# Patient Record
Sex: Female | Born: 1935 | Race: White | Hispanic: No | Smoking: Former smoker
Health system: Southern US, Community
[De-identification: ages and names within clinical notes are randomized; demographics above are authoritative.]

## PROBLEM LIST (undated history)

## (undated) DIAGNOSIS — D649 Anemia, unspecified: Secondary | ICD-10-CM

## (undated) DIAGNOSIS — J449 Chronic obstructive pulmonary disease, unspecified: Secondary | ICD-10-CM

## (undated) DIAGNOSIS — K219 Gastro-esophageal reflux disease without esophagitis: Secondary | ICD-10-CM

## (undated) DIAGNOSIS — K634 Enteroptosis: Secondary | ICD-10-CM

## (undated) DIAGNOSIS — I7300 Raynaud's syndrome without gangrene: Secondary | ICD-10-CM

## (undated) DIAGNOSIS — F419 Anxiety disorder, unspecified: Secondary | ICD-10-CM

## (undated) DIAGNOSIS — C3410 Malignant neoplasm of upper lobe, unspecified bronchus or lung: Secondary | ICD-10-CM

## (undated) HISTORY — DX: Anemia, unspecified: D64.9

## (undated) HISTORY — DX: Chronic obstructive pulmonary disease, unspecified: J44.9

## (undated) HISTORY — PX: JOINT REPLACEMENT: SHX530

## (undated) HISTORY — DX: Malignant neoplasm of upper lobe, unspecified bronchus or lung: C34.10

## (undated) HISTORY — PX: ABDOMINAL HYSTERECTOMY: SHX81

## (undated) HISTORY — DX: Raynaud's syndrome without gangrene: I73.00

## (undated) HISTORY — PX: CHOLECYSTECTOMY: SHX55

---

## 1995-12-08 DIAGNOSIS — C3410 Malignant neoplasm of upper lobe, unspecified bronchus or lung: Secondary | ICD-10-CM

## 1995-12-08 HISTORY — PX: LUNG SURGERY: SHX703

## 1995-12-08 HISTORY — DX: Malignant neoplasm of upper lobe, unspecified bronchus or lung: C34.10

## 2005-04-03 ENCOUNTER — Ambulatory Visit: Payer: Self-pay

## 2006-01-05 ENCOUNTER — Ambulatory Visit: Payer: Self-pay | Admitting: Specialist

## 2007-01-18 ENCOUNTER — Ambulatory Visit: Payer: Self-pay | Admitting: Internal Medicine

## 2007-07-28 ENCOUNTER — Ambulatory Visit: Payer: Self-pay | Admitting: Rheumatology

## 2007-08-11 ENCOUNTER — Ambulatory Visit: Payer: Self-pay | Admitting: Gastroenterology

## 2008-12-01 ENCOUNTER — Inpatient Hospital Stay: Payer: Self-pay | Admitting: Specialist

## 2009-03-07 ENCOUNTER — Ambulatory Visit: Payer: Self-pay | Admitting: Oncology

## 2009-03-13 ENCOUNTER — Ambulatory Visit: Payer: Self-pay | Admitting: Internal Medicine

## 2009-04-06 ENCOUNTER — Ambulatory Visit: Payer: Self-pay | Admitting: Oncology

## 2010-03-18 ENCOUNTER — Ambulatory Visit: Payer: Self-pay | Admitting: Internal Medicine

## 2010-06-11 ENCOUNTER — Ambulatory Visit: Payer: Self-pay | Admitting: Internal Medicine

## 2011-08-14 ENCOUNTER — Emergency Department: Payer: Self-pay | Admitting: Emergency Medicine

## 2012-08-23 ENCOUNTER — Ambulatory Visit: Payer: Self-pay | Admitting: Hematology and Oncology

## 2012-08-25 ENCOUNTER — Ambulatory Visit: Payer: Self-pay | Admitting: Rheumatology

## 2012-08-30 ENCOUNTER — Ambulatory Visit: Payer: Self-pay | Admitting: Hematology and Oncology

## 2012-08-30 LAB — CBC CANCER CENTER
Basophil %: 1 %
Eosinophil %: 2 %
MCH: 35 pg — ABNORMAL HIGH (ref 26.0–34.0)
MCHC: 32 g/dL (ref 32.0–36.0)
Monocyte #: 0.6 x10 3/mm (ref 0.2–0.9)
Neutrophil #: 6.3 x10 3/mm (ref 1.4–6.5)
Neutrophil %: 67.3 %
Platelet: 165 x10 3/mm (ref 150–440)
RBC: 3.36 10*6/uL — ABNORMAL LOW (ref 3.80–5.20)
RDW: 13.7 % (ref 11.5–14.5)

## 2012-08-30 LAB — BASIC METABOLIC PANEL
Anion Gap: 7 (ref 7–16)
BUN: 27 mg/dL — ABNORMAL HIGH (ref 7–18)
Chloride: 100 mmol/L (ref 98–107)
Co2: 32 mmol/L (ref 21–32)
Creatinine: 1.11 mg/dL (ref 0.60–1.30)
EGFR (African American): 53 — ABNORMAL LOW
EGFR (Non-African Amer.): 46 — ABNORMAL LOW
Osmolality: 283 (ref 275–301)

## 2012-09-01 LAB — PROT IMMUNOELECTROPHORES(ARMC)

## 2012-09-06 ENCOUNTER — Ambulatory Visit: Payer: Self-pay | Admitting: Hematology and Oncology

## 2013-01-04 ENCOUNTER — Ambulatory Visit: Payer: Self-pay | Admitting: Hematology and Oncology

## 2013-01-07 ENCOUNTER — Ambulatory Visit: Payer: Self-pay | Admitting: Hematology and Oncology

## 2013-01-11 LAB — FERRITIN: Ferritin (ARMC): 179 ng/mL (ref 8–388)

## 2013-02-04 ENCOUNTER — Ambulatory Visit: Payer: Self-pay | Admitting: Hematology and Oncology

## 2013-10-22 ENCOUNTER — Inpatient Hospital Stay: Payer: Self-pay | Admitting: Internal Medicine

## 2013-10-22 LAB — CK TOTAL AND CKMB (NOT AT ARMC)
CK, Total: 58 U/L (ref 21–215)
CK, Total: 47 U/L (ref 21–215)
CK, Total: 51 U/L (ref 21–215)
CK-MB: 0.9 ng/mL (ref 0.5–3.6)
CK-MB: 1.4 ng/mL (ref 0.5–3.6)
CK-MB: 1.1 ng/mL (ref 0.5–3.6)

## 2013-10-22 LAB — COMPREHENSIVE METABOLIC PANEL
Alkaline Phosphatase: 98 U/L (ref 50–136)
Anion Gap: 3 — ABNORMAL LOW (ref 7–16)
Calcium, Total: 9.3 mg/dL (ref 8.5–10.1)
Chloride: 100 mmol/L (ref 98–107)
Co2: 34 mmol/L — ABNORMAL HIGH (ref 21–32)
EGFR (African American): >60
EGFR (Non-African Amer.): >60
Osmolality: 282 (ref 275–301)
Potassium: 4.3 mmol/L (ref 3.5–5.1)
SGPT (ALT): 23 U/L (ref 12–78)
Sodium: 137 mmol/L (ref 136–145)
Total Protein: 6.9 g/dL (ref 6.4–8.2)

## 2013-10-22 LAB — TROPONIN I
Troponin-I: 0.03 ng/mL
Troponin-I: 0.02 ng/mL

## 2013-10-22 LAB — CBC
Platelet: 180 10*3/uL (ref 150–440)
RBC: 2.45 10*6/uL — ABNORMAL LOW (ref 3.80–5.20)
WBC: 15.8 10*3/uL — ABNORMAL HIGH (ref 3.6–11.0)

## 2013-10-22 LAB — PRO B NATRIURETIC PEPTIDE: B-Type Natriuretic Peptide: 739 pg/mL — ABNORMAL HIGH (ref 0–450)

## 2013-10-22 LAB — APTT: Activated PTT: 38.4 secs — ABNORMAL HIGH (ref 23.6–35.9)

## 2013-10-23 LAB — CBC WITH DIFFERENTIAL/PLATELET
Basophil #: 0.1 10*3/uL (ref 0.0–0.1)
Basophil %: 0.6 %
Eosinophil #: 0 10*3/uL (ref 0.0–0.7)
Eosinophil %: 0 %
HCT: 23.3 % — ABNORMAL LOW (ref 35.0–47.0)
Lymphocyte #: 0.6 10*3/uL — ABNORMAL LOW (ref 1.0–3.6)
Lymphocyte %: 4.8 %
MCH: 35.8 pg — ABNORMAL HIGH (ref 26.0–34.0)
MCHC: 33 g/dL (ref 32.0–36.0)
MCV: 108 fL — ABNORMAL HIGH (ref 80–100)
Monocyte #: 0.2 x10 3/mm (ref 0.2–0.9)
Monocyte %: 1.7 %
Neutrophil #: 11.9 10*3/uL — ABNORMAL HIGH (ref 1.4–6.5)
Neutrophil %: 92.9 %
Platelet: 165 10*3/uL (ref 150–440)
RBC: 2.15 10*6/uL — ABNORMAL LOW (ref 3.80–5.20)

## 2013-10-23 LAB — BASIC METABOLIC PANEL
Anion Gap: 4 — ABNORMAL LOW (ref 7–16)
Chloride: 99 mmol/L (ref 98–107)
Co2: 32 mmol/L (ref 21–32)
Creatinine: 0.89 mg/dL (ref 0.60–1.30)
EGFR (African American): >60
EGFR (Non-African Amer.): 59 — ABNORMAL LOW
Glucose: 202 mg/dL — ABNORMAL HIGH (ref 65–99)
Osmolality: 283 (ref 275–301)
Potassium: 3.9 mmol/L (ref 3.5–5.1)
Sodium: 135 mmol/L — ABNORMAL LOW (ref 136–145)

## 2013-10-24 LAB — CBC WITH DIFFERENTIAL/PLATELET
Basophil #: 0 10*3/uL (ref 0.0–0.1)
Basophil %: 0.2 %
Eosinophil #: 0 10*3/uL (ref 0.0–0.7)
Eosinophil %: 0 %
HCT: 22.7 % — ABNORMAL LOW (ref 35.0–47.0)
HGB: 7.6 g/dL — ABNORMAL LOW (ref 12.0–16.0)
Lymphocyte #: 0.8 10*3/uL — ABNORMAL LOW (ref 1.0–3.6)
MCV: 106 fL — ABNORMAL HIGH (ref 80–100)
Monocyte %: 2.5 %
Neutrophil #: 22.9 10*3/uL — ABNORMAL HIGH (ref 1.4–6.5)
Neutrophil %: 94 %
WBC: 24.3 10*3/uL — ABNORMAL HIGH (ref 3.6–11.0)

## 2013-10-24 LAB — BASIC METABOLIC PANEL
Calcium, Total: 8.7 mg/dL (ref 8.5–10.1)
Chloride: 97 mmol/L — ABNORMAL LOW (ref 98–107)
EGFR (African American): >60
EGFR (Non-African Amer.): >60
Potassium: 4.2 mmol/L (ref 3.5–5.1)
Sodium: 132 mmol/L — ABNORMAL LOW (ref 136–145)

## 2013-10-25 LAB — CBC WITH DIFFERENTIAL/PLATELET
Basophil %: 0.4 %
Eosinophil #: 0 10*3/uL (ref 0.0–0.7)
Eosinophil %: 0.1 %
HCT: 23.3 % — ABNORMAL LOW (ref 35.0–47.0)
MCH: 35.3 pg — ABNORMAL HIGH (ref 26.0–34.0)
MCHC: 33 g/dL (ref 32.0–36.0)
MCV: 107 fL — ABNORMAL HIGH (ref 80–100)
Monocyte #: 1 x10 3/mm — ABNORMAL HIGH (ref 0.2–0.9)
Platelet: 172 10*3/uL (ref 150–440)
RBC: 2.18 10*6/uL — ABNORMAL LOW (ref 3.80–5.20)
RDW: 13.8 % (ref 11.5–14.5)
WBC: 17.2 10*3/uL — ABNORMAL HIGH (ref 3.6–11.0)

## 2013-10-25 LAB — BASIC METABOLIC PANEL
Anion Gap: 5 — ABNORMAL LOW (ref 7–16)
Chloride: 98 mmol/L (ref 98–107)
Co2: 31 mmol/L (ref 21–32)
EGFR (Non-African Amer.): >60
Glucose: 109 mg/dL — ABNORMAL HIGH (ref 65–99)
Sodium: 134 mmol/L — ABNORMAL LOW (ref 136–145)

## 2013-10-26 LAB — CBC WITH DIFFERENTIAL/PLATELET
Basophil #: 0 10*3/uL (ref 0.0–0.1)
Eosinophil #: 0 10*3/uL (ref 0.0–0.7)
Eosinophil %: 0 %
Lymphocyte #: 0.8 10*3/uL — ABNORMAL LOW (ref 1.0–3.6)
MCV: 105 fL — ABNORMAL HIGH (ref 80–100)
Monocyte #: 0.8 x10 3/mm (ref 0.2–0.9)
Neutrophil #: 11.7 10*3/uL — ABNORMAL HIGH (ref 1.4–6.5)
RDW: 13.6 % (ref 11.5–14.5)
WBC: 13.4 10*3/uL — ABNORMAL HIGH (ref 3.6–11.0)

## 2013-10-26 LAB — BASIC METABOLIC PANEL
Calcium, Total: 8.9 mg/dL (ref 8.5–10.1)
Chloride: 97 mmol/L — ABNORMAL LOW (ref 98–107)
EGFR (African American): >60
EGFR (Non-African Amer.): 60 — ABNORMAL LOW
Glucose: 113 mg/dL — ABNORMAL HIGH (ref 65–99)
Osmolality: 273 (ref 275–301)
Potassium: 4.3 mmol/L (ref 3.5–5.1)
Sodium: 133 mmol/L — ABNORMAL LOW (ref 136–145)

## 2013-10-27 LAB — CULTURE, BLOOD (SINGLE)

## 2014-07-20 ENCOUNTER — Ambulatory Visit: Payer: Self-pay | Admitting: Vascular Surgery

## 2014-08-29 ENCOUNTER — Ambulatory Visit: Payer: Self-pay | Admitting: Vascular Surgery

## 2014-08-29 LAB — CBC
HCT: 25.8 % — AB (ref 35.0–47.0)
HGB: 8.7 g/dL — ABNORMAL LOW (ref 12.0–16.0)
MCH: 37.9 pg — ABNORMAL HIGH (ref 26.0–34.0)
MCHC: 33.8 g/dL (ref 32.0–36.0)
MCV: 112 fL — ABNORMAL HIGH (ref 80–100)
Platelet: 137 10*3/uL — ABNORMAL LOW (ref 150–440)
RBC: 2.3 10*6/uL — ABNORMAL LOW (ref 3.80–5.20)
RDW: 13.7 % (ref 11.5–14.5)
WBC: 10.4 10*3/uL (ref 3.6–11.0)

## 2014-08-29 LAB — APTT: Activated PTT: 29.1 secs (ref 23.6–35.9)

## 2014-08-29 LAB — URINALYSIS, COMPLETE
BLOOD: NEGATIVE
Bilirubin,UR: NEGATIVE
Glucose,UR: NEGATIVE mg/dL (ref 0–75)
Ketone: NEGATIVE
Nitrite: NEGATIVE
Ph: 7 (ref 4.5–8.0)
Protein: NEGATIVE
RBC,UR: 2 /HPF (ref 0–5)
SPECIFIC GRAVITY: 1.018 (ref 1.003–1.030)
Squamous Epithelial: 2

## 2014-08-29 LAB — MRSA PCR SCREENING

## 2014-08-29 LAB — PROTIME-INR
INR: 1.1
PROTHROMBIN TIME: 13.7 s (ref 11.5–14.7)

## 2014-08-29 LAB — BASIC METABOLIC PANEL
Anion Gap: 3 — ABNORMAL LOW (ref 7–16)
BUN: 33 mg/dL — AB (ref 7–18)
Calcium, Total: 8.7 mg/dL (ref 8.5–10.1)
Chloride: 101 mmol/L (ref 98–107)
Co2: 33 mmol/L — ABNORMAL HIGH (ref 21–32)
Creatinine: 0.95 mg/dL (ref 0.60–1.30)
EGFR (Non-African Amer.): 59 — ABNORMAL LOW
Glucose: 86 mg/dL (ref 65–99)
OSMOLALITY: 280 (ref 275–301)
Potassium: 4.5 mmol/L (ref 3.5–5.1)
Sodium: 137 mmol/L (ref 136–145)

## 2014-08-29 LAB — SEDIMENTATION RATE: Erythrocyte Sed Rate: 74 mm/hr — ABNORMAL HIGH (ref 0–30)

## 2014-09-05 ENCOUNTER — Inpatient Hospital Stay: Payer: Self-pay | Admitting: Vascular Surgery

## 2014-09-06 LAB — CBC WITH DIFFERENTIAL/PLATELET
Basophil #: 0.1 10*3/uL (ref 0.0–0.1)
Basophil %: 0.9 %
EOS PCT: 0.8 %
Eosinophil #: 0.1 10*3/uL (ref 0.0–0.7)
HCT: 21.3 % — ABNORMAL LOW (ref 35.0–47.0)
HGB: 7 g/dL — ABNORMAL LOW (ref 12.0–16.0)
Lymphocyte #: 1.3 10*3/uL (ref 1.0–3.6)
Lymphocyte %: 14.9 %
MCH: 37.3 pg — AB (ref 26.0–34.0)
MCHC: 33 g/dL (ref 32.0–36.0)
MCV: 113 fL — ABNORMAL HIGH (ref 80–100)
Monocyte #: 1 x10 3/mm — ABNORMAL HIGH (ref 0.2–0.9)
Monocyte %: 11.2 %
NEUTROS PCT: 72.2 %
Neutrophil #: 6.4 10*3/uL (ref 1.4–6.5)
Platelet: 125 10*3/uL — ABNORMAL LOW (ref 150–440)
RBC: 1.89 10*6/uL — ABNORMAL LOW (ref 3.80–5.20)
RDW: 13.8 % (ref 11.5–14.5)
WBC: 8.9 10*3/uL (ref 3.6–11.0)

## 2014-09-06 LAB — BASIC METABOLIC PANEL
ANION GAP: 5 — AB (ref 7–16)
BUN: 22 mg/dL — ABNORMAL HIGH (ref 7–18)
CHLORIDE: 103 mmol/L (ref 98–107)
CO2: 31 mmol/L (ref 21–32)
Calcium, Total: 7.8 mg/dL — ABNORMAL LOW (ref 8.5–10.1)
Creatinine: 0.79 mg/dL (ref 0.60–1.30)
EGFR (African American): >60
Glucose: 85 mg/dL (ref 65–99)
OSMOLALITY: 280 (ref 275–301)
Potassium: 4.2 mmol/L (ref 3.5–5.1)
Sodium: 139 mmol/L (ref 136–145)

## 2014-09-06 LAB — APTT: Activated PTT: 29.5 secs (ref 23.6–35.9)

## 2014-09-06 LAB — PROTIME-INR
INR: 1.2
PROTHROMBIN TIME: 14.9 s — AB (ref 11.5–14.7)

## 2014-09-07 LAB — PATHOLOGY REPORT

## 2015-03-29 NOTE — H&P (Signed)
PATIENT NAME:  Sara Mccoy, Sara Mccoy MR#:  [id number] DATE OF BIRTH:  [birth year]  DATE OF ADMISSION:  10/22/2013  REFERRING PHYSICIAN: Dr. Jacqualine Code.   FAMILY PHYSICIAN: Dr. Ginette Pitman   REASON FOR ADMISSION: Chest pain with shortness of breath.   HISTORY OF PRESENT ILLNESS: The patient is an 81 year old female with a history of scleroderma, chronic obstructive pulmonary disease and previous lung cancer. Presents to the Emergency Room with chest pain described as tightness associated with worsening shortness of breath. In the Emergency Room, the patient's EKG showed no acute changes. Her troponin was 0.03. She was given 3 SVNs with slight improvement of her symptoms. Chest x-ray suggested a left lower lobe pneumonia. She is now admitted for further evaluation.   PAST MEDICAL HISTORY: 1. Chronic obstructive pulmonary disease/asthma. 2. Lung cancer, status post right upper lobectomy.  3. Anemia of chronic disease.  4. Borderline diabetes. 5. Benign hypertension.  6. Carpal tunnel syndrome.  7. Scleroderma.  8. Osteoporosis.  9. Hyperlipidemia.  10. History of reflux esophagitis.  11. Status post hysterectomy.  12. Status post Nissan fundoplication.   MEDICATIONS: 1. Ambien 5 mg p.o. at bedtime.  2. Vitamin D3 1000 units p.o. daily.  3. Effexor-XR 75 mg p.o. daily.  4. Percocet 5/325, one p.o. t.i.d.  5. Prilosec 40 mg p.o. daily.  6. Lodine 400 mg p.o. b.i.d.  7. BuSpar 10 mg p.o. t.i.d.  8. Advair 250/50 1 puff b.i.d.  9. Norvasc 5 mg p.o. daily.   ALLERGIES: METHOTREXATE.   SOCIAL HISTORY: Negative for alcohol or tobacco abuse.   FAMILY HISTORY: Positive for stroke, but otherwise unremarkable.   REVIEW OF SYSTEMS:  CONSTITUTIONAL: No fever or change in weight.  EYES: No blurred or double vision. No glaucoma.  ENT: No tinnitus or hearing loss. No nasal discharge or bleeding. No difficulty swallowing.  RESPIRATORY: The patient has had cough and wheezing. No hemoptysis. No painful  respiration.  CARDIOVASCULAR: No orthopnea or palpitations. No syncope.  GASTROINTESTINAL: No nausea, vomiting, or diarrhea. No abdominal pain. No change in bowel habits.  GENITOURINARY: No dysuria or hematuria. No incontinence.  ENDOCRINE: No polyuria or polydipsia. No heat or cold intolerance.  HEMATOLOGIC: The patient admits to anemia but denies easy bruising or bleeding.  LYMPHATIC: No swollen glands.  MUSCULOSKELETAL: The patient denies pain in her neck, back, shoulders, knees, or hips. No gout.  NEUROLOGIC: No numbness or migraines. Denies stroke or seizures.  PSYCHIATRIC: The patient denies anxiety or depression. Does have issues with insomnia.   PHYSICAL EXAMINATION: GENERAL: The patient is chronically ill appearing, in no acute distress.  VITAL SIGNS: Vital signs are currently remarkable for a blood pressure of 112/62 with a heart rate of 82 and a respiratory rate of 20, with a temperature of 97.9. Sat is 100% on 2 liters.  HEENT: Normocephalic, atraumatic. Pupils equally round and reactive to light and accommodation. Extraocular movements are intact. Sclerae are nonicteric. Conjunctivae are clear.  OROPHARYNX: Dry, but clear.  NECK: Supple without JVD or bruits. No adenopathy or thyromegaly is noted.   LUNGS: Revealed scattered wheezes with rhonchi noted at the left base. No rales. Respiratory effort is mildly increased.  CARDIAC: Regular rate and rhythm. Normal S1, S2. No significant rubs, murmurs or gallops. PMI is nondisplaced. Chest wall is nontender.  ABDOMEN: Soft, nontender, with normoactive bowel sounds. No organomegaly or masses were appreciated. No hernias or bruits were noted.  EXTREMITIES: Without clubbing, cyanosis, or edema..  Pulses were 2+ bilaterally.  SKIN: Warm and  dry without rash or lesions.  NEUROLOGIC: Cranial nerves II through XII grossly intact. Deep tendon reflexes were symmetric. Motor and sensory exam is nonfocal.  PSYCHIATRIC: Revealed a patient who was  alert and oriented to person, place, and time. She was cooperative and used good judgment.   LABORATORY DATA: EKG revealed sinus rhythm with no acute ischemic changes. Chest x-ray revealed left lower lobe infiltrate consistent with pneumonia. White count was 15.8 with a hemoglobin of 8.9. Glucose was 89 with a BUN of 36 and a creatinine of 0.86 with a GFR of greater than 60. Sodium 137, with a potassium of 4.3. Troponin was 0.03.   ASSESSMENT: 1. Acute on chronic respiratory failure.  2. Chronic obstructive pulmonary disease exacerbation.  3. Pneumonia.  4. Chest pain, worrisome for new onset angina.  5. Anemia of chronic disease.  6. Scleroderma.   PLAN: The patient will be observed with off unit telemetry on oxygen, SVNs, IV steroids and IV antibiotics. We will follow serial cardiac enzymes and obtain an echocardiogram. We will follow up routine labs and a chest x-ray in the morning. We will follow her sugars while on steroids. We will continue most of her outpatient medications at this time. Soft diet for now. Further treatment and evaluation will depend upon the patient's progress.   TOTAL TIME SPENT ON THIS PATIENT: 45 minutes.   ____________________________ Leonie Douglas Doy Hutching, MD jds:sg D: 10/22/2013 13:27:00 ET T: 10/22/2013 13:53:38 ET JOB#: [id number]  cc: Leonie Douglas. Doy Hutching, MD, <Dictator> Tracie Harrier, MD  Veneda Kirksey Lennice Sites MD ELECTRONICALLY SIGNED 10/23/2013 8:03

## 2015-03-29 NOTE — Discharge Summary (Signed)
PATIENT NAME:  Sara Mccoy, Sara Mccoy MR#:  [id number] DATE OF BIRTH:  [birth year]  DATE OF ADMISSION:  10/22/2013 DATE OF DISCHARGE:  10/26/2013  DISCHARGE DIAGNOSIS:  1.  Acute on chronic respiratory failure. 2.  Chronic obstructive pulmonary disease exacerbation.  3.  Pneumonia.  4.  Scleroderma.  5.  Hypertension.  6.  Anxiety.   CHIEF COMPLAINT: Shortness of breath, chest pain.   HISTORY OF PRESENT ILLNESS: Sara Mccoy is an 81 year old female with a history scleroderma, COPD, and previous lung cancer who presented to the ER complaining of chest pain associated with tightness, worsening shortness of breath, and cough with productive sputum. Chest x-ray was suggestive of left lower lobe pneumonia. She was given SVNs and she improved to some extent.  PAST MEDICAL HISTORY: Significant for COPD, lung cancer status post right upper lobectomy, anemia of chronic disease, borderline diabetes, benign hypertension, carpal tunnel syndrome, scleroderma, osteoporosis, hyperlipidemia, history of reflux esophagitis status post Nissen fundoplication, and history of previous hysterectomy.   PHYSICAL EXAMINATION: The patient was mildly dyspneic at rest. Blood pressure 112/62, heart rate 82, respirations 20, temperature was 97.9, and O2 sat 100% on 2 liters nasal cannula. Pupils were equal and reactive to light. Oropharynx was clear. Neck was supple. No JVD. Heart S1, S2. Lungs with bilateral inspiratory and expiratory wheezes with crackles in the left base. Abdomen was soft, nontender. Extremities no edema. Neurologically nonfocal.  LABORATORY AND DIAGNOSTICS: EKG revealed sinus rhythm, no acute ischemic changes. Chest x-ray showed left lower lobe infiltrate consistent with pneumonia, WBC count 15.8, hemoglobin 8.9. Glucose 89, BUN 36, creatinine 0.86. GFR greater than 60. Sodium 137, potassium 4.3. Troponin was 0.03.   HOSPITAL COURSE: The patient was admitted to [facility] and received supplemental oxygen,  SVNs, IV steroids, and IV antibiotics. Serial cardiac enzymes were also obtained. She was started on intravenous Rocephin and Zithromax. During her stay in the hospital, her cardiac enzymes were negative. White cell count did go up as high as 24.3, which subsequently started to trend downwards. Her steroids were gradually tapered. She was seen by physical therapy. She was advised to have home health nursing and discharged in stable condition on the following medications.   DISCHARGE MEDICATIONS: 1.  Albuterol nebulizer 2.5 mg/3 mL 1 vial 3 to 4 times a day as needed.  2.  Ceftin 250 mg p.o. b.i.d. for 7 days. 3.  Prednisone taper starting at 40 mg a day for 3 days, decrease x 10 mg every 3 days until gone. 4.  Zolpidem 5 mg at bedtime p.r.n.  5.  Advair Diskus 250/50 one puff b.i.d. 6.  Omeprazole 40 mg once a day.  7.  Calcium plus D 1 tablet once a day. 8.  Centrum Silver 1 capsule once a day. 9.  Hydrocodone 1 tablet p.o. b.i.d. p.r.n. for pain.  10.  Vitamin D3 1000 units once a day. 11.  Vitamin E 400 international units once a day. 12.  B12 500 mcg sublingual once a day. 13.  Venlafaxine 75 mg once a day. 14.  Amlodipine 5 mg once a day. 15.  Guaifenesin 600 mg p.o. b.i.d. p.r.n.   DISCHARGE INSTRUCTIONS: Also advised home oxygen 2 liters nasal cannula. Home health nursing was arranged and she is to follow up with me, Dr. Ginette Pitman, in 2 to 4 weeks time.    Total time spent in discharge of this pt: 35 minutes ____________________________ Tracie Harrier, MD vh:sb D: 10/26/2013 13:03:37 ET T: 10/26/2013 13:16:40 ET JOB#: [id number]  cc:  Tracie Harrier, MD, <Dictator> Tracie Harrier MD ELECTRONICALLY SIGNED 10/27/2013 13:22

## 2015-03-30 NOTE — Op Note (Signed)
PATIENT NAME:  Sara Mccoy, Sara Mccoy MR#:  [id number] DATE OF BIRTH:  12-Jan-1926  DATE OF PROCEDURE:  09/05/2014  PREOPERATIVE DIAGNOSES: 1. Symptomatic right internal carotid artery stenosis.  2. Amaurosis fugax, right eye.   POSTOPERATIVE DIAGNOSES:  1. Symptomatic right internal carotid artery stenosis.  2. Amaurosis fugax, right eye.   PROCEDURE PERFORMED: 1. Right carotid endarterectomy with CorMatrix patch angioplasty.  2. Repair of arterial defect using xenograft Corometrics patch.   SURGEON:   Katha Cabal, M.D.  ANESTHESIA: General by endotracheal intubation.   FLUIDS: Per anesthesia record.   ESTIMATED BLOOD LOSS: 50 mL.   SPECIMEN: Carotid plaque to pathology for permanent section.   INDICATIONS: Sara Mccoy is an 81 year old woman who has had multiple symptomatic episodes attributable to her right internal carotid artery. Duplex ultrasound and subsequently CT angiography demonstrated a greater than 90% stenosis of the right internal carotid artery and she is therefore undergoing endarterectomy for prevention of disabling stroke. Risks and benefits have been reviewed. All questions answered. The patient agrees to proceed.   DESCRIPTION OF PROCEDURE: The patient was taken to the operating room and placed in the supine position. After adequate general anesthesia was induced and appropriate invasive monitors were placed, she was positioned supine with the neck extended slightly and rotated to the left. Right neck and chest wall were then prepped and draped in a sterile fashion.   Appropriate timeout was called.   A curvilinear incision was then created along the anterior margin of the sternocleidomastoid muscle and carried down through the skin transecting the platysma ligating external jugular vein with 2-0 silk ties. The sternocleidomastoid muscle was then reflected posterolaterally and at the level of the omohyoid muscle the common carotid artery was identified.  Dissection was then carried in a craniad direction, ligating tributaries between 2-0 and 3-0 silk ties. Facial vein was ligated and divided between two 2-0 silk ties. Vagus nerve was identified as was the hypoglossal. Circumferential dissection was carried out around the superior thyroidal artery and then the external carotid artery. Internal carotid artery was dissected to a level above visible plaque formation. Then 7000 units of heparin was given and allowed to circulate for 5 minutes.   The common carotid, followed by the external, followed by the internal carotid artery was then clamped. Arteriotomy was made, extended with Potts scissors, and an indwelling Sundt shunt was placed and flow was re-established to the brain.   Endarterectomy was then performed under direct visualization for the distal common bulb and internal carotid artery. The external carotid artery was treated with the eversion technique. Interrupted 7-0 Prolene sutures were used to tack the distal intimal edge within the internal carotid artery and at this time the internal carotid artery was plicated by approximately 5 mm.   CorMatrix patches opened on the back table and rehydrated.  It was then applied to fix the arterial defect as a patch using running 6-0 Prolene in a 4-quadrant technique. The surgical site was irrigated copiously with heparin throughout the application of the patch. The shunt was Removed, the suture line was completed, and subsequently flow was established, first to the external carotid artery and then the internal carotid artery to prevent distal embolization. Suture line was then inspected. Surgicel was placed. Evicel was placed in the posterior wound layer and the wound was closed after irrigation using 3-0 Vicryl in a running fashion for the platysma and 4-0 Monocryl subcuticular for the skin. Dermabond is applied.   The patient tolerated the procedure  well. She was awakened in the operating room following  simple commands, moving all extremities and she was taken to the recovery area in stable condition.    ____________________________ Katha Cabal, MD ggs:hh D: 09/05/2014 16:05:00 ET T: 09/05/2014 21:17:02 ET JOB#: [id number]  cc: Katha Cabal, MD, <Dictator> Katha Cabal MD ELECTRONICALLY SIGNED 09/24/2014 20:45

## 2016-01-27 ENCOUNTER — Inpatient Hospital Stay: Payer: PPO | Attending: Internal Medicine | Admitting: Internal Medicine

## 2016-01-27 ENCOUNTER — Inpatient Hospital Stay: Payer: PPO

## 2016-01-27 ENCOUNTER — Encounter: Payer: Self-pay | Admitting: Internal Medicine

## 2016-01-27 VITALS — BP 109/64 | HR 77 | Temp 97.9°F | Resp 18 | Ht 61.0 in | Wt 143.3 lb

## 2016-01-27 DIAGNOSIS — Z85118 Personal history of other malignant neoplasm of bronchus and lung: Secondary | ICD-10-CM | POA: Diagnosis not present

## 2016-01-27 DIAGNOSIS — I7300 Raynaud's syndrome without gangrene: Secondary | ICD-10-CM | POA: Insufficient documentation

## 2016-01-27 DIAGNOSIS — D539 Nutritional anemia, unspecified: Secondary | ICD-10-CM

## 2016-01-27 DIAGNOSIS — R5383 Other fatigue: Secondary | ICD-10-CM | POA: Diagnosis not present

## 2016-01-27 DIAGNOSIS — Z9981 Dependence on supplemental oxygen: Secondary | ICD-10-CM | POA: Insufficient documentation

## 2016-01-27 DIAGNOSIS — Z87891 Personal history of nicotine dependence: Secondary | ICD-10-CM | POA: Diagnosis not present

## 2016-01-27 DIAGNOSIS — D649 Anemia, unspecified: Secondary | ICD-10-CM | POA: Diagnosis not present

## 2016-01-27 DIAGNOSIS — R0602 Shortness of breath: Secondary | ICD-10-CM | POA: Insufficient documentation

## 2016-01-27 DIAGNOSIS — D472 Monoclonal gammopathy: Secondary | ICD-10-CM | POA: Diagnosis not present

## 2016-01-27 DIAGNOSIS — Z79899 Other long term (current) drug therapy: Secondary | ICD-10-CM | POA: Diagnosis not present

## 2016-01-27 DIAGNOSIS — J449 Chronic obstructive pulmonary disease, unspecified: Secondary | ICD-10-CM | POA: Diagnosis not present

## 2016-01-27 LAB — CBC WITH DIFFERENTIAL/PLATELET
BASOS ABS: 0.1 10*3/uL (ref 0–0.1)
BASOS PCT: 1 %
EOS PCT: 1 %
Eosinophils Absolute: 0.1 10*3/uL (ref 0–0.7)
HCT: 20.5 % — ABNORMAL LOW (ref 35.0–47.0)
Hemoglobin: 6.9 g/dL — ABNORMAL LOW (ref 12.0–16.0)
LYMPHS PCT: 19 %
Lymphs Abs: 1.6 10*3/uL (ref 1.0–3.6)
MCH: 38.3 pg — ABNORMAL HIGH (ref 26.0–34.0)
MCHC: 33.6 g/dL (ref 32.0–36.0)
MCV: 114 fL — AB (ref 80.0–100.0)
Monocytes Absolute: 0.7 10*3/uL (ref 0.2–0.9)
Monocytes Relative: 8 %
NEUTROS ABS: 6 10*3/uL (ref 1.4–6.5)
Neutrophils Relative %: 71 %
PLATELETS: 155 10*3/uL (ref 150–440)
RBC: 1.8 MIL/uL — AB (ref 3.80–5.20)
RDW: 15.2 % — ABNORMAL HIGH (ref 11.5–14.5)
WBC: 8.5 10*3/uL (ref 3.6–11.0)

## 2016-01-27 LAB — COMPREHENSIVE METABOLIC PANEL
ALBUMIN: 3.5 g/dL (ref 3.5–5.0)
ALK PHOS: 55 U/L (ref 38–126)
ALT: 17 U/L (ref 14–54)
ANION GAP: 3 — AB (ref 5–15)
AST: 19 U/L (ref 15–41)
BUN: 33 mg/dL — ABNORMAL HIGH (ref 6–20)
CALCIUM: 9.1 mg/dL (ref 8.9–10.3)
CHLORIDE: 97 mmol/L — AB (ref 101–111)
CO2: 33 mmol/L — AB (ref 22–32)
Creatinine, Ser: 1.1 mg/dL — ABNORMAL HIGH (ref 0.44–1.00)
GFR calc non Af Amer: 44 mL/min — ABNORMAL LOW (ref >60)
GFR, EST AFRICAN AMERICAN: 51 mL/min — AB (ref >60)
GLUCOSE: 117 mg/dL — AB (ref 65–99)
POTASSIUM: 4.8 mmol/L (ref 3.5–5.1)
SODIUM: 133 mmol/L — AB (ref 135–145)
Total Bilirubin: 0.8 mg/dL (ref 0.3–1.2)
Total Protein: 6.7 g/dL (ref 6.5–8.1)

## 2016-01-27 LAB — LACTATE DEHYDROGENASE: LDH: 166 U/L (ref 98–192)

## 2016-01-27 LAB — RETICULOCYTES
RBC.: 1.8 MIL/uL — AB (ref 3.80–5.20)
RETIC COUNT ABSOLUTE: 19.8 10*3/uL (ref 19.0–183.0)
RETIC CT PCT: 1.1 % (ref 0.4–3.1)

## 2016-01-27 LAB — SAMPLE TO BLOOD BANK

## 2016-01-27 LAB — PREPARE RBC (CROSSMATCH)

## 2016-01-27 LAB — ABO/RH: ABO/RH(D): O POS

## 2016-01-27 NOTE — Progress Notes (Signed)
Pt very tired, sob on exertion. No blood in stool or urine. Marland Kitchen She wears oxygen when she needs it. She does not bring it to appts because it is to heavy to bring and she feels that she does not need it and when she come to office she gets wheelchair.

## 2016-01-27 NOTE — Progress Notes (Signed)
[place] @ [facility] [phone]  Fax:([phone]) [place]: 03-14-1926  MR#: [id number]  [facility]#:[id number]  Patient Care Team: Tracie Harrier, MD as PCP - General (Internal Medicine)  CHIEF COMPLAINT:  Chief Complaint  Patient presents with  . Anemia     No history exists.    No flowsheet data found.  HISTORY OF PRESENT ILLNESS:   Mrs. Culbreth is an 82 year old female who was previously seen in our clinic, last time in 2014, who has a history of macrocytic anemia and monoclonal gammopathy of undetermined significance.a very extensive workup showed no evidence of advanced plasma cell dyscrasia, vitamin B12, folate, iron deficiency. There was a suggestion that she could have myelodysplastic syndrome, but no bone marrow biopsy was performed. Today she returns to our clinic with worsening shortness of breath, which significantly reduces her ability to perform the activities of daily living. On a regular basis she is able to perform self-care, cooking, light cleaning, but all the past several months she had been slowly declining due to worsening of shortness of breath and increasing fatigue.her vitamin B12, folate and ferritin levels are within normal range. She denies cough, chest pain, nausea, vomiting, bleeding from any source,night sweats, fever.she is only able to walk 50 feet, before she has to catch her breath. She uses oxygen at home.  REVIEW OF SYSTEMS:   ROS   PAST MEDICAL HISTORY: Past Medical History  Diagnosis Date  . COPD (chronic obstructive pulmonary disease) ([place])   . Anemia   . Raynaud's disease   . Lung cancer, upper lobe ([place]) 1997    right upper lobe    PAST SURGICAL HISTORY: Past Surgical History  Procedure Laterality Date  . Abdominal hysterectomy    . Lung surgery Right 1997    RUL removed    FAMILY HISTORY Family History  Problem Relation Age of Onset  . Diabetes Mother   . Heart attack Son   .  Hypertension Daughter     ADVANCED DIRECTIVES:  No flowsheet data found.  HEALTH MAINTENANCE: Social History  Substance Use Topics  . Smoking status: Former Smoker -- 2.00 packs/day for 46 years  . Smokeless tobacco: Never Used  . Alcohol Use: No     No Known Allergies  Current Outpatient Prescriptions  Medication Sig Dispense Refill  . ALPRAZolam (XANAX) 0.5 MG tablet Take 0.5 mg by mouth daily as needed for anxiety.    Marland Kitchen amLODipine (NORVASC) 5 MG tablet Take 5 mg by mouth daily.    . Ascorbic Acid (VITAMIN C) 1000 MG tablet Take 1,000 mg by mouth daily.    Marland Kitchen b complex vitamins capsule Take 1 capsule by mouth daily.    . Calcium Carb-Cholecalciferol (CALCIUM + D3) 600-200 MG-UNIT TABS Take 1 tablet by mouth 2 (two) times daily.    Marland Kitchen etodolac (LODINE) 400 MG tablet Take 400 mg by mouth 2 (two) times daily.    . Fluticasone-Salmeterol (ADVAIR) 500-50 MCG/DOSE AEPB Inhale 1 puff into the lungs 2 (two) times daily.    Marland Kitchen ipratropium-albuterol (DUONEB) 0.5-2.5 (3) MG/3ML SOLN Take 3 mLs by nebulization every 6 (six) hours as needed.    . Multiple Vitamin (MULTIVITAMIN WITH MINERALS) TABS tablet Take 1 tablet by mouth daily.    Marland Kitchen omeprazole (PRILOSEC) 40 MG capsule Take 40 mg by mouth daily.    Marland Kitchen oxyCODONE-acetaminophen (PERCOCET/ROXICET) 5-325 MG tablet Take by mouth 2 (two) times daily as needed for severe pain.    Marland Kitchen venlafaxine (  EFFEXOR) 37.5 MG tablet Take 37.5 mg by mouth 2 (two) times daily.    . vitamin E 400 UNIT capsule Take 400 Units by mouth daily.     No current facility-administered medications for this visit.    OBJECTIVE:  Filed Vitals:   01/27/16 1458  BP: 109/64  Pulse: 77  Temp: 97.9 F (36.6 C)  Resp: 18     Body mass index is 27.09 kg/(m^2).    ECOG FS:3 - Symptomatic, >50% confined to bed  Physical Exam BP 109/64 mmHg  Pulse 77  Temp(Src) 97.9 F (36.6 C) (Tympanic)  Resp 18  Ht _0  (1.549 m)  Wt 143 lb 4.8 oz (65 kg)  BMI 27.09 kg/m2  General  Appearance:    Alert, cooperative, mild respiratory distress distress, appears stated age.  Elderly Caucasian female, in a wheelchair  Head:    Normocephalic, without obvious abnormality, atraumatic  Eyes:    PERRL, conjunctiva/corneas clear, EOM's intact, fundi    benign, both eyes  Ears:    Normal TM's and external ear canals, both ears  Nose:   Nares normal, septum midline, mucosa normal, no drainage    or sinus tenderness  Throat:   Lips, mucosa, and tongue normal; teeth and gums normal  Neck:   Supple, symmetrical, trachea midline, no adenopathy;    thyroid:  no enlargement/tenderness/nodules; no carotid   bruit or JVD  Back:     Symmetric, no curvature, ROM normal, no CVA tenderness  Lungs:     Fair air entry bilaterally with few wheezes bilaterally   Chest Wall:    No tenderness or deformity   Heart:    Regular rate and rhythm, S1 and S2 normal, no murmur, rub   or gallop  Breast Exam:    No tenderness, masses, or nipple abnormality  Abdomen:     Soft, non-tender, bowel sounds active all four quadrants,    no masses, no organomegaly  Extremities:   Extremities normal, atraumatic, no cyanosis or edema  Pulses:   2+ and symmetric all extremities  Skin:   Skin color, texture, turgor normal, no rashes or lesions  Lymph nodes:   Cervical, supraclavicular, and axillary nodes normal  Neurologic:   CNII-XII intact, normal strength, sensation and reflexes    throughout     LAB RESULTS:  CBC Latest Ref Rng 09/06/2014 08/29/2014  WBC 3.6-11.0 x10 3/mm 3 8.9 10.4  Hemoglobin 12.0-16.0 g/dL 7.0(L) 8.7(L)  Hematocrit 35.0-47.0 % 21.3(L) 25.8(L)  Platelets 150-440 x10 3/mm 3 125(L) 137(L)    Appointment on 01/27/2016  Component Date Value Ref Range Status  . WBC 01/27/2016 8.5  3.6 - 11.0 K/uL Final  . RBC 01/27/2016 1.80* 3.80 - 5.20 MIL/uL Final  . Hemoglobin 01/27/2016 6.9* 12.0 - 16.0 g/dL Final  . HCT 01/27/2016 20.5* 35.0 - 47.0 % Final  . MCV 01/27/2016 114.0* 80.0 - 100.0 fL  Final  . MCH 01/27/2016 38.3* 26.0 - 34.0 pg Final  . MCHC 01/27/2016 33.6  32.0 - 36.0 g/dL Final  . RDW 01/27/2016 15.2* 11.5 - 14.5 % Final  . Platelets 01/27/2016 155  150 - 440 K/uL Final  . Neutrophils Relative % 01/27/2016 71   Final  . Neutro Abs 01/27/2016 6.0  1.4 - 6.5 K/uL Final  . Lymphocytes Relative 01/27/2016 19   Final  . Lymphs Abs 01/27/2016 1.6  1.0 - 3.6 K/uL Final  . Monocytes Relative 01/27/2016 8   Final  . Monocytes Absolute 01/27/2016 0.7  0.2 - 0.9 K/uL Final  . Eosinophils Relative 01/27/2016 1   Final  . Eosinophils Absolute 01/27/2016 0.1  0 - 0.7 K/uL Final  . Basophils Relative 01/27/2016 1   Final  . Basophils Absolute 01/27/2016 0.1  0 - 0.1 K/uL Final  . Sodium 01/27/2016 133* 135 - 145 mmol/L Final  . Potassium 01/27/2016 4.8  3.5 - 5.1 mmol/L Final  . Chloride 01/27/2016 97* 101 - 111 mmol/L Final  . CO2 01/27/2016 33* 22 - 32 mmol/L Final  . Glucose, Bld 01/27/2016 117* 65 - 99 mg/dL Final  . BUN 01/27/2016 33* 6 - 20 mg/dL Final  . Creatinine, Ser 01/27/2016 1.10* 0.44 - 1.00 mg/dL Final  . Calcium 01/27/2016 9.1  8.9 - 10.3 mg/dL Final  . Total Protein 01/27/2016 6.7  6.5 - 8.1 g/dL Final  . Albumin 01/27/2016 3.5  3.5 - 5.0 g/dL Final  . AST 01/27/2016 19  15 - 41 U/L Final  . ALT 01/27/2016 17  14 - 54 U/L Final  . Alkaline Phosphatase 01/27/2016 55  38 - 126 U/L Final  . Total Bilirubin 01/27/2016 0.8  0.3 - 1.2 mg/dL Final  . GFR calc non Af Amer 01/27/2016 44* >60 mL/min Final  . GFR calc Af Amer 01/27/2016 51* >60 mL/min Final   Comment: (NOTE) The eGFR has been calculated using the CKD EPI equation. This calculation has not been validated in all clinical situations. eGFR's persistently <60 mL/min signify possible Chronic Kidney Disease.   . Anion gap 01/27/2016 3* 5 - 15 Final  . LDH 01/27/2016 166  98 - 192 U/L Final  . Retic Ct Pct 01/27/2016 1.1  0.4 - 3.1 % Final  . RBC. 01/27/2016 1.80* 3.80 - 5.20 MIL/uL Final  . Retic  Count, Manual 01/27/2016 19.8  19.0 - 183.0 K/uL Final        STUDIES: No results found.  ASSESSMENT and MEDICAL DECISION MAKING:  Macrocytic anemia-patient was previously seen in our clinic for a similar problem. The workup showed a minimal monoclonal gammopathy of undetermined significance, normal vitamin B12, folate and iron levels. Currently, the patient is symptomatic with fatigue and shortness of breath, more profound anemia significantly elevated MCVand borderline platelets. Absolute reticulocyte counts low, LDH is within normal range, total protein and calcium levels are within normal range, which indicates a low probability of developing advanced plasma cell dyscrasia, or hemolytic anemia.most likely Mrs. Springs has myelodysplastic syndrome, which fits well into her disease course and age group. We discussed 2 potential approaches to her situation, one being purely supportive with red blood cell transfusions, and I will one being somewhat more aggressive, with bone marrow biopsy to establish the diagnosis, and if high risk mild dysplastic syndrome is discovered, treatment with hypermetabolic and agents, which, specifically, azacitidine, can lead to improvement in hemoglobin concentration, and given prolongation of survival.Mrs. Sage stated that she would not want to pursue any invasive workup, and that she would not want to accept any chemotherapeutic agents. We agreed to proceed with red blood cell transfusions tomorrow. We, however, we will check serum protein electrophoresis and serum free light chains to confirm that monoclonal gammopathy of undetermined significance has not progressed. She will return to our clinic in 1 week, and, if needed, will receive additional red blood cell transfusions. We will try to target hemoglobin of 9 g/dL, taking into account history of lung resection, and significant symptoms of dyspnea with hemoglobin of less than 7 g/dL   Patient expressed  understanding and was in agreement with this plan. She also understands that She can call clinic at any time with any questions, concerns, or complaints.    No matching staging information was found for the patient.  Roxana Hires, MD   01/27/2016 2:52 PM  Consult for that she need any blood transfusion over his myeloma she is microcytic so she's had his anemia for years and years warm

## 2016-01-28 ENCOUNTER — Inpatient Hospital Stay: Payer: PPO

## 2016-01-28 ENCOUNTER — Other Ambulatory Visit: Payer: Self-pay | Admitting: *Deleted

## 2016-01-28 VITALS — BP 115/65 | HR 71 | Temp 97.0°F | Resp 20

## 2016-01-28 DIAGNOSIS — D539 Nutritional anemia, unspecified: Secondary | ICD-10-CM

## 2016-01-28 DIAGNOSIS — D472 Monoclonal gammopathy: Secondary | ICD-10-CM

## 2016-01-28 MED ORDER — SODIUM CHLORIDE 0.9 % IV SOLN
250.0000 mL | Freq: Once | INTRAVENOUS | Status: AC
  Administered 2016-01-28: 250 mL via INTRAVENOUS
  Filled 2016-01-28: qty 250

## 2016-01-28 MED ORDER — ACETAMINOPHEN 325 MG PO TABS
650.0000 mg | ORAL_TABLET | Freq: Once | ORAL | Status: AC
Start: 2016-01-28 — End: 2016-01-28
  Administered 2016-01-28: 650 mg via ORAL
  Filled 2016-01-28: qty 2

## 2016-01-28 NOTE — Addendum Note (Signed)
Addended by: Roxana Hires on: 01/28/2016 10:13 AM   Modules accepted: Orders

## 2016-01-29 LAB — TYPE AND SCREEN
ABO/RH(D): O POS
Antibody Screen: NEGATIVE
Unit division: 0
Unit division: 0

## 2016-02-04 ENCOUNTER — Inpatient Hospital Stay: Payer: PPO

## 2016-02-18 ENCOUNTER — Other Ambulatory Visit: Payer: Self-pay

## 2016-02-18 ENCOUNTER — Ambulatory Visit: Payer: Self-pay

## 2016-03-10 ENCOUNTER — Other Ambulatory Visit: Payer: Self-pay

## 2016-03-10 ENCOUNTER — Ambulatory Visit: Payer: Self-pay | Admitting: Hematology and Oncology

## 2016-03-10 ENCOUNTER — Ambulatory Visit: Payer: Self-pay

## 2016-04-25 ENCOUNTER — Encounter
Admission: EM | Disposition: A | Discharge status: Home / Self Care | Payer: Self-pay | Source: Home / Self Care | Attending: Internal Medicine

## 2016-04-25 ENCOUNTER — Encounter: Payer: Self-pay | Admitting: Anesthesiology

## 2016-04-25 ENCOUNTER — Inpatient Hospital Stay: Payer: PPO

## 2016-04-25 ENCOUNTER — Inpatient Hospital Stay
Admission: EM | Admit: 2016-04-25 | Discharge: 2016-04-26 | DRG: 379 | Disposition: A | Discharge status: Home / Self Care | Payer: PPO | Attending: Internal Medicine | Admitting: Internal Medicine

## 2016-04-25 ENCOUNTER — Emergency Department: Payer: PPO

## 2016-04-25 DIAGNOSIS — Z85118 Personal history of other malignant neoplasm of bronchus and lung: Secondary | ICD-10-CM

## 2016-04-25 DIAGNOSIS — J449 Chronic obstructive pulmonary disease, unspecified: Secondary | ICD-10-CM | POA: Diagnosis present

## 2016-04-25 DIAGNOSIS — Z833 Family history of diabetes mellitus: Secondary | ICD-10-CM | POA: Diagnosis not present

## 2016-04-25 DIAGNOSIS — Z8249 Family history of ischemic heart disease and other diseases of the circulatory system: Secondary | ICD-10-CM | POA: Diagnosis not present

## 2016-04-25 DIAGNOSIS — Z66 Do not resuscitate: Secondary | ICD-10-CM | POA: Diagnosis present

## 2016-04-25 DIAGNOSIS — Z902 Acquired absence of lung [part of]: Secondary | ICD-10-CM

## 2016-04-25 DIAGNOSIS — F329 Major depressive disorder, single episode, unspecified: Secondary | ICD-10-CM | POA: Diagnosis present

## 2016-04-25 DIAGNOSIS — K623 Rectal prolapse: Secondary | ICD-10-CM | POA: Diagnosis present

## 2016-04-25 DIAGNOSIS — I959 Hypotension, unspecified: Secondary | ICD-10-CM | POA: Diagnosis present

## 2016-04-25 DIAGNOSIS — D531 Other megaloblastic anemias, not elsewhere classified: Secondary | ICD-10-CM | POA: Diagnosis present

## 2016-04-25 DIAGNOSIS — K641 Second degree hemorrhoids: Secondary | ICD-10-CM | POA: Diagnosis present

## 2016-04-25 DIAGNOSIS — Z7951 Long term (current) use of inhaled steroids: Secondary | ICD-10-CM

## 2016-04-25 DIAGNOSIS — I7300 Raynaud's syndrome without gangrene: Secondary | ICD-10-CM | POA: Diagnosis present

## 2016-04-25 DIAGNOSIS — R197 Diarrhea, unspecified: Secondary | ICD-10-CM

## 2016-04-25 DIAGNOSIS — R1900 Intra-abdominal and pelvic swelling, mass and lump, unspecified site: Secondary | ICD-10-CM | POA: Diagnosis not present

## 2016-04-25 DIAGNOSIS — K921 Melena: Principal | ICD-10-CM | POA: Diagnosis present

## 2016-04-25 DIAGNOSIS — D649 Anemia, unspecified: Secondary | ICD-10-CM | POA: Diagnosis present

## 2016-04-25 DIAGNOSIS — Z79899 Other long term (current) drug therapy: Secondary | ICD-10-CM

## 2016-04-25 DIAGNOSIS — K922 Gastrointestinal hemorrhage, unspecified: Secondary | ICD-10-CM | POA: Diagnosis present

## 2016-04-25 DIAGNOSIS — Z90710 Acquired absence of both cervix and uterus: Secondary | ICD-10-CM | POA: Diagnosis not present

## 2016-04-25 DIAGNOSIS — Z9981 Dependence on supplemental oxygen: Secondary | ICD-10-CM | POA: Diagnosis not present

## 2016-04-25 DIAGNOSIS — Z87891 Personal history of nicotine dependence: Secondary | ICD-10-CM

## 2016-04-25 DIAGNOSIS — K5730 Diverticulosis of large intestine without perforation or abscess without bleeding: Secondary | ICD-10-CM | POA: Diagnosis present

## 2016-04-25 HISTORY — PX: FLEXIBLE SIGMOIDOSCOPY: SHX5431

## 2016-04-25 LAB — BASIC METABOLIC PANEL
Anion gap: 8 (ref 5–15)
BUN: 42 mg/dL — AB (ref 6–20)
CO2: 31 mmol/L (ref 22–32)
CREATININE: 1.16 mg/dL — AB (ref 0.44–1.00)
Calcium: 8.9 mg/dL (ref 8.9–10.3)
Chloride: 99 mmol/L — ABNORMAL LOW (ref 101–111)
GFR calc Af Amer: 47 mL/min — ABNORMAL LOW (ref >60)
GFR, EST NON AFRICAN AMERICAN: 41 mL/min — AB (ref >60)
GLUCOSE: 137 mg/dL — AB (ref 65–99)
POTASSIUM: 4.4 mmol/L (ref 3.5–5.1)
Sodium: 138 mmol/L (ref 135–145)

## 2016-04-25 LAB — CBC WITH DIFFERENTIAL/PLATELET
Basophils Absolute: 0.1 10*3/uL (ref 0–0.1)
Basophils Relative: 1 %
Eosinophils Absolute: 0.1 10*3/uL (ref 0–0.7)
HCT: 21.7 % — ABNORMAL LOW (ref 35.0–47.0)
Hemoglobin: 7.1 g/dL — ABNORMAL LOW (ref 12.0–16.0)
LYMPHS ABS: 1.5 10*3/uL (ref 1.0–3.6)
MCH: 37 pg — AB (ref 26.0–34.0)
MCHC: 32.7 g/dL (ref 32.0–36.0)
MCV: 112.9 fL — AB (ref 80.0–100.0)
MONO ABS: 0.7 10*3/uL (ref 0.2–0.9)
Neutro Abs: 7.8 10*3/uL — ABNORMAL HIGH (ref 1.4–6.5)
Neutrophils Relative %: 75 %
PLATELETS: 130 10*3/uL — AB (ref 150–440)
RBC: 1.92 MIL/uL — AB (ref 3.80–5.20)
RDW: 19.6 % — AB (ref 11.5–14.5)
WBC: 10.2 10*3/uL (ref 3.6–11.0)

## 2016-04-25 LAB — CBC
HCT: 25 % — ABNORMAL LOW (ref 35.0–47.0)
Hemoglobin: 8.3 g/dL — ABNORMAL LOW (ref 12.0–16.0)
MCH: 34.7 pg — ABNORMAL HIGH (ref 26.0–34.0)
MCHC: 33.4 g/dL (ref 32.0–36.0)
MCV: 103.9 fL — ABNORMAL HIGH (ref 80.0–100.0)
Platelets: 117 K/uL — ABNORMAL LOW (ref 150–440)
RBC: 2.41 MIL/uL — ABNORMAL LOW (ref 3.80–5.20)
RDW: 24.4 % — ABNORMAL HIGH (ref 11.5–14.5)
WBC: 5.2 K/uL (ref 3.6–11.0)

## 2016-04-25 LAB — PROTIME-INR
INR: 1.14
PROTHROMBIN TIME: 14.8 s (ref 11.4–15.0)

## 2016-04-25 LAB — GASTROINTESTINAL PANEL BY PCR, STOOL (REPLACES STOOL CULTURE)

## 2016-04-25 LAB — BRAIN NATRIURETIC PEPTIDE: B Natriuretic Peptide: 318 pg/mL — ABNORMAL HIGH (ref 0.0–100.0)

## 2016-04-25 LAB — TSH: TSH: 3.896 u[IU]/mL (ref 0.350–4.500)

## 2016-04-25 LAB — PREPARE RBC (CROSSMATCH)

## 2016-04-25 LAB — LACTIC ACID, PLASMA
Lactic Acid, Venous: 1.1 mmol/L (ref 0.5–2.0)
Lactic Acid, Venous: 2 mmol/L (ref 0.5–2.0)

## 2016-04-25 LAB — HEMOGLOBIN A1C: Hgb A1c MFr Bld: 5.1 % (ref 4.0–6.0)

## 2016-04-25 LAB — TROPONIN I: Troponin I: 0.04 ng/mL — ABNORMAL HIGH (ref <0.031)

## 2016-04-25 SURGERY — SIGMOIDOSCOPY, FLEXIBLE

## 2016-04-25 MED ORDER — IOPAMIDOL (ISOVUE-300) INJECTION 61%
80.0000 mL | Freq: Once | INTRAVENOUS | Status: AC | PRN
  Administered 2016-04-25: 80 mL via INTRAVENOUS

## 2016-04-25 MED ORDER — ADULT MULTIVITAMIN W/MINERALS CH
1.0000 | ORAL_TABLET | Freq: Every day | ORAL | Status: DC
  Administered 2016-04-26: 1 via ORAL
  Filled 2016-04-25: qty 1

## 2016-04-25 MED ORDER — B COMPLEX VITAMINS PO CAPS: 1.0000 | ORAL_CAPSULE | Freq: Every day | ORAL | Status: DC

## 2016-04-25 MED ORDER — ONDANSETRON HCL 4 MG PO TABS: 4.0000 mg | ORAL_TABLET | Freq: Four times a day (QID) | ORAL | Status: DC | PRN

## 2016-04-25 MED ORDER — SODIUM CHLORIDE 0.9 % IV SOLN
10.0000 mL/h | Freq: Once | INTRAVENOUS | Status: AC
  Administered 2016-04-25: 10 mL/h via INTRAVENOUS

## 2016-04-25 MED ORDER — SODIUM CHLORIDE 0.9% FLUSH
3.0000 mL | Freq: Two times a day (BID) | INTRAVENOUS | Status: DC
  Administered 2016-04-25 – 2016-04-26 (×2): 3 mL via INTRAVENOUS

## 2016-04-25 MED ORDER — ACETAMINOPHEN 325 MG PO TABS: 650.0000 mg | ORAL_TABLET | Freq: Four times a day (QID) | ORAL | Status: DC | PRN

## 2016-04-25 MED ORDER — MORPHINE SULFATE (PF) 2 MG/ML IV SOLN: 1.0000 mg | INTRAVENOUS | Status: DC | PRN

## 2016-04-25 MED ORDER — VENLAFAXINE HCL 37.5 MG PO TABS
37.5000 mg | ORAL_TABLET | Freq: Two times a day (BID) | ORAL | Status: DC
  Administered 2016-04-25 – 2016-04-26 (×3): 37.5 mg via ORAL
  Filled 2016-04-25 (×3): qty 1

## 2016-04-25 MED ORDER — AMLODIPINE BESYLATE 5 MG PO TABS
5.0000 mg | ORAL_TABLET | Freq: Every day | ORAL | Status: DC
  Administered 2016-04-26: 5 mg via ORAL
  Filled 2016-04-25 (×2): qty 1

## 2016-04-25 MED ORDER — ALPRAZOLAM 0.5 MG PO TABS
0.5000 mg | ORAL_TABLET | Freq: Every day | ORAL | Status: DC | PRN
  Administered 2016-04-25: 0.5 mg via ORAL
  Filled 2016-04-25: qty 1

## 2016-04-25 MED ORDER — METHYLPREDNISOLONE SODIUM SUCC 125 MG IJ SOLR
125.0000 mg | Freq: Once | INTRAMUSCULAR | Status: AC
  Administered 2016-04-25: 125 mg via INTRAVENOUS
  Filled 2016-04-25: qty 2

## 2016-04-25 MED ORDER — VITAMIN C 500 MG PO TABS
1000.0000 mg | ORAL_TABLET | Freq: Every day | ORAL | Status: DC
  Administered 2016-04-26: 1000 mg via ORAL
  Filled 2016-04-25: qty 2

## 2016-04-25 MED ORDER — ONDANSETRON HCL 4 MG/2ML IJ SOLN: 4.0000 mg | Freq: Four times a day (QID) | INTRAMUSCULAR | Status: DC | PRN

## 2016-04-25 MED ORDER — ACETAMINOPHEN 650 MG RE SUPP
650.0000 mg | Freq: Four times a day (QID) | RECTAL | Status: DC | PRN
Start: 2016-04-25 — End: 2016-04-26

## 2016-04-25 MED ORDER — IPRATROPIUM-ALBUTEROL 0.5-2.5 (3) MG/3ML IN SOLN
3.0000 mL | Freq: Four times a day (QID) | RESPIRATORY_TRACT | Status: DC | PRN
  Administered 2016-04-25: 3 mL via RESPIRATORY_TRACT
  Filled 2016-04-25: qty 3

## 2016-04-25 MED ORDER — VITAMIN D 1000 UNITS PO TABS
1000.0000 [IU] | ORAL_TABLET | Freq: Every day | ORAL | Status: DC
  Administered 2016-04-26: 1000 [IU] via ORAL
  Filled 2016-04-25: qty 1

## 2016-04-25 MED ORDER — DOCUSATE SODIUM 100 MG PO CAPS
100.0000 mg | ORAL_CAPSULE | Freq: Two times a day (BID) | ORAL | Status: DC
  Administered 2016-04-25: 100 mg via ORAL
  Filled 2016-04-25 (×2): qty 1

## 2016-04-25 MED ORDER — DIATRIZOATE MEGLUMINE & SODIUM 66-10 % PO SOLN
15.0000 mL | ORAL | Status: AC
  Administered 2016-04-25: 15 mL via ORAL

## 2016-04-25 MED ORDER — B COMPLEX-C PO TABS
1.0000 | ORAL_TABLET | Freq: Every day | ORAL | Status: DC
  Administered 2016-04-26: 1 via ORAL
  Filled 2016-04-25 (×2): qty 1

## 2016-04-25 MED ORDER — CALCIUM CARBONATE-VITAMIN D 500-200 MG-UNIT PO TABS
1.0000 | ORAL_TABLET | Freq: Two times a day (BID) | ORAL | Status: DC
  Administered 2016-04-25 – 2016-04-26 (×2): 1 via ORAL
  Filled 2016-04-25 (×2): qty 1

## 2016-04-25 MED ORDER — FUROSEMIDE 10 MG/ML IJ SOLN
20.0000 mg | Freq: Once | INTRAMUSCULAR | Status: AC
  Administered 2016-04-25: 20 mg via INTRAVENOUS
  Filled 2016-04-25: qty 2

## 2016-04-25 MED ORDER — PANTOPRAZOLE SODIUM 40 MG IV SOLR
40.0000 mg | Freq: Two times a day (BID) | INTRAVENOUS | Status: DC
  Administered 2016-04-25 – 2016-04-26 (×2): 40 mg via INTRAVENOUS
  Filled 2016-04-25 (×2): qty 40

## 2016-04-25 MED ORDER — CALCIUM + D3 600-200 MG-UNIT PO TABS: 1.0000 | ORAL_TABLET | Freq: Two times a day (BID) | ORAL | Status: DC

## 2016-04-25 MED ORDER — VITAMIN E 180 MG (400 UNIT) PO CAPS
400.0000 [IU] | ORAL_CAPSULE | Freq: Every day | ORAL | Status: DC
  Administered 2016-04-26: 400 [IU] via ORAL
  Filled 2016-04-25: qty 1

## 2016-04-25 MED ORDER — CETYLPYRIDINIUM CHLORIDE 0.05 % MT LIQD
7.0000 mL | Freq: Two times a day (BID) | OROMUCOSAL | Status: DC
  Administered 2016-04-25 – 2016-04-26 (×2): 7 mL via OROMUCOSAL

## 2016-04-25 MED ORDER — MOMETASONE FURO-FORMOTEROL FUM 200-5 MCG/ACT IN AERO
2.0000 | INHALATION_SPRAY | Freq: Two times a day (BID) | RESPIRATORY_TRACT | Status: DC
  Administered 2016-04-25 – 2016-04-26 (×3): 2 via RESPIRATORY_TRACT
  Filled 2016-04-25: qty 8.8

## 2016-04-25 NOTE — ED Notes (Signed)
Pt having shortness of breath all day that has gotten progressively worse.  Pt had 2 duonebs and 1 albuterol en route.  No initial O2 sat gotten by EMS.  Pt alert and oriented upon arrival.  Pt states she's having diarrhea and is on her "3rd bottle of immodium".  Pt states diarrhea has been going on for 4 weeks.

## 2016-04-25 NOTE — Op Note (Signed)
[facility] Gastroenterology Patient Name: Sara Mccoy Procedure Date: 04/25/2016 1:11 PM MRN: [id number] Account #: [id number] Date of Birth: [birth year] Admit Type: Inpatient Age: 82 Room: [facility] ENDO ROOM 4 Gender: Female Note Status: Finalized Procedure:            Flexible Sigmoidoscopy Indications:          Hematochezia Providers:            Lucilla Lame, MD Referring MD:         Tracie Harrier, MD (Referring MD) Medicines:            None Complications:        No immediate complications. Procedure:            Pre-Anesthesia Assessment:                       - Prior to the procedure, a History and Physical was                        performed, and patient medications and allergies were                        reviewed. The patient's tolerance of previous                        anesthesia was also reviewed. The risks and benefits of                        the procedure and the sedation options and risks were                        discussed with the patient. All questions were                        answered, and informed consent was obtained. Prior                        Anticoagulants: The patient has taken no previous                        anticoagulant or antiplatelet agents. ASA Grade                        Assessment: II - A patient with mild systemic disease.                        After reviewing the risks and benefits, the patient was                        deemed in satisfactory condition to undergo the                        procedure.                       After obtaining informed consent, the scope was passed                        under direct vision. The Endoscope was introduced  through the anus and advanced to the the sigmoid colon.                        The flexible sigmoidoscopy was accomplished without                        difficulty. The patient tolerated the procedure well.                        The quality  of the bowel preparation was poor. Findings:      The perianal and digital rectal examinations were normal.      Multiple small-mouthed diverticula were found in the sigmoid colon.      Non-bleeding internal hemorrhoids were found during retroflexion. The       hemorrhoids were Grade II (internal hemorrhoids that prolapse but reduce       spontaneously). Impression:           - Preparation of the colon was poor.                       - Diverticulosis in the sigmoid colon.                       - No specimens collected.                       - Prolapse is likely the cause of the patients rectal                        protrussion.                       - Internal hemorrhoids. Recommendation:       - Return patient to hospital ward. Procedure Code(s):    --- Professional ---                       [phone], Sigmoidoscopy, flexible; diagnostic, including                        collection of specimen(s) by brushing or washing, when                        performed (separate procedure) Diagnosis Code(s):    --- Professional ---                       K92.1, Melena (includes Hematochezia)                       K57.30, Diverticulosis of large intestine without                        perforation or abscess without bleeding CPT copyright 2016 [employer]. All rights reserved. The codes documented in this report are preliminary and upon coder review may  be revised to meet current compliance requirements. Lucilla Lame, MD 04/25/2016 1:21:34 PM This report has been signed electronically. Number of Addenda: 0 Note Initiated On: 04/25/2016 1:11 PM      [facility]

## 2016-04-25 NOTE — Progress Notes (Signed)
PHARMACY - PHYSICIAN COMMUNICATION CRITICAL VALUE ALERT - BLOOD CULTURE IDENTIFICATION (BCID)  [employer] returned Staph spp. mecA(-), GPC x1. Patient is not currently on antibiotic, afebrile, WBC WNL.  Name of physician (or Provider) Contacted: Willis  Changes to prescribed antibiotics required: no  Cejay Cambre S 04/25/2016  10:54 PM

## 2016-04-25 NOTE — ED Provider Notes (Signed)
[facility] Emergency Department Provider Note   ____________________________________________  Time seen: Approximately 12:58 AM  I have reviewed the triage vital signs and the nursing notes.   HISTORY  Chief Complaint Shortness of Breath    HPI Sara Mccoy is a 82 y.o. female who presents to the ED from home via EMS with a chief complaint of shortness of breath. Patient has a history of COPD who wears 2 L of oxygen continuously. States she has been having progressive shortness of breath throughout the day. Complains of subjective fever, nonproductive cough. Denies chest pain, abdominal pain, nausea, vomiting. Notes she has been having diarrhea for the past 4 weeks and is using up to 6 Imodium per day. Denies recent travel or trauma. Nothing makes her symptoms worse. 2 DuoNebs and an albuterol nebulizer administered per EMS makes patient's symptoms better.   Past Medical History  Diagnosis Date  . COPD (chronic obstructive pulmonary disease) ([place])   . Anemia   . Raynaud's disease   . Lung cancer, upper lobe ([place]) 1997    right upper lobe    There are no active problems to display for this patient.   Past Surgical History  Procedure Laterality Date  . Abdominal hysterectomy    . Lung surgery Right 1997    RUL removed    Current Outpatient Rx  Name  Route  Sig  Dispense  Refill  . ALPRAZolam (XANAX) 0.5 MG tablet   Oral   Take 0.5 mg by mouth daily as needed for anxiety.         Marland Kitchen amLODipine (NORVASC) 5 MG tablet   Oral   Take 5 mg by mouth daily.         . Ascorbic Acid (VITAMIN C) 1000 MG tablet   Oral   Take 1,000 mg by mouth daily.         Marland Kitchen b complex vitamins capsule   Oral   Take 1 capsule by mouth daily.         . Calcium Carb-Cholecalciferol (CALCIUM + D3) 600-200 MG-UNIT TABS   Oral   Take 1 tablet by mouth 2 (two) times daily.         Marland Kitchen etodolac (LODINE) 400 MG tablet   Oral   Take 400 mg by  mouth 2 (two) times daily.         . Fluticasone-Salmeterol (ADVAIR) 500-50 MCG/DOSE AEPB   Inhalation   Inhale 1 puff into the lungs 2 (two) times daily.         Marland Kitchen ipratropium-albuterol (DUONEB) 0.5-2.5 (3) MG/3ML SOLN   Nebulization   Take 3 mLs by nebulization every 6 (six) hours as needed.         . Multiple Vitamin (MULTIVITAMIN WITH MINERALS) TABS tablet   Oral   Take 1 tablet by mouth daily.         Marland Kitchen omeprazole (PRILOSEC) 40 MG capsule   Oral   Take 40 mg by mouth daily.         Marland Kitchen oxyCODONE-acetaminophen (PERCOCET/ROXICET) 5-325 MG tablet   Oral   Take by mouth 2 (two) times daily as needed for severe pain.         Marland Kitchen venlafaxine (EFFEXOR) 37.5 MG tablet   Oral   Take 37.5 mg by mouth 2 (two) times daily.         . vitamin E 400 UNIT capsule   Oral   Take 400 Units by mouth daily.  Allergies Review of patient's allergies indicates no known allergies.  Family History  Problem Relation Age of Onset  . Diabetes Mother   . Heart attack Son   . Hypertension Daughter     Social History Social History  Substance Use Topics  . Smoking status: Former Smoker -- 2.00 packs/day for 46 years  . Smokeless tobacco: Never Used  . Alcohol Use: No    Review of Systems  Constitutional: Positive for subjective fever. Negative for chills. Eyes: No visual changes. ENT: No sore throat. Cardiovascular: Denies chest pain. Respiratory: Positive for cough and shortness of breath. Gastrointestinal: No abdominal pain.  No nausea, no vomiting.  No diarrhea.  No constipation. Genitourinary: Negative for dysuria. Musculoskeletal: Positive for pedal edema. Negative for back pain. Skin: Negative for rash. Neurological: Negative for headaches, focal weakness or numbness.  10-point ROS otherwise negative.  ____________________________________________   PHYSICAL EXAM:  VITAL SIGNS: ED Triage Vitals  Enc Vitals Group     BP --      Pulse --       Resp --      Temp --      Temp src --      SpO2 --      Weight --      Height --      Head Cir --      Peak Flow --      Pain Score --      Pain Loc --      Pain Edu? --      Excl. in [place]? --     Constitutional: Alert and oriented. Well appearing and in mild acute distress. Eyes: Conjunctivae are normal. PERRL. EOMI. Head: Atraumatic. Nose: No congestion/rhinnorhea. Mouth/Throat: Mucous membranes are moist.  Oropharynx non-erythematous. Neck: No stridor.   Cardiovascular: Normal rate, regular rhythm. Grossly normal heart sounds.  Good peripheral circulation. Respiratory: Increased respiratory effort.  No retractions. Lungs slightly diminished bibasilarly but otherwise clear to auscultation.. Gastrointestinal: Soft and nontender. No distention. No abdominal bruits. No CVA tenderness. Musculoskeletal: No lower extremity tenderness. 1+ BLE pitting edema.  No joint effusions. Neurologic:  Normal speech and language. No gross focal neurologic deficits are appreciated.  Skin:  Skin is warm, dry and intact. No rash noted. Psychiatric: Mood and affect are normal. Speech and behavior are normal.  ____________________________________________   LABS (all labs ordered are listed, but only abnormal results are displayed)  Labs Reviewed  CBC WITH DIFFERENTIAL/PLATELET - Abnormal; Notable for the following:    RBC 1.92 (*)    Hemoglobin 7.1 (*)    HCT 21.7 (*)    MCV 112.9 (*)    MCH 37.0 (*)    RDW 19.6 (*)    Platelets 130 (*)    Neutro Abs 7.8 (*)    All other components within normal limits  BASIC METABOLIC PANEL - Abnormal; Notable for the following:    Chloride 99 (*)    Glucose, Bld 137 (*)    BUN 42 (*)    Creatinine, Ser 1.16 (*)    GFR calc non Af Amer 41 (*)    GFR calc Af Amer 47 (*)    All other components within normal limits  TROPONIN I - Abnormal; Notable for the following:    Troponin I 0.04 (*)    All other components within normal limits  BRAIN NATRIURETIC  PEPTIDE - Abnormal; Notable for the following:    B Natriuretic Peptide 318.0 (*)    All other components  within normal limits  CULTURE, BLOOD (ROUTINE X 2)  CULTURE, BLOOD (ROUTINE X 2)  GASTROINTESTINAL PANEL BY PCR, STOOL (REPLACES STOOL CULTURE)  C DIFFICILE QUICK SCREEN W PCR REFLEX  LACTIC ACID, PLASMA  PROTIME-INR  LACTIC ACID, PLASMA  TYPE AND SCREEN  PREPARE RBC (CROSSMATCH)   ____________________________________________  EKG  ED ECG REPORT I, Izabell Schalk J, the attending physician, personally viewed and interpreted this ECG.   Date: 04/25/2016  EKG Time: 0104  Rate: 83  Rhythm: normal EKG, normal sinus rhythm  Axis: Normal  Intervals:right bundle branch block  ST&T Change: Nonspecific  ____________________________________________  RADIOLOGY  Portable chest x-ray (viewed by me, interpreted per Dr. Quintella Reichert): No acute cardiopulmonary process. ____________________________________________   PROCEDURES  Procedure(s) performed: None  Critical Care performed: Yes, see critical care note(s)   CRITICAL CARE Performed by: Paulette Blanch   Total critical care time: 30 minutes  Critical care time was exclusive of separately billable procedures and treating other patients.  Critical care was necessary to treat or prevent imminent or life-threatening deterioration.  Critical care was time spent personally by me on the following activities: development of treatment plan with patient and/or surrogate as well as nursing, discussions with consultants, evaluation of patient's response to treatment, examination of patient, obtaining history from patient or surrogate, ordering and performing treatments and interventions, ordering and review of laboratory studies, ordering and review of radiographic studies, pulse oximetry and re-evaluation of patient's condition.  ____________________________________________   INITIAL IMPRESSION / ASSESSMENT AND PLAN / ED  COURSE  Pertinent labs & imaging results that were available during my care of the patient were reviewed by me and considered in my medical decision making (see chart for details).  82 year old female with a history of COPD on 2 L continuous oxygen who presents with cough, subjective fevers and increased shortness of breath. Also with diarrhea for the past month. Will check screening labs including electrolytes, stool cultures if patient is able to provide specimen. IV Solu-Medrol administered, chest x-ray pending. Will monitor and reassess.  ----------------------------------------- 2:20 AM on 04/25/2016 -----------------------------------------  Patient feels much better. Noted low hemoglobin and hematocrit. Patient began to receive blood transfusion in February for macrocytic anemia. That was her first and only transfusion thus far. She states she has been passing blood along with her stools and that last night she was passing bright red blood. Denies abdominal pain. Rectal exam notable for brown stool which was immediately heme positive. I have ordered type and screen along with a transfusion for 2 units of PRBCs. Given degree of pedal edema and elevated BNP, I will order a dose of Lasix to be administered between units. We'll discuss with hospitalist for evaluation in the emergency department for admission. ____________________________________________   FINAL CLINICAL IMPRESSION(S) / ED DIAGNOSES  Final diagnoses:  Anemia, unspecified anemia type  Gastrointestinal hemorrhage, unspecified gastritis, unspecified gastrointestinal hemorrhage type  Diarrhea, unspecified type  Chronic obstructive pulmonary disease, unspecified COPD type ([place])      NEW MEDICATIONS STARTED DURING THIS VISIT:  New Prescriptions   No medications on file     Note:  This document was prepared using Dragon voice recognition software and may include unintentional dictation errors.    Paulette Blanch,  MD 04/25/16 [phone]

## 2016-04-25 NOTE — Progress Notes (Signed)
[facility] [place] NAME: Sara Mccoy    MR#:  [id number]  DATE OF BIRTH:  12-Dec-1925  SUBJECTIVE:  CHIEF COMPLAINT:   Chief Complaint  Patient presents with  . Shortness of Breath   - Mitigated with rectal bleed and noted to have rectal mass. -For flex sigmoidoscopy today. Continues to feel weak  REVIEW OF SYSTEMS:  Review of Systems  Constitutional: Positive for weight loss and malaise/fatigue. Negative for fever and chills.  HENT: Negative for ear discharge, ear pain and nosebleeds.   Eyes: Negative for blurred vision and double vision.  Respiratory: Negative for cough, shortness of breath and wheezing.   Cardiovascular: Negative for chest pain, palpitations and leg swelling.  Gastrointestinal: Negative for nausea, vomiting, abdominal pain, diarrhea and constipation.  Genitourinary: Negative for dysuria.  Musculoskeletal: Negative for myalgias.  Neurological: Negative for dizziness, speech change, focal weakness, seizures and headaches.  Psychiatric/Behavioral: Negative for depression.    DRUG ALLERGIES:  No Known Allergies  VITALS:  Blood pressure 136/56, pulse 70, temperature 97.8 F (36.6 C), temperature source Oral, resp. rate 16, height '5\' 1"'$  (1.549 m), weight 68.13 kg (150 lb 3.2 oz), SpO2 100 %.  PHYSICAL EXAMINATION:  Physical Exam  GENERAL:  82 y.o.-year-old patient lying in the bed with no acute distress.  EYES: Pupils equal, round, reactive to light and accommodation. No scleral icterus. Extraocular muscles intact.  HEENT: Head atraumatic, normocephalic. Oropharynx and nasopharynx clear.  NECK:  Supple, no jugular venous distention. No thyroid enlargement, no tenderness.  LUNGS: Normal breath sounds bilaterally, no wheezing, rales,rhonchi or crepitation. No use of accessory muscles of respiration.  CARDIOVASCULAR: S1, S2 normal. No murmurs, rubs, or gallops.  ABDOMEN: Soft, nontender, nondistended.  Bowel sounds present. No organomegaly or mass.  EXTREMITIES: No pedal edema, cyanosis, or clubbing.  NEUROLOGIC: Cranial nerves II through XII are intact. Muscle strength 5/5 in all extremities. Sensation intact. Gait not checked.  PSYCHIATRIC: The patient is alert and oriented x 3.  SKIN: No obvious rash, lesion, or ulcer.    LABORATORY PANEL:   CBC  Recent Labs Lab 04/25/16 0106  WBC 10.2  HGB 7.1*  HCT 21.7*  PLT 130*   ------------------------------------------------------------------------------------------------------------------  Chemistries   Recent Labs Lab 04/25/16 0106  NA 138  K 4.4  CL 99*  CO2 31  GLUCOSE 137*  BUN 42*  CREATININE 1.16*  CALCIUM 8.9   ------------------------------------------------------------------------------------------------------------------  Cardiac Enzymes  Recent Labs Lab 04/25/16 0106  TROPONINI 0.04*   ------------------------------------------------------------------------------------------------------------------  RADIOLOGY:  Dg Chest Port 1 View  04/25/2016  CLINICAL DATA:  82 year old female with shortness of breath EXAM: PORTABLE CHEST 1 VIEW COMPARISON:  Radiograph dated 08/29/2014 and chest CT dated 12/01/2008 FINDINGS: Single portable view of the chest demonstrates emphysematous changes of the lungs with areas of scarring at the right lung base. There is no focal consolidation, pleural effusion, or pneumothorax. Right hilar surgical clips noted. Top-normal cardiac silhouette. There is osteopenia with degenerative changes of the spine. No acute fracture. IMPRESSION: No acute cardiopulmonary process. Electronically Signed   By: Anner Crete M.D.   On: 04/25/2016 02:18    EKG:   Orders placed or performed during the hospital encounter of 04/25/16  . ED EKG  . ED EKG  . EKG 12-Lead  . EKG 12-Lead    ASSESSMENT AND PLAN:   82 year old female with past medical history significant for COPD, Raynaud's disease,  chronic anemia presents to the hospital secondary to rectal  bleed.  #1 rectal bleed-possible rectal mass. Appreciate GI consult. -For flex sigmoidoscopy today  #2 acute on chronic anemia-secondary to GI bleed. Received 2 units transfusion. -Follow up repeat hemoglobin later today.  #3 COPD-continue inhalers. No wheezing on exam. Stable  #4 history of right upper lobe lung cancer-status post resection of right upper lobe several years ago. Stable  #5 depression-continue Effexor  #6 DVT prophylaxis-Ted's and SCDs for now   All the records are reviewed and case discussed with Care Management/Social Workerr. Management plans discussed with the patient, family and they are in agreement.  CODE STATUS: DNR   TOTAL TIME TAKING CARE OF THIS PATIENT: 33 minutes.   POSSIBLE D/C IN 1-2 DAYS, DEPENDING ON CLINICAL CONDITION.   Mohan Erven M.D on 04/25/2016 at 1:21 PM  Between 7am to 6pm - Pager - [phone]  After 6pm go to [email or web address] - password [email or web address] [facility]  Office  [phone]  CC: Primary care physician; Tracie Harrier, MD

## 2016-04-25 NOTE — Consult Note (Signed)
[facility]  [address] [place], [place] [address] Phone: [phone] Fax : [phone]  Consultation  Referring Provider:     No ref. provider found Primary Care Physician:  Tracie Harrier, MD Primary Gastroenterologist:          Reason for Consultation:     Rectal mass and anemia  Date of Admission:  04/25/2016 Date of Consultation:  04/25/2016         HPI:   Sara Mccoy is a 82 y.o. female who comes in reporting rectal bleeding just prior to coming to the hospital. The patient has also reported shortness of breath with a history of COPD. The patient has a finding of physical exam of the rectal mass and I am being consult for that. The patient denies having a colonoscopy recently and states she had one many years ago. The patient also reports that she's had bowel movements that have been runny and watery for about 4 months. The patient also reports that she is lost approximately 100 pounds over the last 5 years. The patient has been transfused with blood since she has been admitted.  Past Medical History  Diagnosis Date  . COPD (chronic obstructive pulmonary disease) ([place])   . Anemia   . Raynaud's disease   . Lung cancer, upper lobe ([place]) 1997    right upper lobe    Past Surgical History  Procedure Laterality Date  . Abdominal hysterectomy    . Lung surgery Right 1997    RUL removed    Prior to Admission medications   Medication Sig Start Date End Date Taking? Authorizing Provider  ALPRAZolam Duanne Moron) 0.5 MG tablet Take 0.5 mg by mouth daily as needed for anxiety.   Yes Historical Provider, MD  amLODipine (NORVASC) 5 MG tablet Take 5 mg by mouth daily.   Yes Historical Provider, MD  Ascorbic Acid (VITAMIN C) 1000 MG tablet Take 1,000 mg by mouth daily.   Yes Historical Provider, MD  b complex vitamins capsule Take 1 capsule by mouth daily.   Yes Historical Provider, MD  Calcium Carb-Cholecalciferol (CALCIUM + D3) 600-200 MG-UNIT TABS  Take 1 tablet by mouth 2 (two) times daily.   Yes Historical Provider, MD  cholecalciferol (VITAMIN D) 1000 units tablet Take 1,000 Units by mouth daily.   Yes Historical Provider, MD  etodolac (LODINE) 400 MG tablet Take 400 mg by mouth 2 (two) times daily.   Yes Historical Provider, MD  Fluticasone-Salmeterol (ADVAIR) 500-50 MCG/DOSE AEPB Inhale 1 puff into the lungs 2 (two) times daily.   Yes Historical Provider, MD  ipratropium-albuterol (DUONEB) 0.5-2.5 (3) MG/3ML SOLN Take 3 mLs by nebulization every 6 (six) hours as needed.   Yes Historical Provider, MD  Multiple Vitamin (MULTIVITAMIN WITH MINERALS) TABS tablet Take 1 tablet by mouth daily.   Yes Historical Provider, MD  omeprazole (PRILOSEC) 40 MG capsule Take 40 mg by mouth daily.   Yes Historical Provider, MD  oxyCODONE-acetaminophen (PERCOCET/ROXICET) 5-325 MG tablet Take by mouth 2 (two) times daily as needed for severe pain.   Yes Historical Provider, MD  venlafaxine (EFFEXOR) 37.5 MG tablet Take 37.5 mg by mouth 2 (two) times daily.   Yes Historical Provider, MD  vitamin E 400 UNIT capsule Take 400 Units by mouth daily.   Yes Historical Provider, MD    Family History  Problem Relation Age of Onset  . Diabetes Mother   . Heart attack Son   . Hypertension Daughter      Social  History  Substance Use Topics  . Smoking status: Former Smoker -- 2.00 packs/day for 46 years  . Smokeless tobacco: Never Used  . Alcohol Use: No    Allergies as of 04/25/2016  . (No Known Allergies)    Review of Systems:    All systems reviewed and negative except where noted in HPI.   Physical Exam:  Vital signs in last 24 hours: Temp:  [97.7 F (36.5 C)-98.1 F (36.7 C)] 98 F (36.7 C) (05/20 0620) Pulse Rate:  [66-82] 66 (05/20 0620) Resp:  [18-30] 20 (05/20 0620) BP: (98-124)/(43-54) 109/46 mmHg (05/20 0620) SpO2:  [86 %-100 %] 99 % (05/20 0620) Weight:  [150 lb 3.2 oz (68.13 kg)] 150 lb 3.2 oz (68.13 kg) (05/20 0402) Last BM Date:  04/25/16 General:   Pleasant, cooperative in NAD Head:  Normocephalic and atraumatic. Eyes:   No icterus.   Conjunctiva pink. PERRLA. Ears:  Normal auditory acuity. Neck:  Supple; no masses or thyroidomegaly Lungs: Respirations even and unlabored. Lungs clear to auscultation bilaterally.   No wheezes, crackles, or rhonchi.  Heart:  Regular rate and rhythm;  Without murmur, clicks, rubs or gallops Abdomen:  Soft, nondistended, nontender. Normal bowel sounds. No appreciable masses or hepatomegaly.  No rebound or guarding.  Rectal:  Not performed. Msk:  Symmetrical without gross deformities.    Extremities:  Without edema, cyanosis or clubbing. Neurologic:  Alert and oriented x3;  grossly normal neurologically. Skin:  Intact without significant lesions or rashes. Cervical Nodes:  No significant cervical adenopathy. Psych:  Alert and cooperative. Normal affect.  LAB RESULTS:  Recent Labs  04/25/16 0106  WBC 10.2  HGB 7.1*  HCT 21.7*  PLT 130*   BMET  Recent Labs  04/25/16 0106  NA 138  K 4.4  CL 99*  CO2 31  GLUCOSE 137*  BUN 42*  CREATININE 1.16*  CALCIUM 8.9   LFT No results for input(s): PROT, ALBUMIN, AST, ALT, ALKPHOS, BILITOT, BILIDIR, IBILI in the last 72 hours. PT/INR  Recent Labs  04/25/16 0107  LABPROT 14.8  INR 1.14    STUDIES: Dg Chest Port 1 View  04/25/2016  CLINICAL DATA:  82 year old female with shortness of breath EXAM: PORTABLE CHEST 1 VIEW COMPARISON:  Radiograph dated 08/29/2014 and chest CT dated 12/01/2008 FINDINGS: Single portable view of the chest demonstrates emphysematous changes of the lungs with areas of scarring at the right lung base. There is no focal consolidation, pleural effusion, or pneumothorax. Right hilar surgical clips noted. Top-normal cardiac silhouette. There is osteopenia with degenerative changes of the spine. No acute fracture. IMPRESSION: No acute cardiopulmonary process. Electronically Signed   By: Anner Crete M.D.    On: 04/25/2016 02:18      Impression / Plan:   Sara Mccoy is a 82 y.o. y/o female with With rectal bleeding and anemia who was found on admission to have a rectal mass. The patient will be taken down today for a unprepped flexible sigmoidoscopy to look at the rectal mass and biopsy any abnormalities for possible further treatment.I have discussed risks & benefits which include, but are not limited to, bleeding, infection, perforation & drug reaction.  The patient agrees with this plan & written consent will be obtained.      Thank you for involving me in the care of this patient.      LOS: 0 days   Lucilla Lame, MD  04/25/2016, 8:24 AM   Note: This dictation was prepared with Dragon dictation along  with smaller phrase technology. Any transcriptional errors that result from this process are unintentional.

## 2016-04-25 NOTE — H&P (Addendum)
Sara Mccoy is an 82 y.o. female.   Chief Complaint: Shortness of breath HPI: The patient with past medical history of lung cancer and COPD presents emergency department complaining of shortness of breath. She states that she awoke from sleep this evening unable to breathe. She was able to make it from her bedroom to the living room in order to use her supplemental oxygen. Once EMS arrived she received 3 breathing treatments which greatly improved her dyspnea. In the emergency department she was found to be hypotensive but stable. Laboratory evaluation revealed hemoglobin of 7.1 which prompted transfusion of 2 units packed red blood cells. The patient has been anemic for quite some time and received her first and last transfusion 3 months ago. Today she reveals that she had to grossly bloody bowel movements last night that were nonpainful. However she admits that she feels a mass in her rectum that sometimes protrudes following a bowel movement. Her bowel movements have been runny and watery 4 months. The patient states that she has been taking Imodium without significant relief. In the emergency department, stool was found to be immediately Hemoccult positive but formed and brown in color. The patient admits to abdominal pain but denies pain with defecation. Due to her respiratory distress as well as symptomatic anemia and likely colon mass emergency Walker staff called the hospitalist service for further management.  Past Medical History  Diagnosis Date  . COPD (chronic obstructive pulmonary disease) ([place])   . Anemia   . Raynaud's disease   . Lung cancer, upper lobe ([place]) 1997    right upper lobe    Past Surgical History  Procedure Laterality Date  . Abdominal hysterectomy    . Lung surgery Right 1997    RUL removed    Family History  Problem Relation Age of Onset  . Diabetes Mother   . Heart attack Son   . Hypertension Daughter    Social History:  reports that she has quit  smoking. She has never used smokeless tobacco. She reports that she does not drink alcohol or use illicit drugs.  Allergies: No Known Allergies  Prior to Admission medications   Medication Sig Start Date End Date Taking? Authorizing Provider  ALPRAZolam Duanne Moron) 0.5 MG tablet Take 0.5 mg by mouth daily as needed for anxiety.   Yes Historical Provider, MD  amLODipine (NORVASC) 5 MG tablet Take 5 mg by mouth daily.   Yes Historical Provider, MD  Ascorbic Acid (VITAMIN C) 1000 MG tablet Take 1,000 mg by mouth daily.   Yes Historical Provider, MD  b complex vitamins capsule Take 1 capsule by mouth daily.   Yes Historical Provider, MD  Calcium Carb-Cholecalciferol (CALCIUM + D3) 600-200 MG-UNIT TABS Take 1 tablet by mouth 2 (two) times daily.   Yes Historical Provider, MD  cholecalciferol (VITAMIN D) 1000 units tablet Take 1,000 Units by mouth daily.   Yes Historical Provider, MD  etodolac (LODINE) 400 MG tablet Take 400 mg by mouth 2 (two) times daily.   Yes Historical Provider, MD  Fluticasone-Salmeterol (ADVAIR) 500-50 MCG/DOSE AEPB Inhale 1 puff into the lungs 2 (two) times daily.   Yes Historical Provider, MD  ipratropium-albuterol (DUONEB) 0.5-2.5 (3) MG/3ML SOLN Take 3 mLs by nebulization every 6 (six) hours as needed.   Yes Historical Provider, MD  Multiple Vitamin (MULTIVITAMIN WITH MINERALS) TABS tablet Take 1 tablet by mouth daily.   Yes Historical Provider, MD  omeprazole (PRILOSEC) 40 MG capsule Take 40 mg by mouth daily.  Yes Historical Provider, MD  oxyCODONE-acetaminophen (PERCOCET/ROXICET) 5-325 MG tablet Take by mouth 2 (two) times daily as needed for severe pain.   Yes Historical Provider, MD  venlafaxine (EFFEXOR) 37.5 MG tablet Take 37.5 mg by mouth 2 (two) times daily.   Yes Historical Provider, MD  vitamin E 400 UNIT capsule Take 400 Units by mouth daily.   Yes Historical Provider, MD     Results for orders placed or performed during the hospital encounter of 04/25/16 (from  the past 48 hour(s))  Lactic acid, plasma     Status: None   Collection Time: 04/25/16  1:05 AM  Result Value Ref Range   Lactic Acid, Venous 1.1 0.5 - 2.0 mmol/L  CBC with Differential     Status: Abnormal   Collection Time: 04/25/16  1:06 AM  Result Value Ref Range   WBC 10.2 3.6 - 11.0 K/uL   RBC 1.92 (L) 3.80 - 5.20 MIL/uL   Hemoglobin 7.1 (L) 12.0 - 16.0 g/dL   HCT 21.7 (L) 35.0 - 47.0 %   MCV 112.9 (H) 80.0 - 100.0 fL   MCH 37.0 (H) 26.0 - 34.0 pg   MCHC 32.7 32.0 - 36.0 g/dL   RDW 19.6 (H) 11.5 - 14.5 %   Platelets 130 (L) 150 - 440 K/uL   Neutrophils Relative % 75% %   Neutro Abs 7.8 (H) 1.4 - 6.5 K/uL   Lymphocytes Relative 15% %   Lymphs Abs 1.5 1.0 - 3.6 K/uL   Monocytes Relative 7% %   Monocytes Absolute 0.7 0.2 - 0.9 K/uL   Eosinophils Relative 2% %   Eosinophils Absolute 0.1 0 - 0.7 K/uL   Basophils Relative 1% %   Basophils Absolute 0.1 0 - 0.1 K/uL  Basic metabolic panel     Status: Abnormal   Collection Time: 04/25/16  1:06 AM  Result Value Ref Range   Sodium 138 135 - 145 mmol/L   Potassium 4.4 3.5 - 5.1 mmol/L   Chloride 99 (L) 101 - 111 mmol/L   CO2 31 22 - 32 mmol/L   Glucose, Bld 137 (H) 65 - 99 mg/dL   BUN 42 (H) 6 - 20 mg/dL   Creatinine, Ser 1.16 (H) 0.44 - 1.00 mg/dL   Calcium 8.9 8.9 - 10.3 mg/dL   GFR calc non Af Amer 41 (L) >60 mL/min   GFR calc Af Amer 47 (L) >60 mL/min    Comment: (NOTE) The eGFR has been calculated using the CKD EPI equation. This calculation has not been validated in all clinical situations. eGFR's persistently <60 mL/min signify possible Chronic Kidney Disease.    Anion gap 8 5 - 15  Troponin I     Status: Abnormal   Collection Time: 04/25/16  1:06 AM  Result Value Ref Range   Troponin I 0.04 (H) <0.031 ng/mL    Comment: READ BACK AND VERIFIED WITH IRIS GUIDRY AT 0141 ON 04/25/16 RWW        PERSISTENTLY INCREASED TROPONIN VALUES IN THE RANGE OF 0.04-0.49 ng/mL CAN BE SEEN IN:       -UNSTABLE ANGINA        -CONGESTIVE HEART FAILURE       -MYOCARDITIS       -CHEST TRAUMA       -ARRYHTHMIAS       -LATE PRESENTING MYOCARDIAL INFARCTION       -COPD   CLINICAL FOLLOW-UP RECOMMENDED.   Brain natriuretic peptide     Status: Abnormal  Collection Time: 04/25/16  1:06 AM  Result Value Ref Range   B Natriuretic Peptide 318.0 (H) 0.0 - 100.0 pg/mL  Protime-INR     Status: None   Collection Time: 04/25/16  1:07 AM  Result Value Ref Range   Prothrombin Time 14.8 11.4 - 15.0 seconds   INR 1.14   Prepare RBC     Status: None (Preliminary result)   Collection Time: 04/25/16  2:52 AM  Result Value Ref Range   Order Confirmation ORDER PROCESSED BY BLOOD BANK   Type and screen     Status: None   Collection Time: 04/25/16  2:52 AM  Result Value Ref Range   ABO/RH(D) O POS    Antibody Screen NEG    Sample Expiration 04/28/2016    Dg Chest Port 1 View  04/25/2016  CLINICAL DATA:  82 year old female with shortness of breath EXAM: PORTABLE CHEST 1 VIEW COMPARISON:  Radiograph dated 08/29/2014 and chest CT dated 12/01/2008 FINDINGS: Single portable view of the chest demonstrates emphysematous changes of the lungs with areas of scarring at the right lung base. There is no focal consolidation, pleural effusion, or pneumothorax. Right hilar surgical clips noted. Top-normal cardiac silhouette. There is osteopenia with degenerative changes of the spine. No acute fracture. IMPRESSION: No acute cardiopulmonary process. Electronically Signed   By: Anner Crete M.D.   On: 04/25/2016 02:18    Review of Systems  Constitutional: Positive for weight loss and malaise/fatigue. Negative for fever and chills.  HENT: Negative for sore throat and tinnitus.   Eyes: Negative for blurred vision and redness.  Respiratory: Positive for shortness of breath. Negative for cough.   Cardiovascular: Negative for chest pain, palpitations, orthopnea and PND.  Gastrointestinal: Positive for abdominal pain and blood in stool. Negative  for nausea, vomiting and diarrhea.  Genitourinary: Negative for dysuria, urgency and frequency.  Musculoskeletal: Negative for myalgias and joint pain.  Skin: Negative for rash.       No lesions  Neurological: Negative for speech change, focal weakness and weakness.  Endo/Heme/Allergies: Does not bruise/bleed easily.       No temperature intolerance  Psychiatric/Behavioral: Negative for depression and suicidal ideas.    Blood pressure 98/54, pulse 77, resp. rate 22, SpO2 100 %. Physical Exam  Vitals reviewed. Constitutional: She is oriented to person, place, and time. She appears well-developed and well-nourished. No distress.  HENT:  Head: Normocephalic and atraumatic.  Mouth/Throat: Oropharynx is clear and moist.  Eyes: Conjunctivae and EOM are normal. Pupils are equal, round, and reactive to light. No scleral icterus.  Neck: Normal range of motion. Neck supple. No JVD present. No tracheal deviation present. No thyromegaly present.  Cardiovascular: Normal rate, regular rhythm and normal heart sounds.  Exam reveals no gallop and no friction rub.   No murmur heard. Respiratory: Effort normal. She has wheezes.  GI: Soft. Bowel sounds are normal. She exhibits no distension. There is no tenderness.  Genitourinary:  Deferred  Musculoskeletal: Normal range of motion. She exhibits edema.  Lymphadenopathy:    She has no cervical adenopathy.  Neurological: She is alert and oriented to person, place, and time. No cranial nerve deficit. She exhibits normal muscle tone.  Skin: Skin is warm and dry. No rash noted. No erythema.  Psychiatric: She has a normal mood and affect. Her behavior is normal. Judgment and thought content normal.     Assessment/Plan This is an 82 year old female admitted for symptomatic anemia secondary to GI bleeding and rectal mass. 1. Anemia: Multifactorial;  megaloblastic. The latter represents a bone marrow process and likely deficiency of B12. The patient has been  supplementing B12 orally as part of her home medication regimen. However, she is also losing blood from her rectum. This is also multifactorial as the patient has been using NSAIDs twice daily for months. I have stopped her etodolac. Will check methylmalonic acid as well as B12 and complete blood transfusions. 2. Rectal mass: Concerning for malignancy. Likely bleeding polyp. We'll consult GI. She is nothing by mouth 3. COPD: Wheezing on exam. It is unclear if this is a cardiac wheeze or secondary to COPD exacerbation. The patient denies cough or increased sputum production. I have ordered her inhalers per home regimen and will supply supplemental oxygen as needed to maintain oxygen saturations between 88 and 92%. 4. Raynaud's phenomenon: Continue amlodipine 5. Depression: Continue Effexor 6. DVT prophylaxis: SCDs 7. GI prophylaxis: IV pantoprazole for GI bleeding The patient is a DO NOT RESUSCITATE. Time spent on admission was inpatient care approximately 45 minutes   [name], MD 04/25/2016, 3:39 AM

## 2016-04-26 LAB — BASIC METABOLIC PANEL
ANION GAP: 6 (ref 5–15)
BUN: 34 mg/dL — AB (ref 6–20)
CALCIUM: 8.5 mg/dL — AB (ref 8.9–10.3)
CO2: 33 mmol/L — AB (ref 22–32)
CREATININE: 0.9 mg/dL (ref 0.44–1.00)
Chloride: 96 mmol/L — ABNORMAL LOW (ref 101–111)
GFR calc Af Amer: >60 mL/min (ref >60)
GFR calc non Af Amer: 55 mL/min — ABNORMAL LOW (ref >60)
GLUCOSE: 87 mg/dL (ref 65–99)
Potassium: 4 mmol/L (ref 3.5–5.1)
Sodium: 135 mmol/L (ref 135–145)

## 2016-04-26 LAB — C DIFFICILE QUICK SCREEN W PCR REFLEX
C DIFFICILE (CDIFF) TOXIN: NEGATIVE
C DIFFICLE (CDIFF) ANTIGEN: NEGATIVE
C Diff interpretation: NEGATIVE

## 2016-04-26 LAB — CBC
HEMATOCRIT: 23.8 % — AB (ref 35.0–47.0)
Hemoglobin: 7.9 g/dL — ABNORMAL LOW (ref 12.0–16.0)
MCH: 34 pg (ref 26.0–34.0)
MCHC: 33.1 g/dL (ref 32.0–36.0)
MCV: 102.7 fL — AB (ref 80.0–100.0)
Platelets: 112 10*3/uL — ABNORMAL LOW (ref 150–440)
RBC: 2.31 MIL/uL — ABNORMAL LOW (ref 3.80–5.20)
RDW: 24.3 % — AB (ref 11.5–14.5)
WBC: 12.1 10*3/uL — ABNORMAL HIGH (ref 3.6–11.0)

## 2016-04-26 LAB — BLOOD CULTURE ID PANEL (REFLEXED)
ACINETOBACTER BAUMANNII: NOT DETECTED
CANDIDA GLABRATA: NOT DETECTED
CANDIDA KRUSEI: NOT DETECTED
CANDIDA PARAPSILOSIS: NOT DETECTED
Candida albicans: NOT DETECTED
Candida tropicalis: NOT DETECTED
Carbapenem resistance: NOT DETECTED
ESCHERICHIA COLI: NOT DETECTED
Enterobacter cloacae complex: NOT DETECTED
Enterobacteriaceae species: NOT DETECTED
Enterococcus species: NOT DETECTED
Haemophilus influenzae: NOT DETECTED
KLEBSIELLA OXYTOCA: NOT DETECTED
KLEBSIELLA PNEUMONIAE: NOT DETECTED
LISTERIA MONOCYTOGENES: NOT DETECTED
Methicillin resistance: NOT DETECTED
NEISSERIA MENINGITIDIS: NOT DETECTED
Proteus species: NOT DETECTED
Pseudomonas aeruginosa: NOT DETECTED
SERRATIA MARCESCENS: NOT DETECTED
STREPTOCOCCUS AGALACTIAE: NOT DETECTED
STREPTOCOCCUS SPECIES: NOT DETECTED
Staphylococcus aureus (BCID): NOT DETECTED
Staphylococcus species: DETECTED — AB
Streptococcus pneumoniae: NOT DETECTED
Streptococcus pyogenes: NOT DETECTED
Vancomycin resistance: NOT DETECTED

## 2016-04-26 LAB — PREPARE RBC (CROSSMATCH)

## 2016-04-26 MED ORDER — SODIUM CHLORIDE 0.9 % IV SOLN: Freq: Once | INTRAVENOUS | Status: DC

## 2016-04-26 MED ORDER — DOCUSATE SODIUM 100 MG PO CAPS: 100.0000 mg | ORAL_CAPSULE | Freq: Two times a day (BID) | ORAL | Status: DC

## 2016-04-26 NOTE — Care Management Note (Addendum)
Case Management Note  Patient Details  Name: Sara Mccoy MRN: [id number] Date of Birth: [birth year]  Subjective/Objective:       Mrs Tine is currently receiving blood. This Probation officer spoke with Maricela Curet RN to request oxygen progressions so that oxygen can be ordered from [facility] as soon as numbers are obtained. Mrs Gagliardo is currently on PRN home O2 provided by [facility] and is an active client of [facility].  Call to [facility] with continuous 2L N/C orders and request for a portable tank to be delivered to Mrs Abramo in room 208 so that she can discharge to home.           Action/Plan:   Expected Discharge Date:                  Expected Discharge Plan:     In-House Referral:     Discharge planning Services     Post Acute Care Choice:    Choice offered to:     DME Arranged:    DME Agency:     HH Arranged:    [place] Agency:     Status of Service:     Medicare Important Message Given:    Date Medicare IM Given:    Medicare IM give by:    Date Additional Medicare IM Given:    Additional Medicare Important Message give by:     If discussed at [place] of Stay Meetings, dates discussed:    Additional Comments:  Daritza Brees A, RN 04/26/2016, 1:10 PM

## 2016-04-26 NOTE — Progress Notes (Signed)
[facility]  [address], [place], [place] [address] Phone: [phone] Fax : [phone]   Subjective: This patient was admitted with diarrhea and rectal bleeding. The patient had a CT scan that showed a large amount of stool in the colon and a mass in the rectum. The patient underwent a flexible sigmoidoscopy yesterday without a mass seen. The patient does report that she feels something protruding out of her rectum when she stands up which is likely rectal prolapse. The patient has not had any further bleeding. The patient also states that she's had multiple solid bowel movement since yesterday. Her diarrhea is no longer an issue.   Objective: Vital signs in last 24 hours: Filed Vitals:   04/25/16 2018 04/26/16 0351 04/26/16 0418 04/26/16 0936  BP: 136/56  130/66 114/67  Pulse: 70  80 73  Temp: 97.8 F (36.6 C)  98.3 F (36.8 C)   TempSrc: Oral  Oral   Resp: 16  18   Height:      Weight:  153 lb 14.4 oz (69.809 kg)    SpO2: 100%  97% 100%   Weight change: 3 lb 11.2 oz (1.678 kg)  Intake/Output Summary (Last 24 hours) at 04/26/16 1059 Last data filed at 04/26/16 0900  Gross per 24 hour  Intake 707.91 ml  Output   1300 ml  Net -592.09 ml     Exam: Heart:: Regular rate and rhythm Lungs: Clear to auscultation bilaterally Abdomen: soft, nontender, normal bowel sounds   Lab Results: '@LABTEST2'$ @ Micro Results: Recent Results (from the past 240 hour(s))  Culture, blood (routine x 2)     Status: None (Preliminary result)   Collection Time: 04/25/16  1:06 AM  Result Value Ref Range Status   Specimen Description BLOOD LEFT ARM  Final   Special Requests BOTTLES DRAWN AEROBIC AND ANAEROBIC [place]  Final   Culture NO GROWTH < 24 HOURS  Final   Report Status PENDING  Incomplete  Culture, blood (routine x 2)     Status: Abnormal (Preliminary result)   Collection Time: 04/25/16  1:06 AM  Result Value Ref Range Status   Specimen Description  BLOOD LEFT HAND  Final   Special Requests BOTTLES DRAWN AEROBIC AND ANAEROBIC [place]  Final   Culture  Setup Time   Final    GRAM POSITIVE COCCI AEROBIC BOTTLE ONLY CRITICAL RESULT CALLED TO, READ BACK BY AND VERIFIED WITH: MATT MCBANE AT 2240 ON 04/25/16 RWW Organism ID to follow CONFIRMED BY [facility]    Culture STAPHYLOCOCCUS SPECIES AEROBIC BOTTLE ONLY  (A)  Final   Report Status PENDING  Incomplete  Blood Culture ID Panel (Reflexed)     Status: Abnormal   Collection Time: 04/25/16  1:06 AM  Result Value Ref Range Status   Enterococcus species NOT DETECTED NOT DETECTED Final   Vancomycin resistance NOT DETECTED NOT DETECTED Final   Listeria monocytogenes NOT DETECTED NOT DETECTED Final   Staphylococcus species DETECTED (A) NOT DETECTED Final    Comment: CRITICAL RESULT CALLED TO, READ BACK BY AND VERIFIED WITH: CALLED MATT MCBANE AT 2240 ON 04/25/16 RWW    Staphylococcus aureus NOT DETECTED NOT DETECTED Final   Methicillin resistance NOT DETECTED NOT DETECTED Final   Streptococcus species NOT DETECTED NOT DETECTED Final   Streptococcus agalactiae NOT DETECTED NOT DETECTED Final   Streptococcus pneumoniae NOT DETECTED NOT DETECTED Final   Streptococcus pyogenes NOT DETECTED NOT DETECTED Final   Acinetobacter baumannii NOT DETECTED NOT DETECTED Final   Enterobacteriaceae  species NOT DETECTED NOT DETECTED Final   Enterobacter cloacae complex NOT DETECTED NOT DETECTED Final   Escherichia coli NOT DETECTED NOT DETECTED Final   Klebsiella oxytoca NOT DETECTED NOT DETECTED Final   Klebsiella pneumoniae NOT DETECTED NOT DETECTED Final   Proteus species NOT DETECTED NOT DETECTED Final   Serratia marcescens NOT DETECTED NOT DETECTED Final   Carbapenem resistance NOT DETECTED NOT DETECTED Final   Haemophilus influenzae NOT DETECTED NOT DETECTED Final   Neisseria meningitidis NOT DETECTED NOT DETECTED Final   Pseudomonas aeruginosa NOT DETECTED NOT DETECTED Final   Candida albicans  NOT DETECTED NOT DETECTED Final   Candida glabrata NOT DETECTED NOT DETECTED Final   Candida krusei NOT DETECTED NOT DETECTED Final   Candida parapsilosis NOT DETECTED NOT DETECTED Final   Candida tropicalis NOT DETECTED NOT DETECTED Final  Gastrointestinal Panel by PCR , Stool     Status: None   Collection Time: 04/25/16  8:06 PM  Result Value Ref Range Status   Campylobacter species NOT DETECTED NOT DETECTED Final   Plesimonas shigelloides NOT DETECTED NOT DETECTED Final   Salmonella species NOT DETECTED NOT DETECTED Final   Yersinia enterocolitica NOT DETECTED NOT DETECTED Final   Vibrio species NOT DETECTED NOT DETECTED Final   Vibrio cholerae NOT DETECTED NOT DETECTED Final   Enteroaggregative E coli (EAEC) NOT DETECTED NOT DETECTED Final   Enteropathogenic E coli (EPEC) NOT DETECTED NOT DETECTED Final   Enterotoxigenic E coli (ETEC) NOT DETECTED NOT DETECTED Final   Shiga like toxin producing E coli (STEC) NOT DETECTED NOT DETECTED Final   E. coli O157 NOT DETECTED NOT DETECTED Final   Shigella/Enteroinvasive E coli (EIEC) NOT DETECTED NOT DETECTED Final   Cryptosporidium NOT DETECTED NOT DETECTED Final   Cyclospora cayetanensis NOT DETECTED NOT DETECTED Final   Entamoeba histolytica NOT DETECTED NOT DETECTED Final   Giardia lamblia NOT DETECTED NOT DETECTED Final   Adenovirus F40/41 NOT DETECTED NOT DETECTED Final   Astrovirus NOT DETECTED NOT DETECTED Final   Norovirus GI/GII NOT DETECTED NOT DETECTED Final   Rotavirus A NOT DETECTED NOT DETECTED Final   Sapovirus (I, II, IV, and V) NOT DETECTED NOT DETECTED Final  C difficile quick scan w PCR reflex     Status: None   Collection Time: 04/25/16  8:06 PM  Result Value Ref Range Status   C Diff antigen NEGATIVE NEGATIVE Final   C Diff toxin NEGATIVE NEGATIVE Final   C Diff interpretation Negative for C. difficile  Corrected    Comment: C/KELLY ZIELKE AT 2057 04/25/16.[facility] CORRECTED ON 05/21 AT 0053: PREVIOUSLY REPORTED AS  VALID    Studies/Results: Ct Abdomen Pelvis W Contrast  04/25/2016  CLINICAL DATA:  82 year old with rectal bleeding and possible rectal mass on physical examination. Approximate 4 month history of diarrhea. EXAM: CT ABDOMEN AND PELVIS WITH CONTRAST TECHNIQUE: Multidetector CT imaging of the abdomen and pelvis was performed using the standard protocol following bolus administration of intravenous contrast. CONTRAST:  61m ISOVUE-300 IOPAMIDOL 61% IV. Oral contrast was also administered. COMPARISON:  None. FINDINGS: Lower chest: Mild scarring involving the visualized lower lobes and right middle lobe. Visualized lung bases otherwise clear. Prominent subpleural fat at the bases. Heart enlarged. Mild aortic valvular calcification. Hepatobiliary: Dystrophic calcification in the right lobe of the liver the dome. Calcified granuloma in the posterior segment right lobe of liver. No significant focal hepatic parenchymal abnormality. Surgically absent gallbladder. Mild intra and extrahepatic biliary ductal dilation, not unexpected. No visible bile duct stone  or obstructing mass. Pancreas: Normal in appearance without evidence of mass, ductal dilation, or inflammation. Spleen: Calcified granuloma in the otherwise normal-appearing spleen. Adrenals/Urinary Tract: Left adrenal enlargement without discrete nodule or mass. Normal right adrenal gland. Bilateral parapelvic renal cysts and scattered cortical cysts. No solid renal masses. No hydronephrosis. No urinary tract calculi. Urinary bladder decompressed and unremarkable. Stomach/Bowel: Small hiatal hernia. Oral contrast material in the dilated distal esophagus. Stomach decompressed and otherwise unremarkable. Normal-appearing small bowel. Circumferential mass involving the distal rectum measuring approximately 4 cm without evidence of colonic obstruction. Large colonic stool burden. Extensive descending and sigmoid colon diverticulosis without evidence of acute  diverticulitis. Appendix not clearly demonstrated, but no pericecal inflammation. Vascular/Lymphatic: Severe aortoiliofemoral atherosclerosis without aneurysm. Visceral arteries atherosclerotic though patient. No pathologic lymphadenopathy. Reproductive: Surgically absent uterus. Normal-appearing right ovary. Approximate 2.6 x 1.8 x 3.7 cm cyst arising from the left ovary. Other: None. Musculoskeletal: Likely remote severe compression fracture of L2 with significant bony retropulsion and severe spinal stenosis at this level as a result. Lower thoracic degenerative disc disease and spondylosis. Degenerative disc disease at L5-S1. Diffuse facet degenerative changes. Osseous demineralization. No definite acute abnormality. IMPRESSION: 1. Circumferential mass involving the distal rectum measuring approximately 4 cm. No evidence of colonic obstruction. 2. Extensive descending and sigmoid colon diverticulosis without evidence of acute diverticulitis. 3. Large colonic stool burden. 4. Approximate 3.7 cm cyst arising from the left ovary. Pelvic ultrasound is recommended in further evaluation. This recommendation follows ACR consensus guidelines: White Paper of the ACR Incidental Findings Committee II on Adnexal Findings. J Am Coll Radiol 2013:10:675-681. 5. Small hiatal hernia. Gastroesophageal reflux and/or dysmotility with oral contrast material in the distal esophagus. 6. Likely remote severe compression fracture of L2 with retropulsion and severe spinal stenosis. Electronically Signed   By: Evangeline Dakin M.D.   On: 04/25/2016 19:30   Dg Chest Port 1 View  04/25/2016  CLINICAL DATA:  82 year old female with shortness of breath EXAM: PORTABLE CHEST 1 VIEW COMPARISON:  Radiograph dated 08/29/2014 and chest CT dated 12/01/2008 FINDINGS: Single portable view of the chest demonstrates emphysematous changes of the lungs with areas of scarring at the right lung base. There is no focal consolidation, pleural effusion, or  pneumothorax. Right hilar surgical clips noted. Top-normal cardiac silhouette. There is osteopenia with degenerative changes of the spine. No acute fracture. IMPRESSION: No acute cardiopulmonary process. Electronically Signed   By: Anner Crete M.D.   On: 04/25/2016 02:18   Medications: I have reviewed the patient's current medications. Scheduled Meds: . sodium chloride   Intravenous Once  . amLODipine  5 mg Oral Daily  . antiseptic oral rinse  7 mL Mouth Rinse BID  . B-complex with vitamin C  1 tablet Oral Daily  . calcium-vitamin D  1 tablet Oral BID  . cholecalciferol  1,000 Units Oral Daily  . docusate sodium  100 mg Oral BID  . mometasone-formoterol  2 puff Inhalation BID  . multivitamin with minerals  1 tablet Oral Daily  . pantoprazole (PROTONIX) IV  40 mg Intravenous Q12H  . sodium chloride flush  3 mL Intravenous Q12H  . venlafaxine  37.5 mg Oral BID  . vitamin C  1,000 mg Oral Daily  . vitamin E  400 Units Oral Daily   Continuous Infusions:  PRN Meds:.acetaminophen **OR** acetaminophen, ALPRAZolam, ipratropium-albuterol, morphine injection, ondansetron **OR** ondansetron (ZOFRAN) IV   Assessment: Active Problems:   GI bleed   Abdominal mass   Absolute anemia   Blood in  stool   Diverticulosis of large intestine without diverticulitis    Plan: This patient was admitted with rectal bleeding and a mass on CT scan in her rectum. The patient had a Flexima sigmoidoscopy that showed extensive diverticulosis with solid stools. The patient was likely having overflow diarrhea. The patient has no further diarrhea and her hemoglobin is low but not significantly lower than it was yesterday. The patient has no complaints today. The patient has been told that she will likely need follow-up as an outpatient. The patient's rectal exam also did not show any mass as seen on the CT scan. Patient has been explained the plan and agrees with it.    LOS: 1 day   Sara Mccoy 04/26/2016,  10:59 AM

## 2016-04-26 NOTE — Progress Notes (Signed)
Patient sat in bed without oxygen and her oxygen saturation was 92% Patient up to bathroom without oxygen and oxygen saturation went down to 73% Patient ambulated to bed from bathroom with oxygen and oxygen saturation went up to 88% on 2L ambulating while pushing iv pole and immediately after sitting oxygen saturation on 2 L was 92% After resting, for just a minute, the oxygen saturation was 97% on 2L. Lauris Poag, RN 04/26/16 at [phone]

## 2016-04-27 LAB — TYPE AND SCREEN
ABO/RH(D): O POS
Antibody Screen: NEGATIVE
Unit division: 0
Unit division: 0
Unit division: 0

## 2016-04-27 NOTE — Discharge Summary (Signed)
[facility] NAME: Sara Mccoy    MR#:  [id number]  DATE OF BIRTH:  12-11-1925  DATE OF ADMISSION:  04/25/2016 ADMITTING PHYSICIAN: [name], MD  DATE OF DISCHARGE: 04/26/2016  4:38 PM  PRIMARY CARE PHYSICIAN: Tracie Harrier, MD    ADMISSION DIAGNOSIS:  Anemia, unspecified anemia type [D64.9] Gastrointestinal hemorrhage, unspecified gastritis, unspecified gastrointestinal hemorrhage type [K92.2] Diarrhea, unspecified type [R19.7] Chronic obstructive pulmonary disease, unspecified COPD type ([place]) [J44.9]  DISCHARGE DIAGNOSIS:  Active Problems:   GI bleed   Abdominal mass   Absolute anemia   Blood in stool   Diverticulosis of large intestine without diverticulitis   SECONDARY DIAGNOSIS:   Past Medical History  Diagnosis Date  . COPD (chronic obstructive pulmonary disease) ([place])   . Anemia   . Raynaud's disease   . Lung cancer, upper lobe ([place]) 1997    right upper lobe    HOSPITAL COURSE:   82 year old female with past medical history significant for COPD, Raynaud's disease, chronic anemia presents to the hospital secondary to rectal bleed.  #1 Rectal mass- Initially thought to be rectal mass based on the CT finding.[facility] Appreciate GI consult. -However after flex sigmoidoscopy, it was determined to be rectal prolapse. No active bleeding sites identified.  #2 acute on chronic patient has chronic megaloblastic anemia. She is being followed at the cancer center. Her other workup has turned out to be negative for anemia. -Patient has refused bone marrow biopsy at this time. Plan is to support her anemia with transfusions to keep her hemoglobin greater than 8. -She has received 2 unit transfusion in the hospital -Last hemoglobin in the computer was 7.9, however she did receive 1 more unit of transfusion after that.   #3 COPD-continue inhalers. No wheezing on exam.  -Will chronically needing 2 L of oxygen  continuous.  #4 history of right upper lobe lung cancer-status post resection of right upper lobe several years ago. Stable  #5 depression-continue Effexor  Patient being discharged home today.   DISCHARGE CONDITIONS:   Stable  CONSULTS OBTAINED:  Treatment Team:  Lucilla Lame, MD  DRUG ALLERGIES:  No Known Allergies  DISCHARGE MEDICATIONS:   Discharge Medication List as of 04/26/2016  4:11 PM    START taking these medications   Details  docusate sodium (COLACE) 100 MG capsule Take 1 capsule (100 mg total) by mouth 2 (two) times daily., Starting 04/26/2016, Until Discontinued, Normal      CONTINUE these medications which have NOT CHANGED   Details  ALPRAZolam (XANAX) 0.5 MG tablet Take 0.5 mg by mouth daily as needed for anxiety., Until Discontinued, Historical Med    amLODipine (NORVASC) 5 MG tablet Take 5 mg by mouth daily., Until Discontinued, Historical Med    Ascorbic Acid (VITAMIN C) 1000 MG tablet Take 1,000 mg by mouth daily., Until Discontinued, Historical Med    b complex vitamins capsule Take 1 capsule by mouth daily., Until Discontinued, Historical Med    Calcium Carb-Cholecalciferol (CALCIUM + D3) 600-200 MG-UNIT TABS Take 1 tablet by mouth 2 (two) times daily., Until Discontinued, Historical Med    cholecalciferol (VITAMIN D) 1000 units tablet Take 1,000 Units by mouth daily., Until Discontinued, Historical Med    Fluticasone-Salmeterol (ADVAIR) 500-50 MCG/DOSE AEPB Inhale 1 puff into the lungs 2 (two) times daily., Until Discontinued, Historical Med    ipratropium-albuterol (DUONEB) 0.5-2.5 (3) MG/3ML SOLN Take 3 mLs by nebulization every 6 (six) hours as needed., Until Discontinued,  Historical Med    Multiple Vitamin (MULTIVITAMIN WITH MINERALS) TABS tablet Take 1 tablet by mouth daily., Until Discontinued, Historical Med    omeprazole (PRILOSEC) 40 MG capsule Take 40 mg by mouth daily., Until Discontinued, Historical Med    oxyCODONE-acetaminophen  (PERCOCET/ROXICET) 5-325 MG tablet Take by mouth 2 (two) times daily as needed for severe pain., Until Discontinued, Historical Med    venlafaxine (EFFEXOR) 37.5 MG tablet Take 37.5 mg by mouth 2 (two) times daily., Until Discontinued, Historical Med    vitamin E 400 UNIT capsule Take 400 Units by mouth daily., Until Discontinued, Historical Med      STOP taking these medications     etodolac (LODINE) 400 MG tablet          DISCHARGE INSTRUCTIONS:   1. Hematology f/u in 1 week 2. PCP f/u in 1-2 weeks 3. Continuous home oxygen  If you experience worsening of your admission symptoms, develop shortness of breath, life threatening emergency, suicidal or homicidal thoughts you must seek medical attention immediately by calling 911 or calling your MD immediately  if symptoms less severe.  You Must read complete instructions/literature along with all the possible adverse reactions/side effects for all the Medicines you take and that have been prescribed to you. Take any new Medicines after you have completely understood and accept all the possible adverse reactions/side effects.   Please note  You were cared for by a hospitalist during your hospital stay. If you have any questions about your discharge medications or the care you received while you were in the hospital after you are discharged, you can call the unit and asked to speak with the hospitalist on call if the hospitalist that took care of you is not available. Once you are discharged, your primary care physician will handle any further medical issues. Please note that NO REFILLS for any discharge medications will be authorized once you are discharged, as it is imperative that you return to your primary care physician (or establish a relationship with a primary care physician if you do not have one) for your aftercare needs so that they can reassess your need for medications and monitor your lab values.    Today   CHIEF COMPLAINT:    Chief Complaint  Patient presents with  . Shortness of Breath    VITAL SIGNS:  Blood pressure 146/62, pulse 73, temperature 98.1 F (36.7 C), temperature source Oral, resp. rate 20, height _0  (1.549 m), weight 69.809 kg (153 lb 14.4 oz), SpO2 100 %.  I/O:  No intake or output data in the 24 hours ending 04/27/16 1525  PHYSICAL EXAMINATION:   Physical Exam  GENERAL: 82 y.o.-year-old patient lying in the bed with no acute distress.  EYES: Pupils equal, round, reactive to light and accommodation. No scleral icterus. Extraocular muscles intact.  HEENT: Head atraumatic, normocephalic. Oropharynx and nasopharynx clear.  NECK: Supple, no jugular venous distention. No thyroid enlargement, no tenderness.  LUNGS: Normal breath sounds bilaterally, no wheezing, rales,rhonchi or crepitation. No use of accessory muscles of respiration.  CARDIOVASCULAR: S1, S2 normal. No murmurs, rubs, or gallops.  ABDOMEN: Soft, nontender, nondistended. Bowel sounds present. No organomegaly or mass.  EXTREMITIES: No pedal edema, cyanosis, or clubbing.  NEUROLOGIC: Cranial nerves II through XII are intact. Muscle strength 5/5 in all extremities. Sensation intact. Gait not checked.  PSYCHIATRIC: The patient is alert and oriented x 3.  SKIN: No obvious rash, lesion, or ulcer.   DATA REVIEW:   CBC  Recent  Labs Lab 04/26/16 0614  WBC 12.1*  HGB 7.9*  HCT 23.8*  PLT 112*    Chemistries   Recent Labs Lab 04/26/16 0614  NA 135  K 4.0  CL 96*  CO2 33*  GLUCOSE 87  BUN 34*  CREATININE 0.90  CALCIUM 8.5*    Cardiac Enzymes  Recent Labs Lab 04/25/16 0106  TROPONINI 0.04*    Microbiology Results  Results for orders placed or performed during the hospital encounter of 04/25/16  Culture, blood (routine x 2)     Status: None (Preliminary result)   Collection Time: 04/25/16  1:06 AM  Result Value Ref Range Status   Specimen Description BLOOD LEFT ARM  Final   Special Requests  BOTTLES DRAWN AEROBIC AND ANAEROBIC [place]  Final   Culture NO GROWTH 1 DAY  Final   Report Status PENDING  Incomplete  Culture, blood (routine x 2)     Status: Abnormal (Preliminary result)   Collection Time: 04/25/16  1:06 AM  Result Value Ref Range Status   Specimen Description BLOOD LEFT HAND  Final   Special Requests BOTTLES DRAWN AEROBIC AND ANAEROBIC [place]  Final   Culture  Setup Time   Final    GRAM POSITIVE COCCI AEROBIC BOTTLE ONLY CRITICAL RESULT CALLED TO, READ BACK BY AND VERIFIED WITH: MATT MCBANE AT 2240 ON 04/25/16 [facility] Organism ID to follow CONFIRMED BY [facility]    Culture (A)  Final    STAPHYLOCOCCUS SPECIES AEROBIC BOTTLE ONLY Results consistent with contamination.    Report Status PENDING  Incomplete  Blood Culture ID Panel (Reflexed)     Status: Abnormal   Collection Time: 04/25/16  1:06 AM  Result Value Ref Range Status   Enterococcus species NOT DETECTED NOT DETECTED Final   Vancomycin resistance NOT DETECTED NOT DETECTED Final   Listeria monocytogenes NOT DETECTED NOT DETECTED Final   Staphylococcus species DETECTED (A) NOT DETECTED Final    Comment: CRITICAL RESULT CALLED TO, READ BACK BY AND VERIFIED WITH: CALLED MATT MCBANE AT 2240 ON 04/25/16 [facility]    Staphylococcus aureus NOT DETECTED NOT DETECTED Final   Methicillin resistance NOT DETECTED NOT DETECTED Final   Streptococcus species NOT DETECTED NOT DETECTED Final   Streptococcus agalactiae NOT DETECTED NOT DETECTED Final   Streptococcus pneumoniae NOT DETECTED NOT DETECTED Final   Streptococcus pyogenes NOT DETECTED NOT DETECTED Final   Acinetobacter baumannii NOT DETECTED NOT DETECTED Final   Enterobacteriaceae species NOT DETECTED NOT DETECTED Final   Enterobacter cloacae complex NOT DETECTED NOT DETECTED Final   Escherichia coli NOT DETECTED NOT DETECTED Final   Klebsiella oxytoca NOT DETECTED NOT DETECTED Final   Klebsiella pneumoniae NOT DETECTED NOT DETECTED Final   Proteus species  NOT DETECTED NOT DETECTED Final   Serratia marcescens NOT DETECTED NOT DETECTED Final   Carbapenem resistance NOT DETECTED NOT DETECTED Final   Haemophilus influenzae NOT DETECTED NOT DETECTED Final   Neisseria meningitidis NOT DETECTED NOT DETECTED Final   Pseudomonas aeruginosa NOT DETECTED NOT DETECTED Final   Candida albicans NOT DETECTED NOT DETECTED Final   Candida glabrata NOT DETECTED NOT DETECTED Final   Candida krusei NOT DETECTED NOT DETECTED Final   Candida parapsilosis NOT DETECTED NOT DETECTED Final   Candida tropicalis NOT DETECTED NOT DETECTED Final  Gastrointestinal Panel by PCR , Stool     Status: None   Collection Time: 04/25/16  8:06 PM  Result Value Ref Range Status   Campylobacter species NOT DETECTED NOT DETECTED Final   Plesimonas  shigelloides NOT DETECTED NOT DETECTED Final   Salmonella species NOT DETECTED NOT DETECTED Final   Yersinia enterocolitica NOT DETECTED NOT DETECTED Final   Vibrio species NOT DETECTED NOT DETECTED Final   Vibrio cholerae NOT DETECTED NOT DETECTED Final   Enteroaggregative E coli (EAEC) NOT DETECTED NOT DETECTED Final   Enteropathogenic E coli (EPEC) NOT DETECTED NOT DETECTED Final   Enterotoxigenic E coli (ETEC) NOT DETECTED NOT DETECTED Final   Shiga like toxin producing E coli (STEC) NOT DETECTED NOT DETECTED Final   E. coli O157 NOT DETECTED NOT DETECTED Final   Shigella/Enteroinvasive E coli (EIEC) NOT DETECTED NOT DETECTED Final   Cryptosporidium NOT DETECTED NOT DETECTED Final   Cyclospora cayetanensis NOT DETECTED NOT DETECTED Final   Entamoeba histolytica NOT DETECTED NOT DETECTED Final   Giardia lamblia NOT DETECTED NOT DETECTED Final   Adenovirus F40/41 NOT DETECTED NOT DETECTED Final   Astrovirus NOT DETECTED NOT DETECTED Final   Norovirus GI/GII NOT DETECTED NOT DETECTED Final   Rotavirus A NOT DETECTED NOT DETECTED Final   Sapovirus (I, II, IV, and V) NOT DETECTED NOT DETECTED Final  C difficile quick scan w PCR  reflex     Status: None   Collection Time: 04/25/16  8:06 PM  Result Value Ref Range Status   C Diff antigen NEGATIVE NEGATIVE Final   C Diff toxin NEGATIVE NEGATIVE Final   C Diff interpretation Negative for C. difficile  Corrected    Comment: C/KELLY ZIELKE AT 2057 04/25/16.[facility] CORRECTED ON 05/21 AT 0053: PREVIOUSLY REPORTED AS VALID     RADIOLOGY:  Ct Abdomen Pelvis W Contrast  04/25/2016  CLINICAL DATA:  82 year old with rectal bleeding and possible rectal mass on physical examination. Approximate 4 month history of diarrhea. EXAM: CT ABDOMEN AND PELVIS WITH CONTRAST TECHNIQUE: Multidetector CT imaging of the abdomen and pelvis was performed using the standard protocol following bolus administration of intravenous contrast. CONTRAST:  17m ISOVUE-300 IOPAMIDOL 61% IV. Oral contrast was also administered. COMPARISON:  None. FINDINGS: Lower chest: Mild scarring involving the visualized lower lobes and right middle lobe. Visualized lung bases otherwise clear. Prominent subpleural fat at the bases. Heart enlarged. Mild aortic valvular calcification. Hepatobiliary: Dystrophic calcification in the right lobe of the liver the dome. Calcified granuloma in the posterior segment right lobe of liver. No significant focal hepatic parenchymal abnormality. Surgically absent gallbladder. Mild intra and extrahepatic biliary ductal dilation, not unexpected. No visible bile duct stone or obstructing mass. Pancreas: Normal in appearance without evidence of mass, ductal dilation, or inflammation. Spleen: Calcified granuloma in the otherwise normal-appearing spleen. Adrenals/Urinary Tract: Left adrenal enlargement without discrete nodule or mass. Normal right adrenal gland. Bilateral parapelvic renal cysts and scattered cortical cysts. No solid renal masses. No hydronephrosis. No urinary tract calculi. Urinary bladder decompressed and unremarkable. Stomach/Bowel: Small hiatal hernia. Oral contrast material in the dilated  distal esophagus. Stomach decompressed and otherwise unremarkable. Normal-appearing small bowel. Circumferential mass involving the distal rectum measuring approximately 4 cm without evidence of colonic obstruction. Large colonic stool burden. Extensive descending and sigmoid colon diverticulosis without evidence of acute diverticulitis. Appendix not clearly demonstrated, but no pericecal inflammation. Vascular/Lymphatic: Severe aortoiliofemoral atherosclerosis without aneurysm. Visceral arteries atherosclerotic though patient. No pathologic lymphadenopathy. Reproductive: Surgically absent uterus. Normal-appearing right ovary. Approximate 2.6 x 1.8 x 3.7 cm cyst arising from the left ovary. Other: None. Musculoskeletal: Likely remote severe compression fracture of L2 with significant bony retropulsion and severe spinal stenosis at this level as a result. Lower thoracic degenerative  disc disease and spondylosis. Degenerative disc disease at L5-S1. Diffuse facet degenerative changes. Osseous demineralization. No definite acute abnormality. IMPRESSION: 1. Circumferential mass involving the distal rectum measuring approximately 4 cm. No evidence of colonic obstruction. 2. Extensive descending and sigmoid colon diverticulosis without evidence of acute diverticulitis. 3. Large colonic stool burden. 4. Approximate 3.7 cm cyst arising from the left ovary. Pelvic ultrasound is recommended in further evaluation. This recommendation follows ACR consensus guidelines: White Paper of the ACR Incidental Findings Committee II on Adnexal Findings. J Am Coll Radiol 2013:10:675-681. 5. Small hiatal hernia. Gastroesophageal reflux and/or dysmotility with oral contrast material in the distal esophagus. 6. Likely remote severe compression fracture of L2 with retropulsion and severe spinal stenosis. Electronically Signed   By: Evangeline Dakin M.D.   On: 04/25/2016 19:30    EKG:   Orders placed or performed during the hospital  encounter of 04/25/16  . ED EKG  . ED EKG  . EKG 12-Lead  . EKG 12-Lead      Management plans discussed with the patient, family and they are in agreement.  CODE STATUS:  Code Status History    Date Active Date Inactive Code Status Order ID Comments User Context   04/25/2016  3:51 AM 04/26/2016  7:38 PM DNR [id number]  [name], MD Inpatient   04/25/2016  3:29 AM 04/25/2016  3:29 AM Full Code [id number]  [name], MD ED    Questions for Most Recent Historical Code Status (Order [id number])    Question Answer Comment   In the event of cardiac or respiratory ARREST Do not call a "code blue"    In the event of cardiac or respiratory ARREST Do not perform Intubation, CPR, defibrillation or ACLS    In the event of cardiac or respiratory ARREST Use medication by any route, position, wound care, and other measures to relive pain and suffering. May use oxygen, suction and manual treatment of airway obstruction as needed for comfort.     Advance Directive Documentation        Most Recent Value   Type of Advance Directive  Living will, Healthcare Power of Attorney   Pre-existing out of facility DNR order (yellow form or pink MOST form)     "MOST" Form in Place?        TOTAL TIME TAKING CARE OF THIS PATIENT: 37 minutes.    Gladstone Lighter M.D on 04/27/2016 at 3:25 PM  Between 7am to 6pm - Pager - [phone]  After 6pm go to [email or web address] - password [email or web address] [facility]  Office  [phone]  CC: Primary care physician; Tracie Harrier, MD

## 2016-04-28 ENCOUNTER — Encounter: Payer: Self-pay | Admitting: Gastroenterology

## 2016-04-28 LAB — METHYLMALONIC ACID, SERUM: Methylmalonic Acid, Quantitative: 322 nmol/L (ref 0–378)

## 2016-04-30 LAB — CULTURE, BLOOD (ROUTINE X 2): CULTURE: NO GROWTH

## 2016-05-15 ENCOUNTER — Emergency Department
Admission: EM | Admit: 2016-05-15 | Discharge: 2016-05-15 | Disposition: A | Discharge status: Home / Self Care | Payer: PPO | Attending: Emergency Medicine | Admitting: Emergency Medicine

## 2016-05-15 ENCOUNTER — Emergency Department: Payer: PPO

## 2016-05-15 DIAGNOSIS — J441 Chronic obstructive pulmonary disease with (acute) exacerbation: Secondary | ICD-10-CM | POA: Diagnosis not present

## 2016-05-15 DIAGNOSIS — Z85118 Personal history of other malignant neoplasm of bronchus and lung: Secondary | ICD-10-CM | POA: Diagnosis not present

## 2016-05-15 DIAGNOSIS — Z79899 Other long term (current) drug therapy: Secondary | ICD-10-CM | POA: Insufficient documentation

## 2016-05-15 DIAGNOSIS — Z87891 Personal history of nicotine dependence: Secondary | ICD-10-CM | POA: Insufficient documentation

## 2016-05-15 DIAGNOSIS — R0602 Shortness of breath: Secondary | ICD-10-CM | POA: Diagnosis present

## 2016-05-15 LAB — CBC WITH DIFFERENTIAL/PLATELET
BASOS ABS: 0.1 10*3/uL (ref 0–0.1)
Eosinophils Absolute: 0 10*3/uL (ref 0–0.7)
Eosinophils Relative: 0 %
HEMATOCRIT: 27.3 % — AB (ref 35.0–47.0)
HEMOGLOBIN: 8.9 g/dL — AB (ref 12.0–16.0)
Lymphocytes Relative: 5 %
Lymphs Abs: 0.6 10*3/uL — ABNORMAL LOW (ref 1.0–3.6)
MCH: 34.6 pg — ABNORMAL HIGH (ref 26.0–34.0)
MCHC: 32.4 g/dL (ref 32.0–36.0)
MCV: 106.7 fL — AB (ref 80.0–100.0)
MONO ABS: 1.1 10*3/uL — AB (ref 0.2–0.9)
Monocytes Relative: 9 %
NEUTROS ABS: 10.2 10*3/uL — AB (ref 1.4–6.5)
Neutrophils Relative %: 85 %
Platelets: 124 10*3/uL — ABNORMAL LOW (ref 150–440)
RBC: 2.56 MIL/uL — ABNORMAL LOW (ref 3.80–5.20)
RDW: 19.9 % — AB (ref 11.5–14.5)
WBC: 12 10*3/uL — ABNORMAL HIGH (ref 3.6–11.0)

## 2016-05-15 LAB — COMPREHENSIVE METABOLIC PANEL
ALK PHOS: 88 U/L (ref 38–126)
ALT: 20 U/L (ref 14–54)
AST: 21 U/L (ref 15–41)
Albumin: 3 g/dL — ABNORMAL LOW (ref 3.5–5.0)
Anion gap: 11 (ref 5–15)
BUN: 31 mg/dL — ABNORMAL HIGH (ref 6–20)
CALCIUM: 8.5 mg/dL — AB (ref 8.9–10.3)
CHLORIDE: 100 mmol/L — AB (ref 101–111)
CO2: 27 mmol/L (ref 22–32)
CREATININE: 0.95 mg/dL (ref 0.44–1.00)
GFR, EST NON AFRICAN AMERICAN: 52 mL/min — AB (ref >60)
Glucose, Bld: 128 mg/dL — ABNORMAL HIGH (ref 65–99)
Potassium: 4.9 mmol/L (ref 3.5–5.1)
Sodium: 138 mmol/L (ref 135–145)
TOTAL PROTEIN: 6.7 g/dL (ref 6.5–8.1)
Total Bilirubin: 0.9 mg/dL (ref 0.3–1.2)

## 2016-05-15 LAB — TROPONIN I
TROPONIN I: 0.05 ng/mL — AB (ref <0.031)
Troponin I: 0.05 ng/mL — ABNORMAL HIGH (ref <0.031)

## 2016-05-15 LAB — BRAIN NATRIURETIC PEPTIDE: B NATRIURETIC PEPTIDE 5: 363 pg/mL — AB (ref 0.0–100.0)

## 2016-05-15 MED ORDER — PREDNISONE 20 MG PO TABS
60.0000 mg | ORAL_TABLET | Freq: Once | ORAL | Status: AC
  Administered 2016-05-15: 60 mg via ORAL
  Filled 2016-05-15: qty 3

## 2016-05-15 MED ORDER — AZITHROMYCIN 500 MG PO TABS
500.0000 mg | ORAL_TABLET | Freq: Once | ORAL | Status: AC
  Administered 2016-05-15: 500 mg via ORAL
  Filled 2016-05-15: qty 1

## 2016-05-15 MED ORDER — IPRATROPIUM-ALBUTEROL 0.5-2.5 (3) MG/3ML IN SOLN
3.0000 mL | Freq: Once | RESPIRATORY_TRACT | Status: AC
  Administered 2016-05-15: 3 mL via RESPIRATORY_TRACT
  Filled 2016-05-15: qty 3

## 2016-05-15 MED ORDER — PIPERACILLIN-TAZOBACTAM 3.375 G IVPB 30 MIN
3.3750 g | Freq: Once | INTRAVENOUS | Status: AC
  Administered 2016-05-15: 3.375 g via INTRAVENOUS
  Filled 2016-05-15: qty 50

## 2016-05-15 NOTE — ED Provider Notes (Addendum)
[facility] Emergency Department Provider Note   ____________________________________________  Time seen: Approximately 6:04 PM  I have reviewed the triage vital signs and the nursing notes.   HISTORY  Chief Complaint Shortness of Breath    HPI Sara Mccoy is a 82 y.o. female who complains of about 2 days' worth of increasing shortness of breath. She has COPD and reports this is what happens when she gets worse with her COPD. She is also coughing up some brown phlegm. She does not have any fever. She does say she has some chest tightness. Chest tightness is diffuse. She usually wears 2 L of oxygen and has been doing so.   Past Medical History  Diagnosis Date  . COPD (chronic obstructive pulmonary disease) ([place])   . Anemia   . Raynaud's disease   . Lung cancer, upper lobe ([place]) 1997    right upper lobe    Patient Active Problem List   Diagnosis Date Noted  . GI bleed 04/25/2016  . Abdominal mass   . Absolute anemia   . Blood in stool   . Diverticulosis of large intestine without diverticulitis     Past Surgical History  Procedure Laterality Date  . Abdominal hysterectomy    . Lung surgery Right 1997    RUL removed  . Flexible sigmoidoscopy N/A 04/25/2016    Procedure: FLEXIBLE SIGMOIDOSCOPY;  Surgeon: Lucilla Lame, MD;  Location: [facility] ENDOSCOPY;  Service: Endoscopy;  Laterality: N/A;    Current Outpatient Rx  Name  Route  Sig  Dispense  Refill  . ALPRAZolam (XANAX) 0.5 MG tablet   Oral   Take 0.5 mg by mouth daily as needed for anxiety.         Marland Kitchen amLODipine (NORVASC) 5 MG tablet   Oral   Take 5 mg by mouth daily.         . Ascorbic Acid (VITAMIN C) 1000 MG tablet   Oral   Take 1,000 mg by mouth daily.         Marland Kitchen b complex vitamins capsule   Oral   Take 1 capsule by mouth daily.         . Calcium Carb-Cholecalciferol (CALCIUM + D3) 600-200 MG-UNIT TABS   Oral   Take 1 tablet by mouth 2 (two) times daily.         . cholecalciferol (VITAMIN D) 1000 units tablet   Oral   Take 1,000 Units by mouth daily.         Marland Kitchen docusate sodium (COLACE) 100 MG capsule   Oral   Take 1 capsule (100 mg total) by mouth 2 (two) times daily.   60 capsule   2   . Fluticasone-Salmeterol (ADVAIR) 500-50 MCG/DOSE AEPB   Inhalation   Inhale 1 puff into the lungs 2 (two) times daily.         Marland Kitchen ipratropium-albuterol (DUONEB) 0.5-2.5 (3) MG/3ML SOLN   Nebulization   Take 3 mLs by nebulization every 6 (six) hours as needed.         . Multiple Vitamin (MULTIVITAMIN WITH MINERALS) TABS tablet   Oral   Take 1 tablet by mouth daily.         Marland Kitchen omeprazole (PRILOSEC) 40 MG capsule   Oral   Take 40 mg by mouth daily.         Marland Kitchen oxyCODONE-acetaminophen (PERCOCET/ROXICET) 5-325 MG tablet   Oral   Take by mouth 2 (two) times daily as needed for severe pain.         Marland Kitchen  venlafaxine (EFFEXOR) 37.5 MG tablet   Oral   Take 37.5 mg by mouth 2 (two) times daily.         . vitamin E 400 UNIT capsule   Oral   Take 400 Units by mouth daily.           Allergies Review of patient's allergies indicates no known allergies.  Family History  Problem Relation Age of Onset  . Diabetes Mother   . Heart attack Son   . Hypertension Daughter     Social History Social History  Substance Use Topics  . Smoking status: Former Smoker -- 2.00 packs/day for 46 years  . Smokeless tobacco: Never Used  . Alcohol Use: No    Review of Systems Constitutional: No fever/chills Eyes: No visual changes. ENT: No sore throat. Cardiovascular: Denies chest pain. Respiratory:  shortness of breath. Gastrointestinal: No abdominal pain.  No nausea, no vomiting.  No diarrhea.  No constipation. Genitourinary: Negative for dysuria. Musculoskeletal: Negative for back pain. Skin: Negative for rash. Neurological: Negative for headaches, focal weakness or numbness.  10-point ROS otherwise  negative.  ____________________________________________   PHYSICAL EXAM:  VITAL SIGNS: ED Triage Vitals  Enc Vitals Group     BP --      Pulse --      Resp --      Temp --      Temp src --      SpO2 --      Weight --      Height --      Head Cir --      Peak Flow --      Pain Score --      Pain Loc --      Pain Edu? --      Excl. in [place]? --     Constitutional: Alert and oriented. Working hard to breathe. Eyes: Conjunctivae are normal. PERRL. EOMI. Head: Atraumatic. Nose: No congestion/rhinnorhea. Mouth/Throat: Mucous membranes are moist.  Oropharynx non-erythematous. Neck: No stridor.  Cardiovascular: Normal rate, regular rhythm. Grossly normal heart sounds.  Good peripheral circulation. Respiratory: Increased respiratory effort.   retractions. Lungs diffuse wheezes and crackles Gastrointestinal: Soft and nontender. No distention. No abdominal bruits. No CVA tenderness. Musculoskeletal: No lower extremity tenderness she does have some edema edema.  No joint effusions. Neurologic:  Normal speech and language. No gross focal neurologic deficits are appreciated. No gait instability. Skin:  Skin is warm, dry and intact. No rash noted. Psychiatric: Mood and affect are normal. Speech and behavior are normal.  ____________________________________________   LABS (all labs ordered are listed, but only abnormal results are displayed)  Labs Reviewed  COMPREHENSIVE METABOLIC PANEL - Abnormal; Notable for the following:    Chloride 100 (*)    Glucose, Bld 128 (*)    BUN 31 (*)    Calcium 8.5 (*)    Albumin 3.0 (*)    GFR calc non Af Amer 52 (*)    All other components within normal limits  TROPONIN I - Abnormal; Notable for the following:    Troponin I 0.05 (*)    All other components within normal limits  BRAIN NATRIURETIC PEPTIDE - Abnormal; Notable for the following:    B Natriuretic Peptide 363.0 (*)    All other components within normal limits  CBC WITH  DIFFERENTIAL/PLATELET - Abnormal; Notable for the following:    WBC 12.0 (*)    RBC 2.56 (*)    Hemoglobin 8.9 (*)    HCT  27.3 (*)    MCV 106.7 (*)    MCH 34.6 (*)    RDW 19.9 (*)    Platelets 124 (*)    Neutro Abs 10.2 (*)    Lymphs Abs 0.6 (*)    Monocytes Absolute 1.1 (*)    All other components within normal limits  TROPONIN I - Abnormal; Notable for the following:    Troponin I 0.05 (*)    All other components within normal limits   ____________________________________________  EKG  EKG read and interpreted by me shows normal sinus rhythm left axis nonspecific ST-T wave changes occasional PVCs ____________________________________________  RADIOLOGY  Radiology reads chest x-ray a small right pleural effusion and atelectasis or infiltrate. ____________________________________________   PROCEDURES  Patient does not have a fever white count is only elevated but has been at that level. ____________________________________________   INITIAL IMPRESSION / ASSESSMENT AND PLAN / ED COURSE  Pertinent labs & imaging results that were available during my care of the patient were reviewed by me and considered in my medical decision making (see chart for details).  At 9:30 patient stated much better lungs are much more clear will give her more Plan on discharging her. She wants to go home. ____________________________________________   FINAL CLINICAL IMPRESSION(S) / ED DIAGNOSES  Final diagnoses:  Chronic obstructive pulmonary disease with acute exacerbation ([place])   Discussed in detail with the patient she will return if she is worse. Includes increased shortness of breath weakness fever bad cough etc.   NEW MEDICATIONS STARTED DURING THIS VISIT:  New Prescriptions   No medications on file     Note:  This document was prepared using Dragon voice recognition software and may include unintentional dictation errors.    Nena Polio, MD 05/15/16 2134  Nena Polio, MD 05/15/16 2135

## 2016-05-15 NOTE — ED Notes (Signed)
Pt came to ED from home via EMS from home. Pt reports increasing sob. History of COPD. Always on 2 L [place]. Given 1 duoneb by EMS.

## 2016-05-22 ENCOUNTER — Other Ambulatory Visit: Payer: Self-pay | Admitting: *Deleted

## 2016-05-22 DIAGNOSIS — J441 Chronic obstructive pulmonary disease with (acute) exacerbation: Secondary | ICD-10-CM

## 2016-05-22 NOTE — Patient Outreach (Signed)
Sara Mccoy) Care Mccoy  05/22/2016  Sara Mccoy Sara Mccoy May 17, 1926 [id number]  Subjective: Telephone call to patient's home number, spoke with patient, and HIPAA verified.  Patient states she is doing well, just a little short of breath after walking from bathroom without oxygen.   Patient states she put on her oxygen, whilel talking to Sara Mccoy and she now feels much better.   Discussed Sara Mccoy Care [facility] services and patient in agreement to receive services.   Patient gave Sara Mccoy verbal authorization to speak with daughter Sara Mccoy regarding her healthcare needs as needed.  Patient stats her daughter lives with her and takes care of all of her needs.   Patient states she was discharged from Sara Mccoy recently around the 21st of May.     Patient states she ambulated with a walker.    States she has the following durable Sara equipment: rollator, nebulizer, oxygen, bedside commode, tub bench, rolling walker, and quad cane.    Patient states she has a living will that was just revised and should be on file at Sara Mccoy.   Patient states she has COPD, back problem, and history of right lung cancer.   States she has been told that she is losing blood somewhere but MD is not sure of the source.  States MD is monitoring  the blood loss and she has had 5 units of blood since February of 2017.  States she always has a perfect blood pressure and has never had a temperature. Patient states she has had a lot of dental work in the past, most of top row teeth have been removed, and this caused her to lose approximately 80 pounds because she could not eat.   States her appetite is much better, eats very healthy, and drinks a lot of boost.  Patient in agreement to [place]  referral for transition of care due to recent hospitalization, COPD disease monitoring, and education.  Patient states she does not have any pharmacy or transportation needs at this time.   Patient will  continue to receive Sara Mccoy services.   Objective: Per chart review: Patient hospitalized 04/25/16 - 04/26/16 for gastrointestinal bleed, Abdominal mass, Absolute anemia  Blood in stool, and Diverticulosis of large intestine without diverticulitis.   Patient has ED visit on 05/15/16 for COPD.    Assessment:  Received Sara Mccoy Care [facility] referral on 05/15/16.   Referral source: Jettie Booze.   Referral reason: Disease and symptom Mccoy.  [facility] diagnosis anemia, gastrointestinal bleed, COPD, home with oxygen at 2 liters.  Past Sara history of: cancer and COPD.    Services requested: Sara Mccoy.  Telephone screen completed.   Patient will receive Sara Mccoy services.  No Telephonic RNCM needs at this time.  Plan: RNCM will send [place] referral for transition of care due to recent hospitalization, COPD disease monitoring, and education.    Sara Mccoy, BSN, [place] Mccoy [facility] Telephonic CM Phone: [phone] Fax: [phone]

## 2016-06-02 ENCOUNTER — Other Ambulatory Visit: Payer: Self-pay | Admitting: *Deleted

## 2016-06-02 NOTE — Patient Outreach (Signed)
Transition of care call successful (f/u on referral from [facility] telephonic RN CM for transition of care, discharged 5/21).  Spoke with pt, HIPPA verified. Discussed with pt referral to f/u with her for transition of care.    Pt reports saw Arthritis MD 6/21, sent her for xray, found she had a compound fx in back, reason for problems breathing.   Pt reports MD thinks coughing did it, could not take a deep breath, was going on for a couple of weeks, doing better now.  Pt reports she lives by herself, son lives down the street, checks on her daily.  Pt reports she does her own medications, taking all as ordered.  Pt reports  Lost 90 lbs, appetite good now,  eats mainly vegetables, has Boost.  As discussed with pt, plan to continue to follow for transition of care, weekly phone calls for 30 days, a home visit to which agreed.   Plan to f/u with pt again 7/3- initial home visit.  Plan to inform Dr. Ginette Pitman of [facility] involvement- fax barrier letter.    Zara Chess.   [place] Care Management  [phone]

## 2016-06-03 ENCOUNTER — Encounter: Payer: Self-pay | Admitting: *Deleted

## 2016-06-08 ENCOUNTER — Other Ambulatory Visit: Payer: Self-pay | Admitting: *Deleted

## 2016-06-08 ENCOUNTER — Encounter: Payer: Self-pay | Admitting: *Deleted

## 2016-06-08 VITALS — BP 100/60 | HR 92 | Resp 28 | Ht 61.0 in | Wt 142.0 lb

## 2016-06-08 DIAGNOSIS — J449 Chronic obstructive pulmonary disease, unspecified: Secondary | ICD-10-CM

## 2016-06-08 NOTE — Patient Outreach (Addendum)
[facility]) Care Management   06/08/2016  Aleesha Ringstad [facility] 1926/06/10 [id number]  Shaakira Borrero Arel is an 82 y.o. female  Subjective: Pt reports Dr. Juleen China (Arthritis MD) referred her to orthopedic MD about compression fracture in back, found on xray, to f/u 8/17.  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  Pt reports fracture was result of her coughing, not coughing Now.  Pt reports breathing is okay, compliant with wearing O2 2LNC day/night.  Pt reports f/u with Dr. Ginette Pitman  For her lungs and primary care MD.  Daughter (present during home visit) reports pt  Could use help with  ADLs, someone to come 2 days a week, requested resources.    Objective:   Filed Vitals:   06/08/16 1041  BP: 100/60  Pulse: 92  Resp: 28    ROS  Physical Exam  Constitutional: She is oriented to person, place, and time. She appears well-developed and well-nourished.  Cardiovascular: Normal rate, regular rhythm and normal heart sounds.   Respiratory: Effort normal and breath sounds normal.  O 2 2 LNC   GI: Soft.  Musculoskeletal: Normal range of motion. She exhibits no edema.  Uses walker   Neurological: She is alert and oriented to person, place, and time.  Skin: Skin is warm and dry.  Bruising noted on bilateral legs.   Psychiatric: She has a normal mood and affect. Her behavior is normal. Judgment and thought content normal.    Encounter Medications:   Outpatient Encounter Prescriptions as of 06/08/2016  Medication Sig Note  . ALPRAZolam (XANAX) 0.5 MG tablet Take 0.5 mg by mouth daily as needed for anxiety. 06/08/2016: Take one at night  . Ascorbic Acid (VITAMIN C) 1000 MG tablet Take 1,000 mg by mouth daily.   . cholecalciferol (VITAMIN D) 1000 units tablet Take 1,000 Units by mouth daily.   . Cyanocobalamin (VITAMIN B-12) 5000 MCG SUBL Place under the tongue. Daily   . Fluticasone-Salmeterol (ADVAIR) 500-50  MCG/DOSE AEPB Inhale 1 puff into the lungs 2 (two) times daily.   Marland Kitchen ipratropium-albuterol (DUONEB) 0.5-2.5 (3) MG/3ML SOLN Take 3 mLs by nebulization every 6 (six) hours as needed. Reported on 06/08/2016   . omeprazole (PRILOSEC) 40 MG capsule Take 40 mg by mouth daily.   Marland Kitchen oxyCODONE-acetaminophen (PERCOCET/ROXICET) 5-325 MG tablet Take by mouth 2 (two) times daily as needed for severe pain. 06/08/2016: Twice  A day- am, pm   . vitamin E 400 UNIT capsule Take 400 Units by mouth daily. 06/08/2016: Pt takes 1000 units daily   . amLODipine (NORVASC) 5 MG tablet Take 5 mg by mouth daily. Reported on 06/08/2016   . b complex vitamins capsule Take 1 capsule by mouth daily. Reported on 06/08/2016   . Calcium Carb-Cholecalciferol (CALCIUM + D3) 600-200 MG-UNIT TABS Take 1 tablet by mouth 2 (two) times daily. Reported on 06/08/2016 06/08/2016: Pt to have daughter pick up, pt to start taking   . docusate sodium (COLACE) 100 MG capsule Take 1 capsule (100 mg total) by mouth 2 (two) times daily. (Patient not taking: Reported on 06/08/2016)   . Multiple Vitamin (MULTIVITAMIN WITH MINERALS) TABS tablet Take 1 tablet by mouth daily. Reported on 06/08/2016   . venlafaxine (EFFEXOR) 37.5 MG tablet Take 37.5 mg by mouth 2 (two) times daily. Reported on 06/08/2016    No facility-administered encounter medications on file as of 06/08/2016.    Functional Status:   In your present state of health, do  you have any difficulty performing the following activities: 06/08/2016 04/25/2016  Hearing? Y N  Vision? N N  Difficulty concentrating or making decisions? N N  Walking or climbing stairs? Y Y  Dressing or bathing? N N  Doing errands, shopping? Tempie Donning  Preparing Food and eating ? N -  Using the Toilet? N -  Do you have problems with loss of bowel control? N -  Managing your Medications? Y -  Managing your Finances? Y -  Housekeeping or managing your Housekeeping? Y -    Fall/Depression Screening:    PHQ 2/9 Scores 06/08/2016  PHQ - 2  Score 1    Assessment:  Pleasant 82 year old woman, lives alone, daughter (present during home visit)lives close  By, supportive, assists as needed(transportation, MD appointments). Pt manages her own medications.  Lungs clear, c/o back pain (+ 9 on pain scale),as reported xray revealed compression fracture, told not  A surgical candidate.                         Fall risk-  Compression fx in back, hx of Osteoporosis.   Pt compliant to use walker at all times,                           Has Life line, daughter lives close by.   As requested, informed pt what is a compression                             Fracture, can be result of osteoporosis, need to take Calcium as directed.                           COPD- stable, O2 sat at rest 97% on 2 LNC.  Pt reports sob when overexert.                          Assistance with ADLs-  Resources needed for personal care aide, RN CM to do social                           Work referral for resources.   Plan:  Pt to continue to take pain medication as needed for back pain.             Osteoporosis- pt to use walker at all times, as discussed- pt to start taking Calcium as directed                 On discharge summary, daughter to pick up.             COPD- pt to continue to be compliant with oxygen use, respiratory medications.            Plan to do a social work referral- resources needed for personal care aide.              Plan to continue to follow pt for transition of care, f/u again telephonically 7/10.               Plan to fax Dr. Ginette Pitman  7/3 home visit encounter.                [facility] CM Care Plan Problem One        Most Recent Value  Care Plan Problem One  Risk for readmission related to recent hospitalization COPD, recent ED visit    Role Documenting the Problem One  Care Management [place] for Problem One  Active   [facility] Long Term Goal (31-90 days)  Pt would not readmit 31 days from day of discharge    [facility] Term Goal Start Date   06/02/16   Interventions for Problem One Long Term Goal  Home visit done    [facility] CM Short Term Goal #1 (0-30 days)  Pt's compound fx - resulting in problems breathing would improve in the next 30 days    [facility] CM Short Term Goal #1 Start Date  06/02/16   Interventions for Short Term Goal #1  Reviewed use of pain med as needed.     [facility] CM Care Plan Problem Two        Most Recent Value   Care Plan Problem Two  better management of COPD   Role Documenting the Problem Two  Care Management Coordinator   Care Plan for Problem Two  Active   [facility] CM Short Term Goal #1 (0-30 days)  Pt would know what to do to better manage COPD, know when to get help in the next 30 days    [facility] CM Short Term Goal #1 Start Date  06/08/16   Interventions for Short Term Goal #2   Provided Emmi information cOPD- what you can do, COPD- when to get help, reviewed with pt.      Zara Chess.   [place] Care Management  [phone]

## 2016-06-09 ENCOUNTER — Encounter: Payer: Self-pay | Admitting: *Deleted

## 2016-06-12 ENCOUNTER — Other Ambulatory Visit: Payer: Self-pay | Admitting: *Deleted

## 2016-06-12 NOTE — Patient Outreach (Signed)
[place] [facility]) Care Management  06/12/2016  Amzie Sillas [facility] 07/20/1926 [id number]   Phone call to patient's daughter to provide her with community resources for in home personal care.  Voicemail message left for a return call.   [name] [facility] Care Management [phone]

## 2016-06-15 ENCOUNTER — Other Ambulatory Visit: Payer: Self-pay | Admitting: *Deleted

## 2016-06-15 NOTE — Patient Outreach (Signed)
Transition of care call successful (ongoing f/u on referral from [facility] telephonic RN CM for transition of care- most recent hospitalization 5/20-5/21 Anemia, gastritis, COPD and recent ED visit 6/9- sob).  Spoke with pt, HIPAA verified.  Pt reports breathing okay, washed some clothes  this am,some sob - had oxygen  off because afraid going to trip over oxygen tubing.  Pt reports sits down to rest while waiting for clothes, daughter to come over today, will let her take clothes out of the dryer. {Pt reports avoids going outdoors during the heat of the day.    Pt reports on back pain- taking the Percocet twice a day which helps but does not last long so has been taking Aleve once a day in between which helps.   Pt reports has an appointment with Dr. Arvella Nigh (orthopedic MD) 7/17.  Pt reports daughter did get her Calcium supplement last week, the day following RN CM's home visit,  taking twice a day.   As discussed with pt plan to f/u again telephonically 7/17 as part of ongoing transition of care.    Zara Chess.   [place] Care Management  [phone]

## 2016-06-17 ENCOUNTER — Other Ambulatory Visit: Payer: Self-pay | Admitting: *Deleted

## 2016-06-17 NOTE — Patient Outreach (Signed)
[place] [facility]) Care Management  06/17/2016  Lametria Klunk [facility] [birth year] [id number]   Return call to patient's daughter to discuss in home care needs.  HIPPA complaint voicemail message left requesting a return call.    [name] [facility] Care Management [phone]

## 2016-06-22 ENCOUNTER — Other Ambulatory Visit: Payer: Self-pay | Admitting: Orthopedic Surgery

## 2016-06-22 ENCOUNTER — Other Ambulatory Visit: Payer: Self-pay | Admitting: *Deleted

## 2016-06-22 DIAGNOSIS — S22000A Wedge compression fracture of unspecified thoracic vertebra, initial encounter for closed fracture: Secondary | ICD-10-CM

## 2016-06-22 NOTE — Patient Outreach (Signed)
Transition of care call successful (week 3, recent in patient stay 5/20-5/21 Anemia, gastritis, COPD, ED visit 6/9- sob).  Spoke with pt, HIPAA verified.   Pt reports saw Dr. Arvella Nigh (Orthopedic MD's assistant) today about her compression fracture, he thinks could have another place (knot in back), scheduled to have an MRI done 7/28.   Pt reports Dr. Ginette Pitman did not think she was a candidate for surgery (cement placed in back) because of her age but Dr. Arvella Nigh felt she was, plans to f/u with Dr. Ginette Pitman and her insurance.   Pt reports Dr. Arvella Nigh PA would not be doing the surgery but Dr. Sherlean Foot.  Pt reports wearing her oxygen at all times but then said did not wear to MD appointment today, when got home was sob.  Pt reports oxygen company is coming out this week, going to ask about getting portable tanks.  Pt reports pain in back still real sharp, taking Percocet twice a day, ibuprofen during the day which helps.   As discussed with pt plan to do  final transition of care call 7/28.    Zara Chess.   [place] Care Management  [phone]

## 2016-06-23 ENCOUNTER — Other Ambulatory Visit: Payer: Self-pay | Admitting: *Deleted

## 2016-06-23 NOTE — Patient Outreach (Signed)
[facility]) Care Management  06/23/2016  [place] [birth year] [id number]   Phone call to patient's daughter to discuss options for in home assistance.  Voicemail message left for a return call.   [name] [facility] Care Management [phone]

## 2016-07-01 ENCOUNTER — Other Ambulatory Visit: Payer: Self-pay | Admitting: *Deleted

## 2016-07-01 NOTE — Patient Outreach (Signed)
[facility]) Care Management  07/01/2016  Melady Chow [facility] 1926/04/10 [id number]   Phone call to patient to discuss need for an in home aid.  Per patient, she would like me to speak to her daughter Tonny Bollman.  Per patient, her daughter had been out of town and was not able to return this social worker's phone call.  Home number for daughter provided [phone].   Plan:  This social worker will contact patient's daughter by the end of the week.    [name] [facility] Care Management [phone]

## 2016-07-02 ENCOUNTER — Other Ambulatory Visit: Payer: Self-pay | Admitting: *Deleted

## 2016-07-02 NOTE — Patient Outreach (Signed)
[facility]) Care Management  07/02/2016  Khia Dieterich [facility] 1926-10-15 [id number]   Phone call from patient's daughter Tonny Bollman who states that patient is doing much better and is no longer needing home health services.  This Education officer, museum discussed the possibility of needing a personal care aid.  Per patient's daughter patient is able to complete her ADL's.  Patient's daughter also reports that she assists with patient's care as well and does not think that patient needs a in home aid.    Patient's daughter verbalized no further social work needs.  Patient's daughter encouraged to contact this social worker if any needs arise in the future.   Patient's case to be closed to social work at this time.    [name] [facility] Care Management [phone]

## 2016-07-03 ENCOUNTER — Other Ambulatory Visit: Payer: Self-pay | Admitting: *Deleted

## 2016-07-03 ENCOUNTER — Ambulatory Visit: Payer: PPO

## 2016-07-05 ENCOUNTER — Encounter: Payer: Self-pay | Admitting: *Deleted

## 2016-07-05 NOTE — Patient Outreach (Addendum)
Final transition of care call successful (most recent hospitalization 5/20-5/21 for Anemia, gastritis,COPD.  ED visit 6/9-sob.  Spoke with pt, HIPAA verified.  Pt reports she cancelled the MRI scheduled for today(compression fracture)- back pain is so much better, can lean back on her recliner as well as lie on back now.  Pt reports she feels compression fracture is getting better on it's own.    Pt reports when she walks, her pain is +1.   Pt reports she ist to call  Dr. Ginette Pitman 7/31, get a referral for GI MD.  Pt reports she has a big knot coming out of her rectum, it  was assessed when she was in the hospital in June, told had no signs of cancer.  Pt reports it is much better now.  Pt reports her Rheumatologist is going to set it up for her to get an IV medication to build her bones.  Pt reports oxygen supplier came last week and she asked about getting portable tanks, was told will check to see if her insurance will pay for them.   Pt reports she is going to have her daughter check on it next week.    RN CM discussed with pt today is final transition of care call, no further case management needs seen, plan to close her case to which pt agreed.    Plan to fax Dr. Ginette Pitman case closure letter.  Plan to inform [facility] care management assistant to close case.   Zara Chess.   [facility]  [phone]

## 2016-07-10 ENCOUNTER — Other Ambulatory Visit: Payer: Self-pay | Admitting: Gastroenterology

## 2016-07-10 DIAGNOSIS — N83202 Unspecified ovarian cyst, left side: Secondary | ICD-10-CM

## 2016-07-14 ENCOUNTER — Ambulatory Visit
Admission: RE | Admit: 2016-07-14 | Discharge: 2016-07-14 | Disposition: A | Discharge status: Home / Self Care | Payer: PPO | Source: Ambulatory Visit | Attending: Gastroenterology | Admitting: Gastroenterology

## 2016-07-14 ENCOUNTER — Other Ambulatory Visit
Admission: RE | Admit: 2016-07-14 | Discharge: 2016-07-14 | Disposition: A | Discharge status: Home / Self Care | Payer: PPO | Source: Ambulatory Visit | Attending: Gastroenterology | Admitting: Gastroenterology

## 2016-07-14 DIAGNOSIS — N83202 Unspecified ovarian cyst, left side: Secondary | ICD-10-CM | POA: Diagnosis not present

## 2016-07-14 DIAGNOSIS — R197 Diarrhea, unspecified: Secondary | ICD-10-CM | POA: Insufficient documentation

## 2016-07-14 LAB — C DIFFICILE QUICK SCREEN W PCR REFLEX
C DIFFICILE (CDIFF) TOXIN: NEGATIVE
C DIFFICLE (CDIFF) ANTIGEN: NEGATIVE
C Diff interpretation: NOT DETECTED

## 2016-07-15 LAB — GASTROINTESTINAL PANEL BY PCR, STOOL (REPLACES STOOL CULTURE)
ADENOVIRUS F40/41: NOT DETECTED
Astrovirus: NOT DETECTED
CAMPYLOBACTER SPECIES: NOT DETECTED
CRYPTOSPORIDIUM: NOT DETECTED
CYCLOSPORA CAYETANENSIS: NOT DETECTED
E. coli O157: NOT DETECTED
ENTAMOEBA HISTOLYTICA: NOT DETECTED
ENTEROPATHOGENIC E COLI (EPEC): NOT DETECTED
Enteroaggregative E coli (EAEC): NOT DETECTED
Enterotoxigenic E coli (ETEC): NOT DETECTED
GIARDIA LAMBLIA: NOT DETECTED
Norovirus GI/GII: DETECTED — AB
Plesimonas shigelloides: NOT DETECTED
ROTAVIRUS A: NOT DETECTED
SHIGA LIKE TOXIN PRODUCING E COLI (STEC): NOT DETECTED
Salmonella species: NOT DETECTED
Sapovirus (I, II, IV, and V): NOT DETECTED
Shigella/Enteroinvasive E coli (EIEC): NOT DETECTED
VIBRIO CHOLERAE: NOT DETECTED
VIBRIO SPECIES: NOT DETECTED
YERSINIA ENTEROCOLITICA: NOT DETECTED

## 2016-07-21 ENCOUNTER — Encounter: Payer: Self-pay | Admitting: Emergency Medicine

## 2016-07-21 ENCOUNTER — Inpatient Hospital Stay
Admission: EM | Admit: 2016-07-21 | Discharge: 2016-07-27 | DRG: 189 | Disposition: A | Discharge status: Skilled Nursing Facility | Payer: PPO | Attending: Internal Medicine | Admitting: Internal Medicine

## 2016-07-21 DIAGNOSIS — K921 Melena: Secondary | ICD-10-CM | POA: Diagnosis present

## 2016-07-21 DIAGNOSIS — D649 Anemia, unspecified: Secondary | ICD-10-CM | POA: Diagnosis present

## 2016-07-21 DIAGNOSIS — R0602 Shortness of breath: Secondary | ICD-10-CM

## 2016-07-21 DIAGNOSIS — Z8249 Family history of ischemic heart disease and other diseases of the circulatory system: Secondary | ICD-10-CM

## 2016-07-21 DIAGNOSIS — Z66 Do not resuscitate: Secondary | ICD-10-CM | POA: Diagnosis present

## 2016-07-21 DIAGNOSIS — Z833 Family history of diabetes mellitus: Secondary | ICD-10-CM

## 2016-07-21 DIAGNOSIS — J9690 Respiratory failure, unspecified, unspecified whether with hypoxia or hypercapnia: Secondary | ICD-10-CM

## 2016-07-21 DIAGNOSIS — Z9981 Dependence on supplemental oxygen: Secondary | ICD-10-CM

## 2016-07-21 DIAGNOSIS — R531 Weakness: Secondary | ICD-10-CM

## 2016-07-21 DIAGNOSIS — K623 Rectal prolapse: Secondary | ICD-10-CM

## 2016-07-21 DIAGNOSIS — J441 Chronic obstructive pulmonary disease with (acute) exacerbation: Secondary | ICD-10-CM | POA: Diagnosis present

## 2016-07-21 DIAGNOSIS — J9600 Acute respiratory failure, unspecified whether with hypoxia or hypercapnia: Secondary | ICD-10-CM | POA: Diagnosis present

## 2016-07-21 DIAGNOSIS — D500 Iron deficiency anemia secondary to blood loss (chronic): Secondary | ICD-10-CM | POA: Diagnosis present

## 2016-07-21 DIAGNOSIS — J9621 Acute and chronic respiratory failure with hypoxia: Secondary | ICD-10-CM | POA: Diagnosis not present

## 2016-07-21 DIAGNOSIS — Z87891 Personal history of nicotine dependence: Secondary | ICD-10-CM

## 2016-07-21 DIAGNOSIS — E8770 Fluid overload, unspecified: Secondary | ICD-10-CM | POA: Diagnosis present

## 2016-07-21 DIAGNOSIS — I7300 Raynaud's syndrome without gangrene: Secondary | ICD-10-CM | POA: Diagnosis present

## 2016-07-21 DIAGNOSIS — J9622 Acute and chronic respiratory failure with hypercapnia: Secondary | ICD-10-CM | POA: Diagnosis present

## 2016-07-21 DIAGNOSIS — I480 Paroxysmal atrial fibrillation: Secondary | ICD-10-CM | POA: Diagnosis not present

## 2016-07-21 DIAGNOSIS — Z85118 Personal history of other malignant neoplasm of bronchus and lung: Secondary | ICD-10-CM

## 2016-07-21 HISTORY — DX: Enteroptosis: K63.4

## 2016-07-21 LAB — BASIC METABOLIC PANEL
ANION GAP: 6 (ref 5–15)
BUN: 24 mg/dL — ABNORMAL HIGH (ref 6–20)
CHLORIDE: 96 mmol/L — AB (ref 101–111)
CO2: 35 mmol/L — ABNORMAL HIGH (ref 22–32)
Calcium: 9.2 mg/dL (ref 8.9–10.3)
Creatinine, Ser: 0.71 mg/dL (ref 0.44–1.00)
GFR calc non Af Amer: >60 mL/min (ref >60)
Glucose, Bld: 102 mg/dL — ABNORMAL HIGH (ref 65–99)
POTASSIUM: 4.2 mmol/L (ref 3.5–5.1)
SODIUM: 137 mmol/L (ref 135–145)

## 2016-07-21 LAB — CBC
HEMATOCRIT: 18.8 % — AB (ref 35.0–47.0)
HEMOGLOBIN: 6.2 g/dL — AB (ref 12.0–16.0)
MCH: 36.6 pg — ABNORMAL HIGH (ref 26.0–34.0)
MCHC: 32.9 g/dL (ref 32.0–36.0)
MCV: 111.4 fL — AB (ref 80.0–100.0)
Platelets: 109 10*3/uL — ABNORMAL LOW (ref 150–440)
RBC: 1.69 MIL/uL — AB (ref 3.80–5.20)
RDW: 18.9 % — ABNORMAL HIGH (ref 11.5–14.5)
WBC: 8 10*3/uL (ref 3.6–11.0)

## 2016-07-21 LAB — PREPARE RBC (CROSSMATCH)

## 2016-07-21 LAB — TROPONIN I: Troponin I: 0.03 ng/mL (ref <0.03)

## 2016-07-21 MED ORDER — VITAMIN D 1000 UNITS PO TABS
1000.0000 [IU] | ORAL_TABLET | Freq: Every day | ORAL | Status: DC
  Filled 2016-07-21: qty 1

## 2016-07-21 MED ORDER — SODIUM CHLORIDE 0.9 % IV SOLN
Freq: Once | INTRAVENOUS | Status: AC
  Administered 2016-07-22: 01:00:00 via INTRAVENOUS

## 2016-07-21 MED ORDER — IPRATROPIUM-ALBUTEROL 0.5-2.5 (3) MG/3ML IN SOLN: 3.0000 mL | Freq: Four times a day (QID) | RESPIRATORY_TRACT | Status: DC | PRN

## 2016-07-21 MED ORDER — B COMPLEX-C PO TABS
1.0000 | ORAL_TABLET | Freq: Every day | ORAL | Status: DC
  Filled 2016-07-21: qty 1

## 2016-07-21 MED ORDER — VENLAFAXINE HCL 37.5 MG PO TABS
37.5000 mg | ORAL_TABLET | Freq: Two times a day (BID) | ORAL | Status: DC
Start: 2016-07-21 — End: 2016-07-22
  Administered 2016-07-21: 37.5 mg via ORAL
  Filled 2016-07-21 (×2): qty 1

## 2016-07-21 MED ORDER — ONDANSETRON HCL 4 MG/2ML IJ SOLN
4.0000 mg | Freq: Four times a day (QID) | INTRAMUSCULAR | Status: DC | PRN
  Administered 2016-07-22: 4 mg via INTRAVENOUS
  Filled 2016-07-21: qty 2

## 2016-07-21 MED ORDER — VITAMIN B-12 1000 MCG PO TABS
5000.0000 ug | ORAL_TABLET | Freq: Every day | ORAL | Status: DC
  Filled 2016-07-21: qty 5

## 2016-07-21 MED ORDER — PANTOPRAZOLE SODIUM 40 MG PO TBEC
40.0000 mg | DELAYED_RELEASE_TABLET | Freq: Every day | ORAL | Status: DC
  Filled 2016-07-21: qty 1

## 2016-07-21 MED ORDER — ENOXAPARIN SODIUM 40 MG/0.4ML ~~LOC~~ SOLN
40.0000 mg | SUBCUTANEOUS | Status: DC
  Administered 2016-07-21: 40 mg via SUBCUTANEOUS
  Filled 2016-07-21: qty 0.4

## 2016-07-21 MED ORDER — CALCIUM CARBONATE-VITAMIN D 500-200 MG-UNIT PO TABS
1.0000 | ORAL_TABLET | Freq: Two times a day (BID) | ORAL | Status: DC
  Administered 2016-07-22: 1 via ORAL
  Filled 2016-07-21 (×2): qty 1

## 2016-07-21 MED ORDER — VITAMIN E 45 MG (100 UNIT) PO CAPS
1000.0000 [IU] | ORAL_CAPSULE | Freq: Every day | ORAL | Status: DC
  Filled 2016-07-21 (×2): qty 2

## 2016-07-21 MED ORDER — ONDANSETRON HCL 4 MG PO TABS: 4.0000 mg | ORAL_TABLET | Freq: Four times a day (QID) | ORAL | Status: DC | PRN

## 2016-07-21 MED ORDER — VITAMIN C 500 MG PO TABS
1000.0000 mg | ORAL_TABLET | Freq: Every day | ORAL | Status: DC
  Filled 2016-07-21: qty 2

## 2016-07-21 MED ORDER — ACETAMINOPHEN 325 MG PO TABS: 650.0000 mg | ORAL_TABLET | Freq: Four times a day (QID) | ORAL | Status: DC | PRN

## 2016-07-21 MED ORDER — OXYCODONE-ACETAMINOPHEN 5-325 MG PO TABS
1.0000 | ORAL_TABLET | Freq: Three times a day (TID) | ORAL | Status: DC | PRN
  Administered 2016-07-21: 1 via ORAL
  Filled 2016-07-21: qty 1

## 2016-07-21 MED ORDER — ADULT MULTIVITAMIN W/MINERALS CH
1.0000 | ORAL_TABLET | Freq: Every day | ORAL | Status: DC
  Filled 2016-07-21: qty 1

## 2016-07-21 MED ORDER — ACETAMINOPHEN 650 MG RE SUPP
650.0000 mg | Freq: Four times a day (QID) | RECTAL | Status: DC | PRN
  Administered 2016-07-22: 650 mg via RECTAL
  Filled 2016-07-21: qty 1

## 2016-07-21 MED ORDER — AMLODIPINE BESYLATE 5 MG PO TABS
5.0000 mg | ORAL_TABLET | Freq: Every day | ORAL | Status: DC
  Filled 2016-07-21: qty 1

## 2016-07-21 MED ORDER — SODIUM CHLORIDE 0.9% FLUSH
3.0000 mL | Freq: Two times a day (BID) | INTRAVENOUS | Status: DC
  Administered 2016-07-21 – 2016-07-26 (×9): 3 mL via INTRAVENOUS

## 2016-07-21 MED ORDER — MOMETASONE FURO-FORMOTEROL FUM 200-5 MCG/ACT IN AERO
2.0000 | INHALATION_SPRAY | Freq: Two times a day (BID) | RESPIRATORY_TRACT | Status: DC
  Administered 2016-07-22 – 2016-07-27 (×11): 2 via RESPIRATORY_TRACT
  Filled 2016-07-21: qty 8.8

## 2016-07-21 MED ORDER — SODIUM CHLORIDE 0.9 % IV SOLN
INTRAVENOUS | Status: DC
  Administered 2016-07-21: via INTRAVENOUS

## 2016-07-21 MED ORDER — ALPRAZOLAM 0.5 MG PO TABS
0.5000 mg | ORAL_TABLET | Freq: Every day | ORAL | Status: DC | PRN
  Administered 2016-07-22: 0.5 mg via ORAL
  Filled 2016-07-21: qty 1

## 2016-07-21 NOTE — H&P (Signed)
[place] @ [facility] Admission History and Physical Sara Mccoy, D.O.  ---------------------------------------------------------------------------------------------------------------------   PATIENT NAME: Sara Mccoy MR#: [id number] DATE OF BIRTH: August 14, 1926 DATE OF ADMISSION: 07/21/2016 PRIMARY CARE PHYSICIAN: Tracie Harrier, MD  REQUESTING/REFERRING PHYSICIAN: ED Dr. Jacqualine Code  CHIEF COMPLAINT: Chief Complaint  Patient presents with  . Abnormal Lab    HISTORY OF PRESENT ILLNESS: Sara Mccoy is a 82 y.o. female with a known history of Home O2 dependent COPD, anemia, diverticulosis, lung cancer and renal disease was in a usual state of health until this afternoon when she received results of preop testing that demonstrated anemia. She is being medically evaluated for a surgical repair of a rectal prolapse which has caused her discomfort and bleeding. Routine labs revealed anemia. She has had similar issues in the past requiring multiple blood transfusions. She does report that she has been increasingly fatigued and slightly more short of breath than usual. She ordinarily uses 2 L of oxygen at home. She denies Chest pain, abdominal pain, lightheadedness or dizziness.  Her only other complaint is rectal discomfort secondary to prolapse.  Otherwise there has been no change in status. Patient has been taking medication as prescribed and there has been no recent change in medication or diet.  There has been no recent illness, travel or sick contacts.    Patient denies fevers/chills, weakness, dizziness, chest pain, shortness of breath, N/V/C/D, abdominal pain, dysuria/frequency, changes in mental status.     PAST MEDICAL HISTORY: Past Medical History:  Diagnosis Date  . Anemia   . COPD (chronic obstructive pulmonary disease) ([place])   . Lung cancer, upper lobe ([place]) 1997   right upper lobe  . Prolapse of intestine   . Raynaud's disease       PAST  SURGICAL HISTORY: Past Surgical History:  Procedure Laterality Date  . ABDOMINAL HYSTERECTOMY    . CESAREAN SECTION    . CHOLECYSTECTOMY    . FLEXIBLE SIGMOIDOSCOPY N/A 04/25/2016   Procedure: FLEXIBLE SIGMOIDOSCOPY;  Surgeon: Lucilla Lame, MD;  Location: [facility] ENDOSCOPY;  Service: Endoscopy;  Laterality: N/A;  . JOINT REPLACEMENT    . LUNG SURGERY Right 1997   RUL removed      SOCIAL HISTORY: Social History  Substance Use Topics  . Smoking status: Former Smoker    Packs/day: 2.00    Years: 46.00  . Smokeless tobacco: Never Used  . Alcohol use No      FAMILY HISTORY: Family History  Problem Relation Age of Onset  . Heart attack Son   . Diabetes Son   . Diabetes Mother   . Hypertension Daughter      MEDICATIONS AT HOME: Prior to Admission medications   Medication Sig Start Date End Date Taking? Authorizing Provider  Calcium Carb-Cholecalciferol (CALCIUM + D3) 600-200 MG-UNIT TABS Take 1 tablet by mouth 2 (two) times daily. Reported on 06/08/2016   Yes Historical Provider, MD  Multiple Vitamin (MULTIVITAMIN WITH MINERALS) TABS tablet Take 1 tablet by mouth daily. Reported on 06/08/2016   Yes Historical Provider, MD  oxyCODONE-acetaminophen (PERCOCET/ROXICET) 5-325 MG tablet Take by mouth 2 (two) times daily as needed for severe pain.   Yes Historical Provider, MD  ALPRAZolam Duanne Moron) 0.5 MG tablet Take 0.5 mg by mouth daily as needed for anxiety.    Historical Provider, MD  amLODipine (NORVASC) 5 MG tablet Take 5 mg by mouth daily. Reported on 06/08/2016    Historical Provider, MD  Ascorbic Acid (VITAMIN C) 1000 MG tablet Take 1,000 mg by mouth  daily.    Historical Provider, MD  b complex vitamins capsule Take 1 capsule by mouth daily. Reported on 06/08/2016    Historical Provider, MD  cholecalciferol (VITAMIN D) 1000 units tablet Take 1,000 Units by mouth daily.    Historical Provider, MD  Cyanocobalamin (VITAMIN B-12) 5000 MCG SUBL Place under the tongue. Daily    Historical  Provider, MD  docusate sodium (COLACE) 100 MG capsule Take 1 capsule (100 mg total) by mouth 2 (two) times daily. Patient not taking: Reported on 06/08/2016 04/26/16   Gladstone Lighter, MD  Fluticasone-Salmeterol (ADVAIR) 500-50 MCG/DOSE AEPB Inhale 1 puff into the lungs 2 (two) times daily.    Historical Provider, MD  ipratropium-albuterol (DUONEB) 0.5-2.5 (3) MG/3ML SOLN Take 3 mLs by nebulization every 6 (six) hours as needed. Reported on 06/08/2016    Historical Provider, MD  omeprazole (PRILOSEC) 40 MG capsule Take 40 mg by mouth daily.    Historical Provider, MD  venlafaxine (EFFEXOR) 37.5 MG tablet Take 37.5 mg by mouth 2 (two) times daily. Reported on 06/08/2016    Historical Provider, MD  vitamin E 400 UNIT capsule Take 400 Units by mouth daily.    Historical Provider, MD      DRUG ALLERGIES: No Known Allergies   REVIEW OF SYSTEMS: CONSTITUTIONAL: No fever/chills, Positive fatigue, weakness, weight gain/loss, headache EYES: No blurry or double vision. ENT: No tinnitus, postnasal drip, redness or soreness of the oropharynx. RESPIRATORY: No cough, wheeze, hemoptysis, dyspnea. CARDIOVASCULAR: No chest pain, orthopnea, palpitations, syncope. GASTROINTESTINAL: No nausea, vomiting, constipation, diarrhea, abdominal pain, hematemesis, melena or hematochezia. Positive rectal discomfort secondary to prolapse GENITOURINARY: No dysuria or hematuria. ENDOCRINE: No polyuria or nocturia. No heat or cold intolerance. HEMATOLOGY: No anemia, bruising, bleeding. INTEGUMENTARY: No rashes, ulcers, lesions. MUSCULOSKELETAL: No arthritis, swelling, gout. NEUROLOGIC: No numbness, tingling, weakness or ataxia. No seizure-type activity. PSYCHIATRIC: No anxiety, depression, insomnia.  PHYSICAL EXAMINATION: VITAL SIGNS: Blood pressure (!) 161/72, pulse 87, temperature 98.6 F (37 C), temperature source Oral, resp. rate (!) 28, height '5\' 1"'$  (1.549 m), weight 67.7 kg (149 lb 3.2 oz), SpO2 100 %.  GENERAL:  82 y.o.-year-old white female patient, pale but well-developed, well-nourished lying in the bed in no acute distress.  Pleasant and cooperative.   HEENT: Head atraumatic, normocephalic. Pupils equal, round, reactive to light and accommodation. No scleral icterus. Extraocular muscles intact. Nares are patent. Oropharynx is clear. Mucus membranes moist. NECK: Supple, full range of motion. No JVD, no bruit heard. No thyroid enlargement, no tenderness, no cervical lymphadenopathy. CHEST: Normal breath sounds bilaterally. No wheezing, rales, rhonchi or crackles. No use of accessory muscles of respiration.  No reproducible chest wall tenderness. Slightly winded with minimal exertion CARDIOVASCULAR: S1, S2 normal. No murmurs, rubs, or gallops. Cap refill <2 seconds. ABDOMEN: Soft, nontender, nondistended. No rebound, guarding, rigidity. Normoactive bowel sounds present in all four quadrants. No organomegaly or mass. EXTREMITIES: Full range of motion. No pedal edema, cyanosis, or clubbing. NEUROLOGIC: Cranial nerves II through XII are grossly intact with no focal sensorimotor deficit. Muscle strength 5/5 in all extremities. Sensation intact. Gait not checked. PSYCHIATRIC: The patient is alert and oriented x 3. Normal affect, mood, thought content. SKIN: Warm, dry, and intact without obvious rash, lesion, or ulcer.  LABORATORY PANEL:  CBC  Recent Labs Lab 07/21/16 1848  WBC 8.0  HGB 6.2*  HCT 18.8*  PLT 109*   ----------------------------------------------------------------------------------------------------------------- Chemistries  Recent Labs Lab 07/21/16 1848  NA 137  K 4.2  CL 96*  CO2 35*  GLUCOSE 102*  BUN 24*  CREATININE 0.71  CALCIUM 9.2   ------------------------------------------------------------------------------------------------------------------ Cardiac Enzymes  Recent Labs Lab 07/21/16 1848  TROPONINI 0.03*    ------------------------------------------------------------------------------------------------------------------  RADIOLOGY: No results found.  EKG:  Sinus with a right bundle blanch block with no other significant ST or T-wave changes   IMPRESSION AND PLAN:  This is a 82 y.o. female with a history of home O2 dependent COPD, diverticulosis, anemia, rectal prolapse now being admitted with: 1. Symptomatic anemia-we will admit for observation and transfusion of packed red blood cells. We'll start with 2 units and recheck a CBC posttransfusion. I have also ordered a repeat iron studies as well as B12 and folate. 2. Rectal prolapse-currently being medically evaluated for surgical clearance to pursue with a repair of her rectal prolapse. 3. Home O2 dependent COPD-we'll continue O2 via nasal cannula. Otherwise continue home medications  Diet/Nutrition: Heart healthy Fluids: IV normal saline DVT Px: Lovenox, SCDs and early ambulation Code Status: DNR - I personally discussed the patient's wishes with her. She reports that under no circumstances should CPR or intubation be attempted.  She states that both her daughter and her primary care provider have her advanced directives on file and are aware of her wishes.  All the records are reviewed and case discussed with ED provider. Management plans discussed with the patient and/or family who express understanding and agree with plan of care.   TOTAL TIME TAKING CARE OF THIS PATIENT: 60 minutes.   Calani Gick D.O. on 07/21/2016 at 11:30 PM Between 7am to 6pm - Pager - [phone] After 6pm go to [email or web address] - [employer] [facility] [place] [facility] Office [phone] CC: Primary care physician; Tracie Harrier, MD     Note: This dictation was prepared with Dragon dictation along with smaller phrase technology. Any transcriptional errors that result from this process are unintentional.

## 2016-07-21 NOTE — ED Notes (Signed)
0.03Troponin called by lab. Primary nurse notified.

## 2016-07-21 NOTE — ED Notes (Signed)
Pt sent to er for eval of low hemoglobin.  Pt states her rectum is hanging out and when to see surgeon today.  However, hgb was low today. Pt reports dizziness since yesterday.  No chest pain.  Pt is on 2 liters oxygen at home  Hx lung cancer.  Pt alert   speech clear.  Family with pt.

## 2016-07-21 NOTE — ED Triage Notes (Signed)
Pt sent over from [facility] for further eval of  Low HGB of 6.0. Pt c/o weakness.

## 2016-07-21 NOTE — ED Provider Notes (Signed)
[facility] Emergency Department Provider Note   ____________________________________________   First MD Initiated Contact with Patient 07/21/16 1904     (approximate)  I have reviewed the triage vital signs and the nursing notes.   HISTORY  Chief Complaint Abnormal Lab    HPI Sara Mccoy is a 82 y.o. female presents for evaluation after preop testing demonstrated a low blood count. She and her daughter report that she has had low blood counts requiring 5 transfusions in the past due to chronic anemia. She is being evaluated for it is prolapsed rectum which occasionally has slight amount of bleeding, but very tiny small amounts noted in her underwear from time to time. She is had this evaluated by Dr. Iran Planas today, and reports she does not wish for any examination or further evaluation for her rectum in the ER and this seems reasonable.  The patient reports chronic shortness of breath, that she normally uses 2 L of oxygen at home but since she was not expecting to come to the emergency room she was not using that as she often does not when she is out about. She denies any new shortness of breath.  No chest pain. No abdominal pain and she feels a sense of fullness around the rectum which is been ongoing for a long time, and she is planning to have surgery done but needs her blood count improved.   Past Medical History:  Diagnosis Date  . Anemia   . COPD (chronic obstructive pulmonary disease) ([place])   . Lung cancer, upper lobe ([place]) 1997   right upper lobe  . Raynaud's disease     Patient Active Problem List   Diagnosis Date Noted  . GI bleed 04/25/2016  . Abdominal mass   . Absolute anemia   . Blood in stool   . Diverticulosis of large intestine without diverticulitis     Past Surgical History:  Procedure Laterality Date  . ABDOMINAL HYSTERECTOMY    . CESAREAN SECTION    . CHOLECYSTECTOMY    . FLEXIBLE SIGMOIDOSCOPY N/A  04/25/2016   Procedure: FLEXIBLE SIGMOIDOSCOPY;  Surgeon: Lucilla Lame, MD;  Location: [facility] ENDOSCOPY;  Service: Endoscopy;  Laterality: N/A;  . JOINT REPLACEMENT    . LUNG SURGERY Right 1997   RUL removed    Prior to Admission medications   Medication Sig Start Date End Date Taking? Authorizing Provider  ALPRAZolam Duanne Moron) 0.5 MG tablet Take 0.5 mg by mouth daily as needed for anxiety.    Historical Provider, MD  amLODipine (NORVASC) 5 MG tablet Take 5 mg by mouth daily. Reported on 06/08/2016    Historical Provider, MD  Ascorbic Acid (VITAMIN C) 1000 MG tablet Take 1,000 mg by mouth daily.    Historical Provider, MD  b complex vitamins capsule Take 1 capsule by mouth daily. Reported on 06/08/2016    Historical Provider, MD  Calcium Carb-Cholecalciferol (CALCIUM + D3) 600-200 MG-UNIT TABS Take 1 tablet by mouth 2 (two) times daily. Reported on 06/08/2016    Historical Provider, MD  cholecalciferol (VITAMIN D) 1000 units tablet Take 1,000 Units by mouth daily.    Historical Provider, MD  Cyanocobalamin (VITAMIN B-12) 5000 MCG SUBL Place under the tongue. Daily    Historical Provider, MD  docusate sodium (COLACE) 100 MG capsule Take 1 capsule (100 mg total) by mouth 2 (two) times daily. Patient not taking: Reported on 06/08/2016 04/26/16   Gladstone Lighter, MD  Fluticasone-Salmeterol (ADVAIR) 500-50 MCG/DOSE AEPB Inhale 1 puff  into the lungs 2 (two) times daily.    Historical Provider, MD  ipratropium-albuterol (DUONEB) 0.5-2.5 (3) MG/3ML SOLN Take 3 mLs by nebulization every 6 (six) hours as needed. Reported on 06/08/2016    Historical Provider, MD  Multiple Vitamin (MULTIVITAMIN WITH MINERALS) TABS tablet Take 1 tablet by mouth daily. Reported on 06/08/2016    Historical Provider, MD  omeprazole (PRILOSEC) 40 MG capsule Take 40 mg by mouth daily.    Historical Provider, MD  oxyCODONE-acetaminophen (PERCOCET/ROXICET) 5-325 MG tablet Take by mouth 2 (two) times daily as needed for severe pain.    Historical  Provider, MD  venlafaxine (EFFEXOR) 37.5 MG tablet Take 37.5 mg by mouth 2 (two) times daily. Reported on 06/08/2016    Historical Provider, MD  vitamin E 400 UNIT capsule Take 400 Units by mouth daily.    Historical Provider, MD    Allergies Review of patient's allergies indicates no known allergies.  Family History  Problem Relation Age of Onset  . Heart attack Son   . Diabetes Son   . Diabetes Mother   . Hypertension Daughter     Social History Social History  Substance Use Topics  . Smoking status: Former Smoker    Packs/day: 2.00    Years: 46.00  . Smokeless tobacco: Never Used  . Alcohol use No    Review of Systems Constitutional: No fever/chills Eyes: No visual changes. ENT: No sore throat. Cardiovascular: Denies chest pain. Respiratory: Chronic shortness of breath, unchanged. Use oxygen at home, Gastrointestinal: No abdominal pain.  No nausea, no vomiting.  Chronically Luke's watery stool. Genitourinary: Negative for dysuria. Musculoskeletal: Negative for back pain. Skin: Negative for rash. Neurological: Negative for headaches, focal weakness or numbness.  10-point ROS otherwise negative.  ____________________________________________   PHYSICAL EXAM:  VITAL SIGNS: ED Triage Vitals  Enc Vitals Group     BP 07/21/16 1802 (!) 157/63     Pulse Rate 07/21/16 1802 85     Resp 07/21/16 1801 20     Temp 07/21/16 1807 97.8 F (36.6 C)     Temp Source 07/21/16 1801 Oral     SpO2 07/21/16 1807 (!) 82 %     Weight 07/21/16 1802 147 lb (66.7 kg)     Height 07/21/16 1802 '5\' 1"'$  (1.549 m)     Head Circumference --      Peak Flow --      Pain Score 07/21/16 1802 8     Pain Loc --      Pain Edu? --      Excl. in [place]? --     Constitutional: Alert and oriented. Well appearing and in no acute distress.Somewhat pale complexionMore fatigued than normal the last few days. Eyes: Conjunctivae are normal. PERRL. EOMI. pallor of the conjunctiva Head: Atraumatic. Nose: No  congestion/rhinnorhea. Mouth/Throat: Mucous membranes are moist.  Oropharynx non-erythematous. Neck: No stridor.   Cardiovascular: Normal rate, regular rhythm. Grossly normal heart sounds.  Good peripheral circulation. Respiratory: Normal respiratory effort.  No retractions. Lungs CTAB. Normal oxygen saturation when on her normal 2 L. Gastrointestinal: Soft and nontender. No distention. Rectal exam deferred at patient request. Musculoskeletal: No lower extremity tenderness nor edema.   Neurologic:  Normal speech and language. No gross focal neurologic deficits are appreciated.  Skin:  Skin is warm, dry and intact. No rash noted. Psychiatric: Mood and affect are normal. Speech and behavior are normal.  ____________________________________________   LABS (all labs ordered are listed, but only abnormal results are displayed)  Labs Reviewed  BASIC METABOLIC PANEL - Abnormal; Notable for the following:       Result Value   Chloride 96 (*)    CO2 35 (*)    Glucose, Bld 102 (*)    BUN 24 (*)    All other components within normal limits  CBC - Abnormal; Notable for the following:    RBC 1.69 (*)    Hemoglobin 6.2 (*)    HCT 18.8 (*)    MCV 111.4 (*)    MCH 36.6 (*)    RDW 18.9 (*)    Platelets 109 (*)    All other components within normal limits  TROPONIN I - Abnormal; Notable for the following:    Troponin I 0.03 (*)    All other components within normal limits  TROPONIN I  CBC  BASIC METABOLIC PANEL  TYPE AND SCREEN  TYPE AND SCREEN   ____________________________________________  EKG  Reviewed and interpreted by me at 1945 Heart rate 80 QRS 140 QTc 440 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 No evidence of acute ischemic changes noted,   ____________________________________________  RADIOLOGY   ____________________________________________   PROCEDURES  Procedure(s) performed: None  Procedures  Critical Care performed: Yes, see critical care note(s)  CRITICAL CARE Performed by: Delman Kitten  ____________________________________________   INITIAL IMPRESSION / ASSESSMENT AND PLAN / ED COURSE  Pertinent labs & imaging results that were available during my care of the patient were reviewed by me and considered in my medical decision making (see chart for details).    Clinical Course   Patient presents for evaluation of a low blood count on preoperative testing. She has mild dyspnea, but reports this is chronic. Hemodynamically stable, and the clinical history seems suggest insidious blood loss with a history of previous need for transfusions.  Discussed the risks and benefits of emergency blood, and given the patient's insidious onset and relatively asymptomatic status with a history of previous transfusions, and feel that policy driven transfusion of uncrossed matched blood in the emergency room would be unwise, and thus plan to obtain type and screen and admit the patient for transfusion of crossmatched blood. ____________________________________________   FINAL CLINICAL IMPRESSION(S) / ED DIAGNOSES  Final diagnoses:  Anemia, unspecified anemia type  Rectal prolapse  Symptomatic anemia  Weakness      NEW MEDICATIONS STARTED DURING THIS VISIT:  New Prescriptions   No medications on file     Note:  This document was prepared using Dragon voice recognition software and may include unintentional dictation errors.     Delman Kitten, MD 07/21/16 2006

## 2016-07-22 ENCOUNTER — Observation Stay: Payer: PPO

## 2016-07-22 DIAGNOSIS — J441 Chronic obstructive pulmonary disease with (acute) exacerbation: Secondary | ICD-10-CM | POA: Diagnosis present

## 2016-07-22 DIAGNOSIS — I480 Paroxysmal atrial fibrillation: Secondary | ICD-10-CM | POA: Diagnosis not present

## 2016-07-22 DIAGNOSIS — Z9981 Dependence on supplemental oxygen: Secondary | ICD-10-CM | POA: Diagnosis not present

## 2016-07-22 DIAGNOSIS — Z66 Do not resuscitate: Secondary | ICD-10-CM | POA: Diagnosis present

## 2016-07-22 DIAGNOSIS — J9600 Acute respiratory failure, unspecified whether with hypoxia or hypercapnia: Secondary | ICD-10-CM | POA: Diagnosis present

## 2016-07-22 DIAGNOSIS — D500 Iron deficiency anemia secondary to blood loss (chronic): Secondary | ICD-10-CM | POA: Diagnosis present

## 2016-07-22 DIAGNOSIS — Z85118 Personal history of other malignant neoplasm of bronchus and lung: Secondary | ICD-10-CM | POA: Diagnosis not present

## 2016-07-22 DIAGNOSIS — K921 Melena: Secondary | ICD-10-CM | POA: Diagnosis present

## 2016-07-22 DIAGNOSIS — E8770 Fluid overload, unspecified: Secondary | ICD-10-CM | POA: Diagnosis present

## 2016-07-22 DIAGNOSIS — I7300 Raynaud's syndrome without gangrene: Secondary | ICD-10-CM | POA: Diagnosis present

## 2016-07-22 DIAGNOSIS — K623 Rectal prolapse: Secondary | ICD-10-CM | POA: Diagnosis present

## 2016-07-22 DIAGNOSIS — J9621 Acute and chronic respiratory failure with hypoxia: Secondary | ICD-10-CM | POA: Diagnosis present

## 2016-07-22 DIAGNOSIS — Z833 Family history of diabetes mellitus: Secondary | ICD-10-CM | POA: Diagnosis not present

## 2016-07-22 DIAGNOSIS — J9622 Acute and chronic respiratory failure with hypercapnia: Secondary | ICD-10-CM | POA: Diagnosis present

## 2016-07-22 DIAGNOSIS — Z8249 Family history of ischemic heart disease and other diseases of the circulatory system: Secondary | ICD-10-CM | POA: Diagnosis not present

## 2016-07-22 DIAGNOSIS — Z87891 Personal history of nicotine dependence: Secondary | ICD-10-CM | POA: Diagnosis not present

## 2016-07-22 LAB — BLOOD GAS, ARTERIAL
ALLENS TEST (PASS/FAIL): POSITIVE — AB
FIO2: 1
PATIENT TEMPERATURE: 37
PH ART: 7.05 — AB (ref 7.350–7.450)
pCO2 arterial: 159 mmHg (ref 32.0–48.0)
pO2, Arterial: 302 mmHg — ABNORMAL HIGH (ref 83.0–108.0)

## 2016-07-22 LAB — BASIC METABOLIC PANEL
ANION GAP: 7 (ref 5–15)
ANION GAP: 1 — AB (ref 5–15)
BUN: 25 mg/dL — ABNORMAL HIGH (ref 6–20)
BUN: 23 mg/dL — AB (ref 6–20)
CHLORIDE: 93 mmol/L — AB (ref 101–111)
CHLORIDE: 96 mmol/L — AB (ref 101–111)
CO2: 37 mmol/L — AB (ref 22–32)
CO2: 38 mmol/L — AB (ref 22–32)
Calcium: 8.8 mg/dL — ABNORMAL LOW (ref 8.9–10.3)
Calcium: 8.5 mg/dL — ABNORMAL LOW (ref 8.9–10.3)
Creatinine, Ser: 0.84 mg/dL (ref 0.44–1.00)
Creatinine, Ser: 0.74 mg/dL (ref 0.44–1.00)
GFR calc Af Amer: >60 mL/min (ref >60)
GFR calc non Af Amer: >60 mL/min (ref >60)
GLUCOSE: 101 mg/dL — AB (ref 65–99)
Glucose, Bld: 115 mg/dL — ABNORMAL HIGH (ref 65–99)
POTASSIUM: 4.1 mmol/L (ref 3.5–5.1)
POTASSIUM: 4.5 mmol/L (ref 3.5–5.1)
Sodium: 137 mmol/L (ref 135–145)
Sodium: 135 mmol/L (ref 135–145)

## 2016-07-22 LAB — IRON AND TIBC
IRON: 105 ug/dL (ref 28–170)
Saturation Ratios: 45 % — ABNORMAL HIGH (ref 10.4–31.8)
TIBC: 232 ug/dL — AB (ref 250–450)
UIBC: 127 ug/dL

## 2016-07-22 LAB — PROTIME-INR
INR: 1.1
Prothrombin Time: 14.2 seconds (ref 11.4–15.2)

## 2016-07-22 LAB — CBC
HEMATOCRIT: 20.2 % — AB (ref 35.0–47.0)
HEMATOCRIT: 26.4 % — AB (ref 35.0–47.0)
HEMOGLOBIN: 6.6 g/dL — AB (ref 12.0–16.0)
HEMOGLOBIN: 9.1 g/dL — AB (ref 12.0–16.0)
MCH: 36.4 pg — ABNORMAL HIGH (ref 26.0–34.0)
MCH: 34.3 pg — ABNORMAL HIGH (ref 26.0–34.0)
MCHC: 32.8 g/dL (ref 32.0–36.0)
MCHC: 34.2 g/dL (ref 32.0–36.0)
MCV: 110.9 fL — ABNORMAL HIGH (ref 80.0–100.0)
MCV: 100.1 fL — AB (ref 80.0–100.0)
Platelets: 132 10*3/uL — ABNORMAL LOW (ref 150–440)
Platelets: 127 10*3/uL — ABNORMAL LOW (ref 150–440)
RBC: 1.82 MIL/uL — ABNORMAL LOW (ref 3.80–5.20)
RBC: 2.64 MIL/uL — ABNORMAL LOW (ref 3.80–5.20)
RDW: 19.4 % — ABNORMAL HIGH (ref 11.5–14.5)
RDW: 25 % — AB (ref 11.5–14.5)
WBC: 8.1 10*3/uL (ref 3.6–11.0)
WBC: 9.7 10*3/uL (ref 3.6–11.0)

## 2016-07-22 LAB — MAGNESIUM: MAGNESIUM: 1.9 mg/dL (ref 1.7–2.4)

## 2016-07-22 LAB — MRSA PCR SCREENING: MRSA BY PCR: NEGATIVE

## 2016-07-22 LAB — VITAMIN B12: Vitamin B-12: 662 pg/mL (ref 180–914)

## 2016-07-22 LAB — FOLATE: Folate: 14.4 ng/mL (ref >5.9)

## 2016-07-22 LAB — FERRITIN: FERRITIN: 709 ng/mL — AB (ref 11–307)

## 2016-07-22 LAB — PHOSPHORUS: PHOSPHORUS: 3.4 mg/dL (ref 2.5–4.6)

## 2016-07-22 LAB — OCCULT BLOOD X 1 CARD TO LAB, STOOL: Fecal Occult Bld: NEGATIVE

## 2016-07-22 LAB — TROPONIN I: TROPONIN I: 0.04 ng/mL — AB (ref <0.03)

## 2016-07-22 LAB — TRANSFERRIN: Transferrin: 163 mg/dL — ABNORMAL LOW (ref 192–382)

## 2016-07-22 LAB — GLUCOSE, CAPILLARY: GLUCOSE-CAPILLARY: 147 mg/dL — AB (ref 65–99)

## 2016-07-22 MED ORDER — SODIUM CHLORIDE 0.9 % IV SOLN
3.0000 g | Freq: Four times a day (QID) | INTRAVENOUS | Status: DC
  Administered 2016-07-22 – 2016-07-24 (×9): 3 g via INTRAVENOUS
  Filled 2016-07-22 (×12): qty 3

## 2016-07-22 MED ORDER — FUROSEMIDE 10 MG/ML IJ SOLN
40.0000 mg | Freq: Two times a day (BID) | INTRAMUSCULAR | Status: DC
  Administered 2016-07-22 – 2016-07-24 (×5): 40 mg via INTRAVENOUS
  Filled 2016-07-22 (×5): qty 4

## 2016-07-22 MED ORDER — METHYLPREDNISOLONE SODIUM SUCC 125 MG IJ SOLR
60.0000 mg | Freq: Four times a day (QID) | INTRAMUSCULAR | Status: DC
  Administered 2016-07-22 – 2016-07-24 (×9): 60 mg via INTRAVENOUS
  Filled 2016-07-22 (×9): qty 2

## 2016-07-22 MED ORDER — CETYLPYRIDINIUM CHLORIDE 0.05 % MT LIQD
7.0000 mL | Freq: Two times a day (BID) | OROMUCOSAL | Status: DC
  Administered 2016-07-22 – 2016-07-25 (×4): 7 mL via OROMUCOSAL

## 2016-07-22 MED ORDER — FUROSEMIDE 10 MG/ML IJ SOLN
60.0000 mg | Freq: Once | INTRAMUSCULAR | Status: AC
Start: 2016-07-22 — End: 2016-07-22
  Administered 2016-07-22: 60 mg via INTRAVENOUS
  Filled 2016-07-22: qty 6

## 2016-07-22 MED ORDER — OXYCODONE-ACETAMINOPHEN 5-325 MG PO TABS
1.0000 | ORAL_TABLET | Freq: Three times a day (TID) | ORAL | Status: DC | PRN
  Administered 2016-07-22 – 2016-07-23 (×2): 2 via ORAL
  Filled 2016-07-22 (×2): qty 2

## 2016-07-22 MED ORDER — IPRATROPIUM-ALBUTEROL 0.5-2.5 (3) MG/3ML IN SOLN
3.0000 mL | Freq: Four times a day (QID) | RESPIRATORY_TRACT | Status: DC
  Administered 2016-07-22 – 2016-07-25 (×12): 3 mL via RESPIRATORY_TRACT
  Filled 2016-07-22 (×12): qty 3

## 2016-07-22 NOTE — Plan of Care (Signed)
Report called to Chong Sicilian, RN. Patient transferred to RM 16, Step Down.  Family informed and followed to room.  Respiratory waiting to place BiPap.  Patient still unresponsive and extremely diaphoretic.  '100mg'$  lasix and Solumedrol IV given prior to transport.

## 2016-07-22 NOTE — Plan of Care (Addendum)
Patient not responding.  Vitals unstable. Temperature, extremely diaphoretic. Linens soaked.  Decline change in status.  Dr. Informed. Family notified. Staff and patient informed on rounds that patient is a DNR.  Family also confirmed her wishes.  Dr. Text to please change status. Patient has pursed lips and rapid breathing 26+ per min. Resp contacted to support and assist in care for patient.

## 2016-07-22 NOTE — Progress Notes (Signed)
Sara Mccoy                                                                                                                                                                                            Patient Demographics   Sara Mccoy, is a 82 y.o. female, DOB - 1926-04-24, XVQ:[id number]  Admit date - 07/21/2016   Admitting Physician Gladstone Lighter, MD  Outpatient Primary MD for the patient is Tracie Harrier, MD   LOS - 0  Subjective: Called by nurse due to patient being poorly responsive and requiring higher oxygenation. She is placed on full facemask. She is currently unresponsive.  Review of Systems:   CONSTITUTIONAL:Unresponsive  Vitals:   Vitals:   07/22/16 0334 07/22/16 0537 07/22/16 1000 07/22/16 1040  BP: (!) 149/65 (!) 155/65 (!) 164/82   Pulse: 80 83 (!) 104   Resp:  20 (!) 24 (!) 28  Temp:  97.9 F (36.6 C) (!) 101.8 F (38.8 C)   TempSrc:  Oral Oral   SpO2: 100% 100% 94% (!) 83%  Weight:      Height:        Wt Readings from Last 3 Encounters:  07/21/16 67.7 kg (149 lb 3.2 oz)  06/08/16 64.4 kg (142 lb)  05/15/16 64.4 kg (142 lb)     Intake/Output Summary (Last 24 hours) at 07/22/16 1054 Last data filed at 07/22/16 1038  Gross per 24 hour  Intake              576 ml  Output              625 ml  Net              -49 ml    Physical Exam:   GENERAL:Critically ill-appearing  HEAD, EYES, EARS, NOSE AND THROAT: Atraumatic, normocephalic. . Pupils equal and reactive to light. Sclerae anicteric. No conjunctival injection. No oro-pharyngeal erythema.  NECK: Supple. There is no jugular venous distention. No bruits, no lymphadenopathy, no thyromegaly.  HEART: Regular rate and rhythm,. No murmurs, no rubs, no clicks.  LUNGS: Clear to auscultation bilaterally. No rales or rhonchi. No wheezes.  ABDOMEN: Soft, flat, nontender, nondistended. Has good bowel sounds. No hepatosplenomegaly appreciated.   EXTREMITIES: No evidence of any cyanosis, clubbing, or peripheral edema.  +2 pedal and radial pulses bilaterally.  NEUROLOGIC: Poorly responsive  SKIN: Moist and warm with no rashes appreciated.  Psych: Not responsive  LN: No inguinal LN enlargement    Antibiotics   Anti-infectives    None      Medications   Scheduled Meds: . amLODipine  5 mg Oral Daily  . B-complex with vitamin C  1 tablet Oral Daily  . calcium-vitamin D  1 tablet Oral BID  . cholecalciferol  1,000 Units Oral Daily  . enoxaparin (LOVENOX) injection  40 mg Subcutaneous Q24H  . furosemide  60 mg Intravenous Once  . ipratropium-albuterol  3 mL Nebulization Q6H  . methylPREDNISolone (SOLU-MEDROL) injection  60 mg Intravenous Q6H  . mometasone-formoterol  2 puff Inhalation BID  . multivitamin with minerals  1 tablet Oral Daily  . pantoprazole  40 mg Oral QAC breakfast  . sodium chloride flush  3 mL Intravenous Q12H  . venlafaxine  37.5 mg Oral BID  . vitamin B-12  5,000 mcg Oral Daily  . vitamin C  1,000 mg Oral Daily  . vitamin E  1,000 Units Oral Daily   Continuous Infusions:  PRN Meds:.acetaminophen **OR** acetaminophen, ALPRAZolam, ondansetron **OR** ondansetron (ZOFRAN) IV   Data Review:   Micro Results Recent Results (from the past 240 hour(s))  Gastrointestinal Panel by PCR , Stool     Status: Abnormal   Collection Time: 07/13/16 12:30 PM  Result Value Ref Range Status   Campylobacter species NOT DETECTED NOT DETECTED Final   Plesimonas shigelloides NOT DETECTED NOT DETECTED Final   Salmonella species NOT DETECTED NOT DETECTED Final   Yersinia enterocolitica NOT DETECTED NOT DETECTED Final   Vibrio species NOT DETECTED NOT DETECTED Final   Vibrio cholerae NOT DETECTED NOT DETECTED Final   Enteroaggregative E coli (EAEC) NOT DETECTED NOT DETECTED Final   Enteropathogenic E coli (EPEC) NOT DETECTED NOT DETECTED Final   Enterotoxigenic E coli (ETEC) NOT DETECTED NOT DETECTED Final   Shiga like  toxin producing E coli (STEC) NOT DETECTED NOT DETECTED Final   E. coli O157 NOT DETECTED NOT DETECTED Final   Shigella/Enteroinvasive E coli (EIEC) NOT DETECTED NOT DETECTED Final   Cryptosporidium NOT DETECTED NOT DETECTED Final   Cyclospora cayetanensis NOT DETECTED NOT DETECTED Final   Entamoeba histolytica NOT DETECTED NOT DETECTED Final   Giardia lamblia NOT DETECTED NOT DETECTED Final   Adenovirus F40/41 NOT DETECTED NOT DETECTED Final   Astrovirus NOT DETECTED NOT DETECTED Final   Norovirus GI/GII DETECTED (A) NOT DETECTED Final    Comment: CRITICAL RESULT CALLED TO, READ BACK BY AND VERIFIED WITH: RHONDA WALKER AT 1207 07/15/16 SDR    Rotavirus A NOT DETECTED NOT DETECTED Final   Sapovirus (I, II, IV, and V) NOT DETECTED NOT DETECTED Final  C difficile quick scan w PCR reflex     Status: None   Collection Time: 07/13/16 12:30 PM  Result Value Ref Range Status   C Diff antigen NEGATIVE NEGATIVE Final   C Diff toxin NEGATIVE NEGATIVE Final   C Diff interpretation No C. difficile detected.  Final    Radiology Reports US Pelvis Complete  Result Date: 07/14/2016 CLINICAL DATA:  Left ovarian cyst. EXAM: TRANSABDOMINAL ULTRASOUND OF PELVIS TECHNIQUE: Transabdominal ultrasound examination of the pelvis was performed including evaluation of the uterus, ovaries, adnexal regions, and pelvic cul-de-sac. COMPARISON:  Abdominal CT 04/07/2016. CT 04/11/2002 not available for review FINDINGS: Uterus Surgically absent Right ovary Not visualized, normal on previous CT. Left ovary In the same location as ovarian cyst seen on previous abdominal CT there is a similarly sized 32 x 24 x 23 mm simple appearing cyst. No internal nodularity or septation is noted. Other findings:  No abnormal free fluid. IMPRESSION: 32 x 24 x 23 mm left pelvic cyst correlating with ovarian cyst on  May 2017 abdominal CT. The cyst has a simple, benign appearance and could be followed up in 1 year with ultrasound - if clinically  appropriate. Electronically Signed   By: Monte Fantasia M.D.   On: 07/14/2016 13:52     CBC  Recent Labs Lab 07/21/16 1848 07/21/16 2302 07/22/16 0722  WBC 8.0 8.1 9.7  HGB 6.2* 6.6* 9.1*  HCT 18.8* 20.2* 26.4*  PLT 109* 132* 127*  MCV 111.4* 110.9* 100.1*  MCH 36.6* 36.4* 34.3*  MCHC 32.9 32.8 34.2  RDW 18.9* 19.4* 25.0*    Chemistries   Recent Labs Lab 07/21/16 1848 07/21/16 2302 07/22/16 0722  NA 137 137 135  K 4.2 4.1 4.5  CL 96* 93* 96*  CO2 35* 37* 38*  GLUCOSE 102* 115* 101*  BUN 24* 25* 23*  CREATININE 0.71 0.84 0.74  CALCIUM 9.2 8.8* 8.5*  MG  --  1.9  --    ------------------------------------------------------------------------------------------------------------------ estimated creatinine clearance is 42.8 mL/min (by C-G formula based on SCr of 0.8 mg/dL). ------------------------------------------------------------------------------------------------------------------ No results for input(s): HGBA1C in the last 72 hours. ------------------------------------------------------------------------------------------------------------------ No results for input(s): CHOL, HDL, LDLCALC, TRIG, CHOLHDL, LDLDIRECT in the last 72 hours. ------------------------------------------------------------------------------------------------------------------ No results for input(s): TSH, T4TOTAL, T3FREE, THYROIDAB in the last 72 hours.  Invalid input(s): FREET3 ------------------------------------------------------------------------------------------------------------------  Recent Labs  07/21/16 2302  VITAMINB12 662  FOLATE 14.4  FERRITIN 709*  TIBC 232*  IRON 105    Coagulation profile  Recent Labs Lab 07/22/16 0722  INR 1.10    No results for input(s): DDIMER in the last 72 hours.  Cardiac Enzymes  Recent Labs Lab 07/21/16 1848 07/21/16 2302  TROPONINI 0.03* 0.04*    ------------------------------------------------------------------------------------------------------------------ Invalid input(s): POCBNP    Assessment & Plan   Patient's 82 year old who was admitted for anemia due to preop labs now having acute respiratory failure  1. Acute hypoxic respiratory failure Could be related to fluid overload from receiving blood and IV fluids I will place her on full facemask oxygen IV Lasix Stat chest x-ray Stat ABG DO NOT RESUSCITATE status is confirmed with the family I will also treat her for COPD exasperation started on Solu-Medrol schedule nebs  2. Anemia due to chronic lower GI bleed Status post transfusion with improvement in her hemoglobin  3. Lung cancer Outpatient follow-up as previously  4. Prolapse rectum Outpatient follow-up       Code Status Orders        Start     Ordered   07/22/16 1049  Do not attempt resuscitation (DNR)  Continuous    Question Answer Comment  In the event of cardiac or respiratory ARREST Do not call a "code blue"   In the event of cardiac or respiratory ARREST Do not perform Intubation, CPR, defibrillation or ACLS   In the event of cardiac or respiratory ARREST Use medication by any route, position, wound care, and other measures to relive pain and suffering. May use oxygen, suction and manual treatment of airway obstruction as needed for comfort.      07/22/16 1048    Code Status History    Date Active Date Inactive Code Status Order ID Comments User Context   07/21/2016 10:33 PM 07/22/2016 10:48 AM Full Code [id number]  [name], DO ED   07/21/2016 10:33 PM 07/21/2016 10:33 PM DNR [id number]  [name], DO ED   04/25/2016  3:51 AM 04/26/2016  7:38 PM DNR [id number]  [name], MD Inpatient   04/25/2016  3:29 AM 04/25/2016  3:29 AM Full Code [id number]  [name], MD ED    Advance Directive Documentation   Flowsheet Row Most Recent Value  Type of Advance Directive  Living  will, Healthcare Power of Attorney  Pre-existing out of facility DNR order (yellow form or pink MOST form)  No data  "MOST" Form in Place?  No data             DVT Prophylaxis SCDs  Lab Results  Component Value Date   PLT 127 (L) 07/22/2016     Time Spent in minutes  39mn critical care time spent  Greater than 50% of time spent in care coordination and counseling patient regarding the condition and plan of care.   PDustin FlockM.D on 07/22/2016 at 10:54 AM  Between 7am to 6pm - Pager - [phone]  After 6pm go to [email or web address] - password [email or web address]   Office  [phone]

## 2016-07-22 NOTE — Progress Notes (Signed)
Pt awake, A&O.  C/O chronic back pain.  Informed Prime Doc.  New orders received.  Will continue to monitor.

## 2016-07-22 NOTE — Progress Notes (Signed)
Received patient from [place] to [place], immediately removed NRB and placed on Bipap 18/8 79% without complication. Will continue to monitor.

## 2016-07-22 NOTE — Progress Notes (Addendum)
Called for a rapid response around 11:05.  Patient found agonal breathing and unresponsive.  Vitals stable- NSR in 90's sating 100% on non-rebreather mask. Patient diaphoretic.  Lasix given- chest xray and ABG ordered. Family at beside.  Dr. Posey Pronto ordered for patient to transfer to ICU as stepdown- I read results of the ABG to Dr. Posey Pronto and he stated to place patient on bipap.  Notified RT.

## 2016-07-22 NOTE — Progress Notes (Signed)
   07/22/16 1500  Clinical Encounter Type  Visited With Family  Visit Type Code  Consult/Referral To Chaplain  Chaplain responded to code and provided a compassionate presence to family and prayer as requested.   [place] [phone]

## 2016-07-22 NOTE — Progress Notes (Signed)
Case discussed with daughter she states that lung cancer is a previous diagnosis, They stated that patient was doing well up until yesterday Even though she has a DO NOT RESUSCITATE they still would like to treat her aggressively for now.

## 2016-07-22 NOTE — Progress Notes (Signed)
Pharmacy Antibiotic Note  Sara Mccoy is a 82 y.o. female admitted on 07/21/2016 with aspiration pneumonia.  Pharmacy has been consulted for Unasyn dosing.  Plan: Unasyn 3 g IV q6h  Height: '5\' 1"'$  (154.9 cm) Weight: 149 lb 3.2 oz (67.7 kg) IBW/kg (Calculated) : 47.8  Temp (24hrs), Avg:98.5 F (36.9 C), Min:97.8 F (36.6 C), Max:101.8 F (38.8 C)   Recent Labs Lab 07/21/16 1848 07/21/16 2302 07/22/16 0722  WBC 8.0 8.1 9.7  CREATININE 0.71 0.84 0.74    Estimated Creatinine Clearance: 42.8 mL/min (by C-G formula based on SCr of 0.8 mg/dL).    No Known Allergies  Antimicrobials this admission: Unasyn 8/16 >>    Dose adjustments this admission:   Microbiology results: none  Thank you for allowing pharmacy to be a part of this patient's care.  Rocky Morel 07/22/2016 11:21 AM

## 2016-07-22 NOTE — Progress Notes (Signed)
RT paged for rapid response,  Patient placed on NRB upon arrival by Rodman Key, RN.  O2 sat up to 100%.  ABG obtained, results pending.

## 2016-07-23 LAB — TYPE AND SCREEN
ABO/RH(D): O POS
Antibody Screen: NEGATIVE
UNIT DIVISION: 0
Unit division: 0

## 2016-07-23 LAB — BLOOD GAS, ARTERIAL
ALLENS TEST (PASS/FAIL): POSITIVE — AB
Acid-Base Excess: 17.2 mmol/L — ABNORMAL HIGH (ref 0.0–3.0)
Bicarbonate: 43.8 mEq/L — ABNORMAL HIGH (ref 21.0–28.0)
DELIVERY SYSTEMS: POSITIVE
EXPIRATORY PAP: 8
FIO2: 40
INSPIRATORY PAP: 18
O2 Saturation: 98.1 %
PCO2 ART: 66 mmHg — AB (ref 32.0–48.0)
PH ART: 7.43 (ref 7.350–7.450)
Patient temperature: 37
pO2, Arterial: 104 mmHg (ref 83.0–108.0)

## 2016-07-23 LAB — BASIC METABOLIC PANEL WITH GFR
Anion gap: 12 (ref 5–15)
BUN: 37 mg/dL — ABNORMAL HIGH (ref 6–20)
CO2: 36 mmol/L — ABNORMAL HIGH (ref 22–32)
Calcium: 8.5 mg/dL — ABNORMAL LOW (ref 8.9–10.3)
Chloride: 90 mmol/L — ABNORMAL LOW (ref 101–111)
Creatinine, Ser: 1.09 mg/dL — ABNORMAL HIGH (ref 0.44–1.00)
GFR calc Af Amer: 51 mL/min — ABNORMAL LOW
GFR calc non Af Amer: 44 mL/min — ABNORMAL LOW
Glucose, Bld: 127 mg/dL — ABNORMAL HIGH (ref 65–99)
Potassium: 4.2 mmol/L (ref 3.5–5.1)
Sodium: 138 mmol/L (ref 135–145)

## 2016-07-23 MED ORDER — VENLAFAXINE HCL 25 MG PO TABS
37.5000 mg | ORAL_TABLET | Freq: Two times a day (BID) | ORAL | Status: DC
  Administered 2016-07-23 – 2016-07-27 (×9): 37.5 mg via ORAL
  Filled 2016-07-23 (×3): qty 1
  Filled 2016-07-23 (×7): qty 2

## 2016-07-23 NOTE — Progress Notes (Signed)
[place] at [facility]                                                                                                                                                                                            Patient Demographics   Sara Mccoy, is a 82 y.o. female, DOB - [birth year], BPZ:[id number]  Admit date - 07/21/2016   Admitting Physician Gladstone Lighter, MD  Outpatient Primary MD for the patient is HANDE,VISHWANATH, MD   LOS - 1  Subjective: Patient continues to be on BiPAP this morning more awake and denies any chest pain or palpitations  Review of Systems:    CONSTITUTIONAL: No documented fever. No fatigue, weakness. No weight gain, no weight loss.  EYES: No blurry or double vision.  ENT: No tinnitus. No postnasal drip. No redness of the oropharynx.  RESPIRATORY: No cough, no wheeze, no hemoptysis. Positive  CARDIOVASCULAR: No chest pain. No orthopnea. No palpitations. No syncope.  GASTROINTESTINAL: No nausea, no vomiting or diarrhea. No abdominal pain. No melena or hematochezia.  GENITOURINARY:  No urgency. No frequency. No dysuria. No hematuria. No obstructive symptoms. No discharge. No pain. No significant abnormal bleeding ENDOCRINE: No polyuria or nocturia. No heat or cold intolerance.  HEMATOLOGY: No anemia. No bruising. No bleeding. No purpura. No petechiae INTEGUMENTARY: No rashes. No lesions.  MUSCULOSKELETAL: No arthritis. No swelling. No gout.  NEUROLOGIC: No numbness, tingling, or ataxia. No seizure-type activity.  PSYCHIATRIC: No anxiety. No insomnia. No ADD.     Vitals:   Vitals:   07/23/16 0900 07/23/16 0929 07/23/16 1000 07/23/16 1100  BP: (!) 140/94  (!) 116/49 (!) 121/48  Pulse: 69  84 83  Resp: 15  16 (!) 25  Temp:      TempSrc:      SpO2: 100% 97% 93% 95%  Weight:      Height:        Wt Readings from Last 3 Encounters:  07/22/16 68.1 kg (150 lb 2.1 oz)  06/08/16 64.4 kg (142 lb)  05/15/16 64.4 kg  (142 lb)     Intake/Output Summary (Last 24 hours) at 07/23/16 1258 Last data filed at 07/22/16 1736  Gross per 24 hour  Intake              200 ml  Output                0 ml  Net              200 ml    Physical Exam:   GENERAL:Critically ill-appearing On BiPAP HEAD, EYES, EARS, NOSE AND THROAT: Atraumatic,  normocephalic. . Pupils equal and reactive to light. Sclerae anicteric. No conjunctival injection. No oro-pharyngeal erythema.  NECK: Supple. There is no jugular venous distention. No bruits, no lymphadenopathy, no thyromegaly.  HEART: Regular rate and rhythm,. No murmurs, no rubs, no clicks.  LUNGS: Diminished breath sounds bilaterally No rales or rhonchi. No wheezes.  ABDOMEN: Soft, flat, nontender, nondistended. Has good bowel sounds. No hepatosplenomegaly appreciated.  EXTREMITIES: No evidence of any cyanosis, clubbing, or peripheral edema.  +2 pedal and radial pulses bilaterally.  NEUROLOGIC: Poorly responsive  SKIN: Moist and warm with no rashes appreciated.  Psych: Not responsive  LN: No inguinal LN enlargement    Antibiotics   Anti-infectives    Start     Dose/Rate Route Frequency Ordered Stop   07/22/16 1200  Ampicillin-Sulbactam (UNASYN) 3 g in sodium chloride 0.9 % 100 mL IVPB     3 g 100 mL/hr over 60 Minutes Intravenous Every 6 hours 07/22/16 1120        Medications   Scheduled Meds: . ampicillin-sulbactam (UNASYN) IV  3 g Intravenous Q6H  . antiseptic oral rinse  7 mL Mouth Rinse q12n4p  . furosemide  40 mg Intravenous Q12H  . ipratropium-albuterol  3 mL Nebulization Q6H  . methylPREDNISolone (SOLU-MEDROL) injection  60 mg Intravenous Q6H  . mometasone-formoterol  2 puff Inhalation BID  . sodium chloride flush  3 mL Intravenous Q12H   Continuous Infusions:  PRN Meds:.acetaminophen **OR** acetaminophen, ondansetron **OR** ondansetron (ZOFRAN) IV, oxyCODONE-acetaminophen   Data Review:   Micro Results Recent Results (from the past 240 hour(s))   MRSA PCR Screening     Status: None   Collection Time: 07/22/16 12:23 PM  Result Value Ref Range Status   MRSA by PCR NEGATIVE NEGATIVE Final    Comment:        The GeneXpert MRSA Assay (FDA approved for NASAL specimens only), is one component of a comprehensive MRSA colonization surveillance program. It is not intended to diagnose MRSA infection nor to guide or monitor treatment for MRSA infections.     Radiology Reports Dg Chest 1 View  Result Date: 07/22/2016 CLINICAL DATA:  Respiratory failure.  COPD.  History of lung cancer. EXAM: CHEST 1 VIEW COMPARISON:  05/15/2016 and 04/25/2016 FINDINGS: There is new interstitial infiltrate throughout the left lung. New distention of the azygos vein and it new slight cardiomegaly. There is an increased loculated small right pleural effusion. Surgical clips in the right hilum. No acute bone abnormality. Old deformity of the proximal left humerus. IMPRESSION: Findings consistent with interstitial pulmonary edema with new pulmonary vascular congestion, cardiomegaly, and increased small right pleural effusion Electronically Signed   By: Lorriane Shire M.D.   On: 07/22/2016 11:39   US Pelvis Complete  Result Date: 07/14/2016 CLINICAL DATA:  Left ovarian cyst. EXAM: TRANSABDOMINAL ULTRASOUND OF PELVIS TECHNIQUE: Transabdominal ultrasound examination of the pelvis was performed including evaluation of the uterus, ovaries, adnexal regions, and pelvic cul-de-sac. COMPARISON:  Abdominal CT 04/07/2016. CT 04/11/2002 not available for review FINDINGS: Uterus Surgically absent Right ovary Not visualized, normal on previous CT. Left ovary In the same location as ovarian cyst seen on previous abdominal CT there is a similarly sized 32 x 24 x 23 mm simple appearing cyst. No internal nodularity or septation is noted. Other findings:  No abnormal free fluid. IMPRESSION: 32 x 24 x 23 mm left pelvic cyst correlating with ovarian cyst on May 2017 abdominal CT. The cyst  has a simple, benign appearance and could be followed  up in 1 year with ultrasound - if clinically appropriate. Electronically Signed   By: Monte Fantasia M.D.   On: 07/14/2016 13:52     CBC  Recent Labs Lab 07/21/16 1848 07/21/16 2302 07/22/16 0722  WBC 8.0 8.1 9.7  HGB 6.2* 6.6* 9.1*  HCT 18.8* 20.2* 26.4*  PLT 109* 132* 127*  MCV 111.4* 110.9* 100.1*  MCH 36.6* 36.4* 34.3*  MCHC 32.9 32.8 34.2  RDW 18.9* 19.4* 25.0*    Chemistries   Recent Labs Lab 07/21/16 1848 07/21/16 2302 07/22/16 0722 07/23/16 0846  NA 137 137 135 138  K 4.2 4.1 4.5 4.2  CL 96* 93* 96* 90*  CO2 35* 37* 38* 36*  GLUCOSE 102* 115* 101* 127*  BUN 24* 25* 23* 37*  CREATININE 0.71 0.84 0.74 1.09*  CALCIUM 9.2 8.8* 8.5* 8.5*  MG  --  1.9  --   --    ------------------------------------------------------------------------------------------------------------------ estimated creatinine clearance is 33.1 mL/min (by C-G formula based on SCr of 1.09 mg/dL). ------------------------------------------------------------------------------------------------------------------ No results for input(s): HGBA1C in the last 72 hours. ------------------------------------------------------------------------------------------------------------------ No results for input(s): CHOL, HDL, LDLCALC, TRIG, CHOLHDL, LDLDIRECT in the last 72 hours. ------------------------------------------------------------------------------------------------------------------ No results for input(s): TSH, T4TOTAL, T3FREE, THYROIDAB in the last 72 hours.  Invalid input(s): FREET3 ------------------------------------------------------------------------------------------------------------------  Recent Labs  07/21/16 2302  VITAMINB12 662  FOLATE 14.4  FERRITIN 709*  TIBC 232*  IRON 105    Coagulation profile  Recent Labs Lab 07/22/16 0722  INR 1.10    No results for input(s): DDIMER in the last 72 hours.  Cardiac  Enzymes  Recent Labs Lab 07/21/16 1848 07/21/16 2302  TROPONINI 0.03* 0.04*   ------------------------------------------------------------------------------------------------------------------ Invalid input(s): POCBNP    Assessment & Plan   Patient's 82 year old who was admitted for anemia due to preop labs now having acute respiratory failure  1. Acute hypoxicAnd hypercarbic respiratory failure Due to acute fluid overload and acute COPD exasperation We'll continue Lasix therapy as well as nebulizers and steroids We'll try to transition her to high flow nasal cannula if possible   2. Anemia due to chronic lower GI bleed Status post transfusion with improvement in her hemoglobin  3. Lung cancer Outpatient follow-up as previously  4. Prolapse rectum Outpatient follow-up       Code Status Orders        Start     Ordered   07/22/16 1049  Do not attempt resuscitation (DNR)  Continuous    Question Answer Comment  In the event of cardiac or respiratory ARREST Do not call a "code blue"   In the event of cardiac or respiratory ARREST Do not perform Intubation, CPR, defibrillation or ACLS   In the event of cardiac or respiratory ARREST Use medication by any route, position, wound care, and other measures to relive pain and suffering. May use oxygen, suction and manual treatment of airway obstruction as needed for comfort.      07/22/16 1048    Code Status History    Date Active Date Inactive Code Status Order ID Comments User Context   07/21/2016 10:33 PM 07/22/2016 10:48 AM Full Code [id number]  [name], DO ED   07/21/2016 10:33 PM 07/21/2016 10:33 PM DNR [id number]  [name], DO ED   04/25/2016  3:51 AM 04/26/2016  7:38 PM DNR [id number]  [name], MD Inpatient   04/25/2016  3:29 AM 04/25/2016  3:29 AM Full Code [id number]  [name], MD ED    Advance Directive Documentation  Flowsheet Row Most Recent Value  Type of Advance Directive  Living  will, Healthcare Power of Attorney  Pre-existing out of facility DNR order (yellow form or pink MOST form)  No data  "MOST" Form in Place?  No data             DVT Prophylaxis SCDs  Lab Results  Component Value Date   PLT 127 (L) 07/22/2016     Time Spent in minutes  31mn critical care time spent  Greater than 50% of time spent in care coordination and counseling patient regarding the condition and plan of care.   PDustin FlockM.D on 07/23/2016 at 12:58 PM  Between 7am to 6pm - Pager - [phone]  After 6pm go to [email or web address] - password [email or web address]   Office  [phone]

## 2016-07-23 NOTE — Progress Notes (Signed)
Patient has MD order to transfer to medsurg with telemetry. Report called to Towanda Octave. Patient to be transported in bed with telemetry to be applied. Family at bedside notify to transfer.

## 2016-07-23 NOTE — Progress Notes (Signed)
Pharmacy Antibiotic Note  Sara Mccoy is a 82 y.o. female admitted on 07/21/2016 with aspiration pneumonia.  Pharmacy has been consulted for Unasyn dosing.  Plan: Continue Unasyn 3 g IV q6h  Height: '5\' 3"'$  (160 cm) Weight: 150 lb 2.1 oz (68.1 kg) IBW/kg (Calculated) : 52.4  Temp (24hrs), Avg:97.7 F (36.5 C), Min:97.5 F (36.4 C), Max:97.8 F (36.6 C)   Recent Labs Lab 07/21/16 1848 07/21/16 2302 07/22/16 0722 07/23/16 0846  WBC 8.0 8.1 9.7  --   CREATININE 0.71 0.84 0.74 1.09*    Estimated Creatinine Clearance: 33.1 mL/min (by C-G formula based on SCr of 1.09 mg/dL).    No Known Allergies  Antimicrobials this admission: Unasyn 8/16 >>    Dose adjustments this admission:   Microbiology results: 8/16 MRSA PCR: negative  Thank you for allowing pharmacy to be a part of this patient's care.  Ulice Dash D 07/23/2016 1:32 PM

## 2016-07-23 NOTE — Progress Notes (Signed)
Patient transport in bed with telemetry monitoring and NRB mask in place. Transported by nurse tech and Respiratory therapist.

## 2016-07-23 NOTE — Care Management Note (Signed)
Case Management Note  Patient Details  Name: Sara Mccoy MRN: [id number] Date of Birth: September 29, 1926  Subjective/Objective:                  Met with patient, granddaughter and her daughter/HCPOA Tonny Bollman [phone] to discuss discharge planning. Patient was a [facility] patient in 2009. Patient's PCP is Dr. Ginette Pitman. She states she has a long-term care policy but not sure what it says. Patient is from home alone- adult children check on her and provide meals. Patient uses a rollator to get around and does well usually with independent living. She is on chronic O2 through [place]. She denies problems obtaining medications or getting to appointments. She is currently on HF O2. [name] does not feel that patient would need PT at discharge but will consider home health if needed.  Action/Plan: RNCM to follow for DME and home health needs.   Expected Discharge Date:                  Expected Discharge Plan:     In-House Referral:     Discharge planning Services     Post Acute Care Choice:  Home Health Choice offered to:  Patient, Adult Children  DME Arranged:    DME Agency:     HH Arranged:    [facility] Agency:     Status of Service:  In process, will continue to follow  If discussed at Long Length of Stay Meetings, dates discussed:    Additional Comments:  Marshell Garfinkel, RN 07/23/2016, 11:35 AM

## 2016-07-24 ENCOUNTER — Inpatient Hospital Stay
Admit: 2016-07-24 | Discharge: 2016-07-24 | Disposition: A | Discharge status: Home / Self Care | Payer: PPO | Attending: Internal Medicine | Admitting: Internal Medicine

## 2016-07-24 LAB — TSH: TSH: 2.386 u[IU]/mL (ref 0.350–4.500)

## 2016-07-24 MED ORDER — FUROSEMIDE 10 MG/ML IJ SOLN
40.0000 mg | Freq: Every day | INTRAMUSCULAR | Status: DC
  Administered 2016-07-25: 40 mg via INTRAVENOUS
  Filled 2016-07-24: qty 4

## 2016-07-24 MED ORDER — DILTIAZEM HCL 30 MG PO TABS
30.0000 mg | ORAL_TABLET | Freq: Four times a day (QID) | ORAL | Status: DC
  Administered 2016-07-24: 30 mg via ORAL
  Filled 2016-07-24: qty 1

## 2016-07-24 MED ORDER — DILTIAZEM HCL 25 MG/5ML IV SOLN: 10.0000 mg | Freq: Four times a day (QID) | INTRAVENOUS | Status: DC | PRN

## 2016-07-24 MED ORDER — METOPROLOL TARTRATE 25 MG PO TABS
25.0000 mg | ORAL_TABLET | Freq: Two times a day (BID) | ORAL | Status: DC
  Administered 2016-07-24 (×2): 25 mg via ORAL
  Filled 2016-07-24 (×3): qty 1

## 2016-07-24 MED ORDER — PREDNISONE 50 MG PO TABS
50.0000 mg | ORAL_TABLET | Freq: Every day | ORAL | Status: DC
  Administered 2016-07-25 – 2016-07-26 (×2): 50 mg via ORAL
  Filled 2016-07-24 (×2): qty 1

## 2016-07-24 MED ORDER — ALPRAZOLAM 0.5 MG PO TABS
0.5000 mg | ORAL_TABLET | Freq: Every day | ORAL | Status: DC
  Administered 2016-07-24 – 2016-07-26 (×3): 0.5 mg via ORAL
  Filled 2016-07-24 (×3): qty 1

## 2016-07-24 MED ORDER — DILTIAZEM HCL 25 MG/5ML IV SOLN
10.0000 mg | Freq: Once | INTRAVENOUS | Status: AC
  Administered 2016-07-24: 10 mg via INTRAVENOUS
  Filled 2016-07-24: qty 5

## 2016-07-24 NOTE — Progress Notes (Signed)
Pharmacy Antibiotic Note  Sara Mccoy is a 82 y.o. female admitted on 07/21/2016 with aspiration pneumonia.  Pharmacy has been consulted for Unasyn dosing.  Plan: Continue Unasyn 3 g IV q6h  Height: '5\' 1"'$  (154.9 cm) Weight: 163 lb 8 oz (74.2 kg) IBW/kg (Calculated) : 47.8  Temp (24hrs), Avg:98.7 F (37.1 C), Min:98.2 F (36.8 C), Max:99.7 F (37.6 C)   Recent Labs Lab 07/21/16 1848 07/21/16 2302 07/22/16 0722 07/23/16 0846  WBC 8.0 8.1 9.7  --   CREATININE 0.71 0.84 0.74 1.09*    Estimated Creatinine Clearance: 32.9 mL/min (by C-G formula based on SCr of 1.09 mg/dL).    No Known Allergies  Antimicrobials this admission: Unasyn 8/16 >>    Dose adjustments this admission:   Microbiology results: 8/16 MRSA PCR: negative  Thank you for allowing pharmacy to be a part of this patient's care.  Sara Mccoy C 07/24/2016 11:13 AM

## 2016-07-24 NOTE — Progress Notes (Signed)
[place] [facility]                                                                                                                                                                                            Patient Demographics   Sara Mccoy, is a 82 y.o. female, DOB - 02-Mar-1926, QVZ:[id number]  Admit date - 07/21/2016   Admitting Physician Gladstone Lighter, MD  Outpatient Primary MD for the patient is HANDE,VISHWANATH, MD   LOS - 2  Subjective:  Patient overnight developed atrial fibrillation with rapid ventricular rate had to be transferred Currently on 2 L of oxygen   Review of Systems:    CONSTITUTIONAL: No documented fever. No fatigue, weakness. No weight gain, no weight loss.  EYES: No blurry or double vision.  ENT: No tinnitus. No postnasal drip. No redness of the oropharynx.  RESPIRATORY: No cough, no wheeze, no hemoptysis. Positive  CARDIOVASCULAR: No chest pain. No orthopnea. No palpitations. No syncope.  GASTROINTESTINAL: No nausea, no vomiting or diarrhea. No abdominal pain. No melena or hematochezia.  GENITOURINARY:  No urgency. No frequency. No dysuria. No hematuria. No obstructive symptoms. No discharge. No pain. No significant abnormal bleeding ENDOCRINE: No polyuria or nocturia. No heat or cold intolerance.  HEMATOLOGY: No anemia. No bruising. No bleeding. No purpura. No petechiae INTEGUMENTARY: No rashes. No lesions.  MUSCULOSKELETAL: No arthritis. No swelling. No gout.  NEUROLOGIC: No numbness, tingling, or ataxia. No seizure-type activity.  PSYCHIATRIC: No anxiety. No insomnia. No ADD.     Vitals:   Vitals:   07/24/16 0938 07/24/16 0941 07/24/16 1045 07/24/16 1126  BP: (!) 110/50  (!) 104/51 122/67  Pulse:   92 (!) 102  Resp: (!) 24   18  Temp: 98.9 F (37.2 C)   98.3 F (36.8 C)  TempSrc: Oral   Oral  SpO2: 96%   98%  Weight:  74.2 kg (163 lb 8 oz)    Height:  '5\' 1"'$  (1.549 m)      Wt Readings from  Last 3 Encounters:  07/24/16 74.2 kg (163 lb 8 oz)  06/08/16 64.4 kg (142 lb)  05/15/16 64.4 kg (142 lb)    No intake or output data in the 24 hours ending 07/24/16 1239  Physical Exam:   GENERAL:Patient doing better chronically ill-appearing HEAD, EYES, EARS, NOSE AND THROAT: Atraumatic, normocephalic. . Pupils equal and reactive to light. Sclerae anicteric. No conjunctival injection. No oro-pharyngeal erythema.  NECK: Supple. There is no jugular venous distention. No bruits, no lymphadenopathy, no thyromegaly.  HEART: Regular rate and rhythm,. No murmurs, no rubs, no clicks.  LUNGS: Diminished breath sounds bilaterally No rales or rhonchi. No wheezes.  ABDOMEN: Soft, flat, nontender, nondistended. Has good bowel sounds. No hepatosplenomegaly appreciated.  EXTREMITIES: No evidence of any cyanosis, clubbing, or peripheral edema.  +2 pedal and radial pulses bilaterally.  NEUROLOGIC: Poorly responsive  SKIN: Moist and warm with no rashes appreciated.  Psych: Not responsive  LN: No inguinal LN enlargement    Antibiotics   Anti-infectives    Start     Dose/Rate Route Frequency Ordered Stop   07/22/16 1200  Ampicillin-Sulbactam (UNASYN) 3 g in sodium chloride 0.9 % 100 mL IVPB     3 g 100 mL/hr over 60 Minutes Intravenous Every 6 hours 07/22/16 1120        Medications   Scheduled Meds: . ampicillin-sulbactam (UNASYN) IV  3 g Intravenous Q6H  . antiseptic oral rinse  7 mL Mouth Rinse q12n4p  . furosemide  40 mg Intravenous Q12H  . ipratropium-albuterol  3 mL Nebulization Q6H  . methylPREDNISolone (SOLU-MEDROL) injection  60 mg Intravenous Q6H  . metoprolol tartrate  25 mg Oral BID  . mometasone-formoterol  2 puff Inhalation BID  . sodium chloride flush  3 mL Intravenous Q12H  . venlafaxine  37.5 mg Oral BID   Continuous Infusions:  PRN Meds:.acetaminophen **OR** acetaminophen, diltiazem, ondansetron **OR** ondansetron (ZOFRAN) IV, oxyCODONE-acetaminophen   Data Review:    Micro Results Recent Results (from the past 240 hour(s))  MRSA PCR Screening     Status: None   Collection Time: 07/22/16 12:23 PM  Result Value Ref Range Status   MRSA by PCR NEGATIVE NEGATIVE Final    Comment:        The GeneXpert MRSA Assay (FDA approved for NASAL specimens only), is one component of a comprehensive MRSA colonization surveillance program. It is not intended to diagnose MRSA infection nor to guide or monitor treatment for MRSA infections.     Radiology Reports Dg Chest 1 View  Result Date: 07/22/2016 CLINICAL DATA:  Respiratory failure.  COPD.  History of lung cancer. EXAM: CHEST 1 VIEW COMPARISON:  05/15/2016 and 04/25/2016 FINDINGS: There is new interstitial infiltrate throughout the left lung. New distention of the azygos vein and it new slight cardiomegaly. There is an increased loculated small right pleural effusion. Surgical clips in the right hilum. No acute bone abnormality. Old deformity of the proximal left humerus. IMPRESSION: Findings consistent with interstitial pulmonary edema with new pulmonary vascular congestion, cardiomegaly, and increased small right pleural effusion Electronically Signed   By: Lorriane Shire M.D.   On: 07/22/2016 11:39   US Pelvis Complete  Result Date: 07/14/2016 CLINICAL DATA:  Left ovarian cyst. EXAM: TRANSABDOMINAL ULTRASOUND OF PELVIS TECHNIQUE: Transabdominal ultrasound examination of the pelvis was performed including evaluation of the uterus, ovaries, adnexal regions, and pelvic cul-de-sac. COMPARISON:  Abdominal CT 04/07/2016. CT 04/11/2002 not available for review FINDINGS: Uterus Surgically absent Right ovary Not visualized, normal on previous CT. Left ovary In the same location as ovarian cyst seen on previous abdominal CT there is a similarly sized 32 x 24 x 23 mm simple appearing cyst. No internal nodularity or septation is noted. Other findings:  No abnormal free fluid. IMPRESSION: 32 x 24 x 23 mm left pelvic cyst  correlating with ovarian cyst on May 2017 abdominal CT. The cyst has a simple, benign appearance and could be followed up in 1 year with ultrasound - if clinically appropriate. Electronically Signed   By: Monte Fantasia M.D.   On: 07/14/2016 13:52  CBC  Recent Labs Lab 07/21/16 1848 07/21/16 2302 07/22/16 0722  WBC 8.0 8.1 9.7  HGB 6.2* 6.6* 9.1*  HCT 18.8* 20.2* 26.4*  PLT 109* 132* 127*  MCV 111.4* 110.9* 100.1*  MCH 36.6* 36.4* 34.3*  MCHC 32.9 32.8 34.2  RDW 18.9* 19.4* 25.0*    Chemistries   Recent Labs Lab 07/21/16 1848 07/21/16 2302 07/22/16 0722 07/23/16 0846  NA 137 137 135 138  K 4.2 4.1 4.5 4.2  CL 96* 93* 96* 90*  CO2 35* 37* 38* 36*  GLUCOSE 102* 115* 101* 127*  BUN 24* 25* 23* 37*  CREATININE 0.71 0.84 0.74 1.09*  CALCIUM 9.2 8.8* 8.5* 8.5*  MG  --  1.9  --   --    ------------------------------------------------------------------------------------------------------------------ estimated creatinine clearance is 32.9 mL/min (by C-G formula based on SCr of 1.09 mg/dL). ------------------------------------------------------------------------------------------------------------------ No results for input(s): HGBA1C in the last 72 hours. ------------------------------------------------------------------------------------------------------------------ No results for input(s): CHOL, HDL, LDLCALC, TRIG, CHOLHDL, LDLDIRECT in the last 72 hours. ------------------------------------------------------------------------------------------------------------------ No results for input(s): TSH, T4TOTAL, T3FREE, THYROIDAB in the last 72 hours.  Invalid input(s): FREET3 ------------------------------------------------------------------------------------------------------------------  Recent Labs  07/21/16 2302  VITAMINB12 662  FOLATE 14.4  FERRITIN 709*  TIBC 232*  IRON 105    Coagulation profile  Recent Labs Lab 07/22/16 0722  INR 1.10    No results  for input(s): DDIMER in the last 72 hours.  Cardiac Enzymes  Recent Labs Lab 07/21/16 1848 07/21/16 2302  TROPONINI 0.03* 0.04*   ------------------------------------------------------------------------------------------------------------------ Invalid input(s): POCBNP    Assessment & Plan   Patient's 82 year old who was admitted for anemia due to preop labs now having acute respiratory failure  1. Acute hypoxicAnd hypercarbic respiratory failure Due to acute fluid overload and acute COPD exasperation Much improved Change Lasix to daily Change IV Solu-Medrol to oral prednisone Due to new onset atrial fibrillation I'll check an echocardiogram of the heart  2. A. fib with RVR No history of atrial fibrillation Echo When necessary IV Cardizem Start oral metoprolol Cardiology consult Due to lower GI bleed no aspirin or anticoagulation  2. Anemia due to chronic lower GI bleed Status post transfusion with improvement in her hemoglobin  3. Lung cancer not active diagnoses Outpatient follow-up as previously  4. Prolapse rectum Outpatient follow-up       Code Status Orders        Start     Ordered   07/22/16 1049  Do not attempt resuscitation (DNR)  Continuous    Question Answer Comment  In the event of cardiac or respiratory ARREST Do not call a "code blue"   In the event of cardiac or respiratory ARREST Do not perform Intubation, CPR, defibrillation or ACLS   In the event of cardiac or respiratory ARREST Use medication by any route, position, wound care, and other measures to relive pain and suffering. May use oxygen, suction and manual treatment of airway obstruction as needed for comfort.      07/22/16 1048    Code Status History    Date Active Date Inactive Code Status Order ID Comments User Context   07/21/2016 10:33 PM 07/22/2016 10:48 AM Full Code [id number]  [name], DO ED   07/21/2016 10:33 PM 07/21/2016 10:33 PM DNR [id number]  [name], DO  ED   04/25/2016  3:51 AM 04/26/2016  7:38 PM DNR [id number]  [name], MD Inpatient   04/25/2016  3:29 AM 04/25/2016  3:29 AM Full Code [id number]  [name], MD ED  Advance Directive Documentation   Flowsheet Row Most Recent Value  Type of Advance Directive  Living will, Healthcare Power of Attorney  Pre-existing out of facility DNR order (yellow form or pink MOST form)  No data  "MOST" Form in Place?  No data             DVT Prophylaxis SCDs  Lab Results  Component Value Date   PLT 127 (L) 07/22/2016     Time Spent in minutes  87mn min spent  Greater than 50% of time spent in care coordination and counseling patient regarding the condition and plan of care.   PDustin FlockM.D on 07/24/2016 at 12:39 PM  Between 7am to 6pm - Pager - [phone]  After 6pm go to [email or web address] - password [email or web address]   Office  [phone]

## 2016-07-24 NOTE — Progress Notes (Signed)
Pt requesting home dose of xanax to help her sleep. MD Hugelmeyer notified. Verbal order given for 0.5 mg Xanax at bedtime. Rn to place order, and administer. Will continue to monitor.   Sara Mccoy

## 2016-07-24 NOTE — Evaluation (Signed)
Physical Therapy Evaluation Patient Details Name: Sara Mccoy MRN: [id number] DOB: 11/30/1926 Today's Date: 07/24/2016   History of Present Illness  82 yo female with onset of anemia was admitted and transfused, has aspiration PNA, had respiratory failure initially with rapid response.  Pt has PMHx:  Hospice care, Lung CA, COPD, a-fib, Raynaud's disease, on 2L O2 at home, elevated pulses  Clinical Impression  Pt was seen for evaluation of her mobiltiy and to discuss goals of therapy with her.  Plan is to see if pt can be admitted to SNF which pt agrees to try.  Her daughter stepped out for a meal and was not there to determine if she is in agreement as well, but was supportive of pt doing her therapy.  Will follow acutely to increase LE strength and increase standing control to benefit gait and balance.    Follow Up Recommendations SNF    Equipment Recommendations  None recommended by PT    Recommendations for Other Services Rehab consult     Precautions / Restrictions Precautions Precautions: Fall (telemetry, incontinent of urine) Restrictions Weight Bearing Restrictions: No      Mobility  Bed Mobility Overal bed mobility: Needs Assistance Bed Mobility: Supine to Sit     Supine to sit: Min assist;Mod assist     General bed mobility comments: assist under trunk and to scoot out to edge on pad  Transfers Overall transfer level: Needs assistance Equipment used: Rolling walker (2 wheeled);1 person hand held assist Transfers: Sit to/from [facility] Sit to Stand: Min assist;Mod assist Stand pivot transfers: Min assist;Mod assist       General transfer comment: reminders for hand placement and to increase her control of posture with standing in place  Ambulation/Gait Ambulation/Gait assistance: Min assist Ambulation Distance (Feet): 4 Feet (x 2 ) Assistive device: Rolling walker (2 wheeled);1 person hand held assist Gait Pattern/deviations:  Step-to pattern;Narrow base of support;Trunk flexed;Shuffle;Decreased stride length Gait velocity: reduced Gait velocity interpretation: Below normal speed for age/gender    Stairs            Wheelchair Mobility    Modified Rankin (Stroke Patients Only)       Balance Overall balance assessment: History of Falls;Needs assistance Sitting-balance support: Feet supported Sitting balance-Leahy Scale: Fair     Standing balance support: Bilateral upper extremity supported Standing balance-Leahy Scale: Poor Standing balance comment: limited endurance during efforts to clean up from [facility]                             Pertinent Vitals/Pain Pain Assessment: No/denies pain    Home Living Family/patient expects to be discharged to:: Private residence Living Arrangements: Children Available Help at Discharge: Family;Available PRN/intermittently Type of Home: House Home Access: Stairs to enter Entrance Stairs-Rails: Left Entrance Stairs-Number of Steps: 4 Home Layout: One level Home Equipment: Walker - 4 wheels;Cane - single point;Bedside commode;Shower seat (hurry canes)      Prior Function Level of Independence: Independent with assistive device(s)               Hand Dominance        Extremity/Trunk Assessment   Upper Extremity Assessment: Generalized weakness           Lower Extremity Assessment: Generalized weakness      Cervical / Trunk Assessment: Kyphotic  Communication   Communication: HOH  Cognition Arousal/Alertness: Awake/alert Behavior During Therapy: WFL for tasks assessed/performed Overall Cognitive Status:  Within Functional Limits for tasks assessed                      General Comments General comments (skin integrity, edema, etc.): Pt is up to move to [facility] first then to get to chair, pt is more interactive and making efforts to consume liquids etc with chair positioning. (pulses pre gait were 102 and post gait were  111)    Exercises        Assessment/Plan    PT Assessment Patient needs continued PT services  PT Diagnosis Generalized weakness;Difficulty walking   PT Problem List Decreased strength;Decreased range of motion;Decreased activity tolerance;Decreased balance;Decreased mobility;Decreased coordination;Decreased knowledge of use of DME;Decreased safety awareness;Decreased knowledge of precautions;Cardiopulmonary status limiting activity;Decreased skin integrity  PT Treatment Interventions DME instruction;Gait training;Stair training;Functional mobility training;Therapeutic activities;Therapeutic exercise;Balance training;Neuromuscular re-education;Patient/family education   PT Goals (Current goals can be found in the Care Plan section) Acute Rehab PT Goals Patient Stated Goal: to get up and move, get dried PT Goal Formulation: With patient/family Time For Goal Achievement: 08/07/16 Potential to Achieve Goals: Good    Frequency Min 2X/week   Barriers to discharge Inaccessible home environment;Decreased caregiver support home alone with steps to enter house    Co-evaluation               End of Session Equipment Utilized During Treatment: Gait belt;Oxygen (obtained extender for her line) Activity Tolerance: Patient tolerated treatment well Patient left: in chair;with call bell/phone within reach;with chair alarm set;with nursing/sitter in room Nurse Communication: Mobility status         Time: 6415-8309 PT Time Calculation (min) (ACUTE ONLY): 27 min   Charges:   PT Evaluation $PT Eval Low Complexity: 1 Procedure PT Treatments $Therapeutic Activity: 8-22 mins   PT G Codes:        Ramond Dial August 06, 2016, 5:28 PM    Mee Hives, PT MS Acute Rehab Dept. Number: [place] and [place]

## 2016-07-24 NOTE — Progress Notes (Signed)
Patient noted to go to Afib w RVR rate in the 160s and 170s, VS checked noted to be wnl, Dr Posey Pronto called and notified of the rhythm change. New orders obtained for IV push cardizem '10mg'$  x1, drug given, HR dropped to 90s afib. BP stable as charted. Pt transferred to 2A on telemetry and 2l oxygen [place]. No ss of distress noted during transfer. Report given to nurse at bedside. Daughter [name] called and notified of pt's change of rhythm and plans for transfer.

## 2016-07-24 NOTE — Progress Notes (Signed)
Spoke with dr. Posey Pronto to make aware patient does not have a cardiology consult. Patient currently in new onset afib. Per md will assess patient and place orders as needed

## 2016-07-24 NOTE — Progress Notes (Signed)
8333 Rapid Response called. 8329 Arrived Patient lying in bed alert, atrial fib130s, B/P 119/56. Dr. Romana Juniper previously paged, MD order for 10 mg IV Cardizem, and then transfer to 2A for Cardizem drip. Alma Friendly, patient RN administered IV Cardizem, HR decreased to 110s. 2L n/c in place.  Patient requested me to call her daughter, Rosalyn Charters, and advise she would be transferring to 2A for above. Alma Friendly will call report to 2A nurse and then transfer.

## 2016-07-24 NOTE — Progress Notes (Signed)
[facility] Cardiology  CARDIOLOGY CONSULT NOTE  Patient ID: Sara Mccoy MRN: [id number] DOB/AGE: February 24, 1926 82 y.o.  Admit date: 07/21/2016 Referring Physician Posey Pronto Primary Physician [facility] Primary Cardiologist Minda Faas Reason for Consultation atrial fibrillation  HPI: 82 year old female referred for evaluation of atrial fibrillation with a rapid ventricular rate. The patient has known history of COPD on O2 therapy, history of lung cancer, rectal prolapse, and intermittent anemia. The patient was scheduled for surgery for rectal prolapse, preoperative labs revealed significant anemia and the patient was admitted for transfusions. During hospitalization, the patient's had intermittent hypoxia. She is noted to have atrial fibrillation or rapid ventricular rate. Patient denies chest pain or palpitations. Heart rate currently is 92 bpm.  Review of systems complete and found to be negative unless listed above     Past Medical History:  Diagnosis Date  . Anemia   . COPD (chronic obstructive pulmonary disease) ([place])   . Lung cancer, upper lobe ([place]) 1997   right upper lobe  . Prolapse of intestine   . Raynaud's disease     Past Surgical History:  Procedure Laterality Date  . ABDOMINAL HYSTERECTOMY    . CESAREAN SECTION    . CHOLECYSTECTOMY    . FLEXIBLE SIGMOIDOSCOPY N/A 04/25/2016   Procedure: FLEXIBLE SIGMOIDOSCOPY;  Surgeon: Lucilla Lame, MD;  Location: [facility] ENDOSCOPY;  Service: Endoscopy;  Laterality: N/A;  . JOINT REPLACEMENT    . LUNG SURGERY Right 1997   RUL removed    Prescriptions Prior to Admission  Medication Sig Dispense Refill Last Dose  . Calcium Carb-Cholecalciferol (CALCIUM + D3) 600-200 MG-UNIT TABS Take 1 tablet by mouth 2 (two) times daily. Reported on 06/08/2016   07/21/2016 at Unknown time  . Multiple Vitamin (MULTIVITAMIN WITH MINERALS) TABS tablet Take 1 tablet by mouth daily. Reported on 06/08/2016   07/21/2016 at Unknown time  . oxyCODONE-acetaminophen  (PERCOCET/ROXICET) 5-325 MG tablet Take by mouth 2 (two) times daily as needed for severe pain.   07/21/2016 at Unknown time  . ALPRAZolam (XANAX) 0.5 MG tablet Take 0.5 mg by mouth daily as needed for anxiety.   Taking  . amLODipine (NORVASC) 5 MG tablet Take 5 mg by mouth daily. Reported on 06/08/2016   Not Taking  . Ascorbic Acid (VITAMIN C) 1000 MG tablet Take 1,000 mg by mouth daily.   Taking  . b complex vitamins capsule Take 1 capsule by mouth daily. Reported on 06/08/2016   Not Taking  . cholecalciferol (VITAMIN D) 1000 units tablet Take 1,000 Units by mouth daily.   Taking  . Cyanocobalamin (VITAMIN B-12) 5000 MCG SUBL Place under the tongue. Daily     . docusate sodium (COLACE) 100 MG capsule Take 1 capsule (100 mg total) by mouth 2 (two) times daily. (Patient not taking: Reported on 06/08/2016) 60 capsule 2 Not Taking at Unknown time  . Fluticasone-Salmeterol (ADVAIR) 500-50 MCG/DOSE AEPB Inhale 1 puff into the lungs 2 (two) times daily.   Taking  . ipratropium-albuterol (DUONEB) 0.5-2.5 (3) MG/3ML SOLN Take 3 mLs by nebulization every 6 (six) hours as needed. Reported on 06/08/2016   Taking  . omeprazole (PRILOSEC) 40 MG capsule Take 40 mg by mouth daily.   Taking  . venlafaxine (EFFEXOR) 37.5 MG tablet Take 37.5 mg by mouth 2 (two) times daily. Reported on 06/08/2016   Not Taking  . vitamin E 400 UNIT capsule Take 400 Units by mouth daily.   Taking   Social History   Social History  . Marital status: Widowed  Spouse name: N/A  . Number of children: N/A  . Years of education: N/A   Occupational History  . Not on file.   Social History Main Topics  . Smoking status: Former Smoker    Packs/day: 2.00    Years: 46.00  . Smokeless tobacco: Never Used  . Alcohol use No  . Drug use: No  . Sexual activity: Not on file     Comment: 25 years ago   Other Topics Concern  . Not on file   Social History Narrative  . No narrative on file    Family History  Problem Relation Age of Onset   . Heart attack Son   . Diabetes Son   . Diabetes Mother   . Hypertension Daughter       Review of systems complete and found to be negative unless listed above      PHYSICAL EXAM  General: Well developed, well nourished, in no acute distress HEENT:  Normocephalic and atramatic Neck:  No JVD.  Lungs: Clear bilaterally to auscultation and percussion. Heart: HRRR . Normal S1 and S2 without gallops or murmurs.  Abdomen: Bowel sounds are positive, abdomen soft and non-tender  Msk:  Back normal, normal gait. Normal strength and tone for age. Extremities: No clubbing, cyanosis or edema.   Neuro: Alert and oriented X 3. Psych:  Good affect, responds appropriately  Labs:   Lab Results  Component Value Date   WBC 9.7 07/22/2016   HGB 9.1 (L) 07/22/2016   HCT 26.4 (L) 07/22/2016   MCV 100.1 (H) 07/22/2016   PLT 127 (L) 07/22/2016    Recent Labs Lab 07/23/16 0846  NA 138  K 4.2  CL 90*  CO2 36*  BUN 37*  CREATININE 1.09*  CALCIUM 8.5*  GLUCOSE 127*   Lab Results  Component Value Date   CKTOTAL 51 10/22/2013   CKMB 1.1 10/22/2013   TROPONINI 0.04 (HH) 07/21/2016   No results found for: CHOL No results found for: HDL No results found for: LDLCALC No results found for: TRIG No results found for: CHOLHDL No results found for: LDLDIRECT    Radiology: Dg Chest 1 View  Result Date: 07/22/2016 CLINICAL DATA:  Respiratory failure.  COPD.  History of lung cancer. EXAM: CHEST 1 VIEW COMPARISON:  05/15/2016 and 04/25/2016 FINDINGS: There is new interstitial infiltrate throughout the left lung. New distention of the azygos vein and it new slight cardiomegaly. There is an increased loculated small right pleural effusion. Surgical clips in the right hilum. No acute bone abnormality. Old deformity of the proximal left humerus. IMPRESSION: Findings consistent with interstitial pulmonary edema with new pulmonary vascular congestion, cardiomegaly, and increased small right pleural  effusion Electronically Signed   By: Lorriane Shire M.D.   On: 07/22/2016 11:39   US Pelvis Complete  Result Date: 07/14/2016 CLINICAL DATA:  Left ovarian cyst. EXAM: TRANSABDOMINAL ULTRASOUND OF PELVIS TECHNIQUE: Transabdominal ultrasound examination of the pelvis was performed including evaluation of the uterus, ovaries, adnexal regions, and pelvic cul-de-sac. COMPARISON:  Abdominal CT 04/07/2016. CT 04/11/2002 not available for review FINDINGS: Uterus Surgically absent Right ovary Not visualized, normal on previous CT. Left ovary In the same location as ovarian cyst seen on previous abdominal CT there is a similarly sized 32 x 24 x 23 mm simple appearing cyst. No internal nodularity or septation is noted. Other findings:  No abnormal free fluid. IMPRESSION: 32 x 24 x 23 mm left pelvic cyst correlating with ovarian cyst on May 2017 abdominal  CT. The cyst has a simple, benign appearance and could be followed up in 1 year with ultrasound - if clinically appropriate. Electronically Signed   By: Monte Fantasia M.D.   On: 07/14/2016 13:52    EKG: Atrial fibrillation with heart rate 92 bpm  ASSESSMENT AND PLAN:   1. Paroxysmal atrial fibrillation, with rapid ventricular rate, currently controlled on metoprolol, chads Vasc of 4, in the setting of anemia and GI bleed 2. Rectal prolapse awaiting surgery 3. Anemia, stabilized following transfusion 4. O2 dependent COPD  Recommendations  1. Agree with overall current therapy 2. Continue metoprolol, up titrate as needed to control ventricular rate 3. Defer chronic anticoagulation and setting of anemia and GI bleed 4. Review 2-D echocardiogram  Signed: Cailee Blanke MD,PhD, [facility] 07/24/2016, 12:42 PM

## 2016-07-24 NOTE — Progress Notes (Signed)
MD notified of pt continued irregular heart rate and rhythm. Orders placed.

## 2016-07-24 NOTE — Progress Notes (Signed)
Patient transferred to room, report was given in room. No c/o of pain no sob. Telemetry verified, currently afib 101. o2 at 2l 96% lungs clear/diminished. Will continue to monitor closely

## 2016-07-25 ENCOUNTER — Inpatient Hospital Stay: Payer: PPO

## 2016-07-25 LAB — BASIC METABOLIC PANEL
Anion gap: 7 (ref 5–15)
BUN: 42 mg/dL — AB (ref 6–20)
CHLORIDE: 90 mmol/L — AB (ref 101–111)
CO2: 44 mmol/L — AB (ref 22–32)
CREATININE: 0.98 mg/dL (ref 0.44–1.00)
Calcium: 8 mg/dL — ABNORMAL LOW (ref 8.9–10.3)
GFR calc Af Amer: 58 mL/min — ABNORMAL LOW (ref >60)
GFR calc non Af Amer: 50 mL/min — ABNORMAL LOW (ref >60)
Glucose, Bld: 106 mg/dL — ABNORMAL HIGH (ref 65–99)
Potassium: 4 mmol/L (ref 3.5–5.1)
SODIUM: 141 mmol/L (ref 135–145)

## 2016-07-25 LAB — CBC
HEMATOCRIT: 27.6 % — AB (ref 35.0–47.0)
HEMOGLOBIN: 9.5 g/dL — AB (ref 12.0–16.0)
MCH: 35.3 pg — AB (ref 26.0–34.0)
MCHC: 34.4 g/dL (ref 32.0–36.0)
MCV: 102.7 fL — ABNORMAL HIGH (ref 80.0–100.0)
Platelets: 98 10*3/uL — ABNORMAL LOW (ref 150–440)
RBC: 2.68 MIL/uL — ABNORMAL LOW (ref 3.80–5.20)
RDW: 23 % — ABNORMAL HIGH (ref 11.5–14.5)
WBC: 17.5 10*3/uL — ABNORMAL HIGH (ref 3.6–11.0)

## 2016-07-25 LAB — ECHOCARDIOGRAM COMPLETE
Height: 61 in
WEIGHTICAEL: 2616 [oz_av]

## 2016-07-25 MED ORDER — METOPROLOL TARTRATE 50 MG PO TABS
50.0000 mg | ORAL_TABLET | Freq: Two times a day (BID) | ORAL | Status: DC
  Administered 2016-07-25 – 2016-07-27 (×5): 50 mg via ORAL
  Filled 2016-07-25 (×5): qty 1

## 2016-07-25 MED ORDER — FUROSEMIDE 20 MG PO TABS
20.0000 mg | ORAL_TABLET | Freq: Every day | ORAL | Status: DC
  Administered 2016-07-26 – 2016-07-27 (×2): 20 mg via ORAL
  Filled 2016-07-25 (×2): qty 1

## 2016-07-25 NOTE — Progress Notes (Signed)
[facility] Cardiology  SUBJECTIVE: I don't have chest pain   Vitals:   07/24/16 2023 07/25/16 0405 07/25/16 0726 07/25/16 0800  BP:  (!) 146/77  139/76  Pulse:  98  (!) 112  Resp:  18    Temp:  98.5 F (36.9 C)  98.2 F (36.8 C)  TempSrc:  Oral  Oral  SpO2: 94% 96% 95% 100%  Weight:      Height:         Intake/Output Summary (Last 24 hours) at 07/25/16 0920 Last data filed at 07/25/16 0913  Gross per 24 hour  Intake              340 ml  Output              450 ml  Net             -110 ml      PHYSICAL EXAM  General: Well developed, well nourished, in no acute distress HEENT:  Normocephalic and atramatic Neck:  No JVD.  Lungs: Clear bilaterally to auscultation and percussion. Heart: Irregularly irregular rhythm. Normal S1 and S2 without gallops or murmurs.  Abdomen: Bowel sounds are positive, abdomen soft and non-tender  Msk:  Back normal, normal gait. Normal strength and tone for age. Extremities: No clubbing, cyanosis or edema.   Neuro: Alert and oriented X 3. Psych:  Good affect, responds appropriately   LABS: Basic Metabolic Panel:  Recent Labs  07/23/16 0846 07/25/16 0648  NA 138 141  K 4.2 4.0  CL 90* 90*  CO2 36* 44*  GLUCOSE 127* 106*  BUN 37* 42*  CREATININE 1.09* 0.98  CALCIUM 8.5* 8.0*   Liver Function Tests: No results for input(s): AST, ALT, ALKPHOS, BILITOT, PROT, ALBUMIN in the last 72 hours. No results for input(s): LIPASE, AMYLASE in the last 72 hours. CBC:  Recent Labs  07/25/16 0648  WBC 17.5*  HGB 9.5*  HCT 27.6*  MCV 102.7*  PLT 98*   Cardiac Enzymes: No results for input(s): CKTOTAL, CKMB, CKMBINDEX, TROPONINI in the last 72 hours. BNP: Invalid input(s): POCBNP D-Dimer: No results for input(s): DDIMER in the last 72 hours. Hemoglobin A1C: No results for input(s): HGBA1C in the last 72 hours. Fasting Lipid Panel: No results for input(s): CHOL, HDL, LDLCALC, TRIG, CHOLHDL, LDLDIRECT in the last 72 hours. Thyroid Function  Tests:  Recent Labs  07/23/16 0846  TSH 2.386   Anemia Panel: No results for input(s): VITAMINB12, FOLATE, FERRITIN, TIBC, IRON, RETICCTPCT in the last 72 hours.  Dg Chest 1 View  Result Date: 07/25/2016 CLINICAL DATA:  Shortness of breath.  Subsequent encounter. EXAM: CHEST 1 VIEW COMPARISON:  07/22/2016 FINDINGS: Since prior study, there has been significant improvement. The bilateral irregular interstitial and hazy airspace opacities have significantly improved. Mild persistent opacity is noted in the lung bases, left greater than right. Probable small pleural effusions, left greater than right. No pneumothorax. Surgical vascular clips are superimposed over the right hilum, stable. Cardiac silhouette is normal in size. IMPRESSION: 1. Significant improvement in lung aeration consistent with improved congestive heart failure. No new abnormalities. Electronically Signed   By: Lajean Manes M.D.   On: 07/25/2016 07:27     Echo pending  TELEMETRY: Atrial fibrillation:  ASSESSMENT AND PLAN:  Active Problems:   Symptomatic anemia   Acute respiratory failure ([place])    1. Atrial fibrillation, rate controlled on current medications, chads Vasc 4, not on chronic anticoagulation, in the setting of anemia and GI bleed  2. Rectal prolapse, with bleeding, awaiting surgery 3. Anemia, stabilized following transfusion 4. COPD, O2 dependent  Recommendations  1. Agree with current therapy 2. Continue metoprolol for rate control, up titrate as needed 3. Defer chronic anticoagulation in the setting of anemia and rectal bleeding 4. Review 2-D echocardiogram   Isaias Cowman, MD, PhD, [facility] 07/25/2016 9:20 AM

## 2016-07-25 NOTE — NC FL2 (Signed)
[place] LEVEL OF CARE SCREENING TOOL     IDENTIFICATION  Patient Name: Sara Mccoy [facility] Birthdate: 1926/02/11 Sex: female Admission Date (Current Location): 07/21/2016  [place] and [place] Number:  Engineering geologist and Address:  [facility], [address] [place], [place] [address]      Provider Number: [id number]  Attending Physician Name and Address:  [name], MD  Relative Name and Phone Number:       Current Level of Care: Hospital Recommended Level of Care: [place] Prior Approval Number:    Date Approved/Denied:   PASRR Number:  [id number])  Discharge Plan: SNF    Current Diagnoses: Patient Active Problem List   Diagnosis Date Noted  . Acute respiratory failure ([place]) 07/22/2016  . Symptomatic anemia 07/21/2016  . GI bleed 04/25/2016  . Abdominal mass   . Absolute anemia   . Blood in stool   . Diverticulosis of large intestine without diverticulitis     Orientation RESPIRATION BLADDER Height & Weight     Self, Time, Situation, Place  O2 (2 Liters Oxygen ) Continent Weight: 163 lb 8 oz (74.2 kg) Height:  '5\' 1"'$  (154.9 cm)  BEHAVIORAL SYMPTOMS/MOOD NEUROLOGICAL BOWEL NUTRITION STATUS   (none )  (none ) Continent Diet (Diet: Heart Healthy )  AMBULATORY STATUS COMMUNICATION OF NEEDS Skin   Extensive Assist Verbally Normal                       Personal Care Assistance Level of Assistance  Bathing, Feeding, Dressing Bathing Assistance: Limited assistance Feeding assistance: Independent Dressing Assistance: Limited assistance     Functional Limitations Info  Sight, Hearing, Speech Sight Info: Adequate Hearing Info: Adequate Speech Info: Adequate    SPECIAL CARE FACTORS FREQUENCY  PT (By licensed PT), OT (By licensed OT)     PT Frequency:  (5) OT Frequency:  (5)            Contractures      Additional Factors Info  Code Status, Allergies, Psychotropic Code  Status Info:  (Full Code. ) Allergies Info:  (No Known Allergies. ) Psychotropic Info:  (Xanax)         Current Medications (07/25/2016):  This is the current hospital active medication list Current Facility-Administered Medications  Medication Dose Route Frequency Provider Last Rate Last Dose  . acetaminophen (TYLENOL) tablet 650 mg  650 mg Oral Q6H PRN [name], DO       Or  . acetaminophen (TYLENOL) suppository 650 mg  650 mg Rectal Q6H PRN [name], DO   650 mg at 07/22/16 1036  . ALPRAZolam Duanne Moron) tablet 0.5 mg  0.5 mg Oral QHS [name], DO   0.5 mg at 07/24/16 2113  . antiseptic oral rinse (CPC / CETYLPYRIDINIUM CHLORIDE 0.05%) solution 7 mL  7 mL Mouth Rinse q12n4p [name], MD   7 mL at 07/23/16 1656  . diltiazem (CARDIZEM) injection 10 mg  10 mg Intravenous Q6H PRN [name], MD      . furosemide (LASIX) injection 40 mg  40 mg Intravenous Daily [name], MD   40 mg at 07/25/16 0932  . ipratropium-albuterol (DUONEB) 0.5-2.5 (3) MG/3ML nebulizer solution 3 mL  3 mL Nebulization Q6H [name], MD   3 mL at 07/25/16 0725  . metoprolol (LOPRESSOR) tablet 50 mg  50 mg Oral BID [name], MD   50 mg at 07/25/16 0932  . mometasone-formoterol (DULERA) 200-5 MCG/ACT  inhaler 2 puff  2 puff Inhalation BID [name], DO   2 puff at 07/25/16 0900  . ondansetron (ZOFRAN) tablet 4 mg  4 mg Oral Q6H PRN [name], DO       Or  . ondansetron (ZOFRAN) injection 4 mg  4 mg Intravenous Q6H PRN [name], DO   4 mg at 07/22/16 1013  . oxyCODONE-acetaminophen (PERCOCET/ROXICET) 5-325 MG per tablet 1-2 tablet  1-2 tablet Oral Q8H PRN Epifanio Lesches, MD   2 tablet at 07/23/16 0553  . predniSONE (DELTASONE) tablet 50 mg  50 mg Oral Q breakfast [name], MD   50 mg at 07/25/16 0900  . sodium chloride flush (NS) 0.9 % injection 3 mL  3 mL Intravenous Q12H [name], DO   3 mL at 07/24/16 2040  . venlafaxine  (EFFEXOR) tablet 37.5 mg  37.5 mg Oral BID [name], MD   37.5 mg at 07/25/16 0932     Discharge Medications: Please see discharge summary for a list of discharge medications.  Relevant Imaging Results:  Relevant Lab Results:   Additional Information  (SSN: [id number])  Doratha Mcswain, Veronia Beets, LCSW

## 2016-07-25 NOTE — Progress Notes (Signed)
[place] at [facility]                                                                                                                                                                                            Patient Demographics   Sara Mccoy, is a 82 y.o. female, DOB - [birth year], ACZ:[id number]  Admit date - 07/21/2016   Admitting Physician Gladstone Lighter, MD  Outpatient Primary MD for the patient is HANDE,VISHWANATH, MD   LOS - 3  Subjective:   HR well controlled. Off cardizem drip Has chronic diarrhea   Review of Systems:    CONSTITUTIONAL: No documented fever. No fatigue, weakness. No weight gain, no weight loss.  EYES: No blurry or double vision.  ENT: No tinnitus. No postnasal drip. No redness of the oropharynx.  RESPIRATORY: No cough, no wheeze, no hemoptysis. Positive  CARDIOVASCULAR: No chest pain. No orthopnea. No palpitations. No syncope.  GASTROINTESTINAL: No nausea, no vomiting or diarrhea. No abdominal pain. No melena or hematochezia.  GENITOURINARY:  No urgency. No frequency. No dysuria. No hematuria. No obstructive symptoms. No discharge. No pain. No significant abnormal bleeding ENDOCRINE: No polyuria or nocturia. No heat or cold intolerance.  HEMATOLOGY: No anemia. No bruising. No bleeding. No purpura. No petechiae INTEGUMENTARY: No rashes. No lesions.  MUSCULOSKELETAL: No arthritis. No swelling. No gout.  NEUROLOGIC: No numbness, tingling, or ataxia. No seizure-type activity.  PSYCHIATRIC: No anxiety. No insomnia. No ADD.     Vitals:   Vitals:   07/25/16 0405 07/25/16 0726 07/25/16 0800 07/25/16 1124  BP: (!) 146/77  139/76 (!) 123/57  Pulse: 98  (!) 112 (!) 58  Resp: 18   18  Temp: 98.5 F (36.9 C)  98.2 F (36.8 C) 98.6 F (37 C)  TempSrc: Oral  Oral Oral  SpO2: 96% 95% 100% 97%  Weight:      Height:        Wt Readings from Last 3 Encounters:  07/24/16 74.2 kg (163 lb 8 oz)  06/08/16 64.4 kg  (142 lb)  05/15/16 64.4 kg (142 lb)     Intake/Output Summary (Last 24 hours) at 07/25/16 1325 Last data filed at 07/25/16 1320  Gross per 24 hour  Intake              360 ml  Output              450 ml  Net              -90 ml    Physical Exam:   GENERAL:Patient doing better chronically ill-appearing HEAD, EYES, EARS, NOSE  AND THROAT: Atraumatic, normocephalic. . Pupils equal and reactive to light. Sclerae anicteric. No conjunctival injection. No oro-pharyngeal erythema.  NECK: Supple. There is no jugular venous distention. No bruits, no lymphadenopathy, no thyromegaly.  HEART: Regular rate and rhythm,. No murmurs, no rubs, no clicks.  LUNGS: Diminished breath sounds bilaterally No rales or rhonchi. No wheezes.  ABDOMEN: Soft, flat, nontender, nondistended. Has good bowel sounds. No hepatosplenomegaly appreciated.  EXTREMITIES: No evidence of any cyanosis, clubbing, or peripheral edema.  +2 pedal and radial pulses bilaterally.  NEUROLOGIC: Poorly responsive  SKIN: Moist and warm with no rashes appreciated.  Psych: Not responsive  LN: No inguinal LN enlargement    Antibiotics   Anti-infectives    Start     Dose/Rate Route Frequency Ordered Stop   07/22/16 1200  Ampicillin-Sulbactam (UNASYN) 3 g in sodium chloride 0.9 % 100 mL IVPB  Status:  Discontinued     3 g 100 mL/hr over 60 Minutes Intravenous Every 6 hours 07/22/16 1120 07/24/16 1246      Medications   Scheduled Meds: . ALPRAZolam  0.5 mg Oral QHS  . antiseptic oral rinse  7 mL Mouth Rinse q12n4p  . furosemide  40 mg Intravenous Daily  . ipratropium-albuterol  3 mL Nebulization Q6H  . metoprolol tartrate  50 mg Oral BID  . mometasone-formoterol  2 puff Inhalation BID  . predniSONE  50 mg Oral Q breakfast  . sodium chloride flush  3 mL Intravenous Q12H  . venlafaxine  37.5 mg Oral BID   Continuous Infusions:  PRN Meds:.acetaminophen **OR** acetaminophen, diltiazem, ondansetron **OR** ondansetron (ZOFRAN) IV,  oxyCODONE-acetaminophen   Data Review:   Micro Results Recent Results (from the past 240 hour(s))  MRSA PCR Screening     Status: None   Collection Time: 07/22/16 12:23 PM  Result Value Ref Range Status   MRSA by PCR NEGATIVE NEGATIVE Final    Comment:        The GeneXpert MRSA Assay (FDA approved for NASAL specimens only), is one component of a comprehensive MRSA colonization surveillance program. It is not intended to diagnose MRSA infection nor to guide or monitor treatment for MRSA infections.     Radiology Reports Dg Chest 1 View  Result Date: 07/25/2016 CLINICAL DATA:  Shortness of breath.  Subsequent encounter. EXAM: CHEST 1 VIEW COMPARISON:  07/22/2016 FINDINGS: Since prior study, there has been significant improvement. The bilateral irregular interstitial and hazy airspace opacities have significantly improved. Mild persistent opacity is noted in the lung bases, left greater than right. Probable small pleural effusions, left greater than right. No pneumothorax. Surgical vascular clips are superimposed over the right hilum, stable. Cardiac silhouette is normal in size. IMPRESSION: 1. Significant improvement in lung aeration consistent with improved congestive heart failure. No new abnormalities. Electronically Signed   By: Lajean Manes M.D.   On: 07/25/2016 07:27   Dg Chest 1 View  Result Date: 07/22/2016 CLINICAL DATA:  Respiratory failure.  COPD.  History of lung cancer. EXAM: CHEST 1 VIEW COMPARISON:  05/15/2016 and 04/25/2016 FINDINGS: There is new interstitial infiltrate throughout the left lung. New distention of the azygos vein and it new slight cardiomegaly. There is an increased loculated small right pleural effusion. Surgical clips in the right hilum. No acute bone abnormality. Old deformity of the proximal left humerus. IMPRESSION: Findings consistent with interstitial pulmonary edema with new pulmonary vascular congestion, cardiomegaly, and increased small right  pleural effusion Electronically Signed   By: Lorriane Shire M.D.   On:  07/22/2016 11:39   US Pelvis Complete  Result Date: 07/14/2016 CLINICAL DATA:  Left ovarian cyst. EXAM: TRANSABDOMINAL ULTRASOUND OF PELVIS TECHNIQUE: Transabdominal ultrasound examination of the pelvis was performed including evaluation of the uterus, ovaries, adnexal regions, and pelvic cul-de-sac. COMPARISON:  Abdominal CT 04/07/2016. CT 04/11/2002 not available for review FINDINGS: Uterus Surgically absent Right ovary Not visualized, normal on previous CT. Left ovary In the same location as ovarian cyst seen on previous abdominal CT there is a similarly sized 32 x 24 x 23 mm simple appearing cyst. No internal nodularity or septation is noted. Other findings:  No abnormal free fluid. IMPRESSION: 32 x 24 x 23 mm left pelvic cyst correlating with ovarian cyst on May 2017 abdominal CT. The cyst has a simple, benign appearance and could be followed up in 1 year with ultrasound - if clinically appropriate. Electronically Signed   By: Monte Fantasia M.D.   On: 07/14/2016 13:52     CBC  Recent Labs Lab 07/21/16 1848 07/21/16 2302 07/22/16 0722 07/25/16 0648  WBC 8.0 8.1 9.7 17.5*  HGB 6.2* 6.6* 9.1* 9.5*  HCT 18.8* 20.2* 26.4* 27.6*  PLT 109* 132* 127* 98*  MCV 111.4* 110.9* 100.1* 102.7*  MCH 36.6* 36.4* 34.3* 35.3*  MCHC 32.9 32.8 34.2 34.4  RDW 18.9* 19.4* 25.0* 23.0*    Chemistries   Recent Labs Lab 07/21/16 1848 07/21/16 2302 07/22/16 0722 07/23/16 0846 07/25/16 0648  NA 137 137 135 138 141  K 4.2 4.1 4.5 4.2 4.0  CL 96* 93* 96* 90* 90*  CO2 35* 37* 38* 36* 44*  GLUCOSE 102* 115* 101* 127* 106*  BUN 24* 25* 23* 37* 42*  CREATININE 0.71 0.84 0.74 1.09* 0.98  CALCIUM 9.2 8.8* 8.5* 8.5* 8.0*  MG  --  1.9  --   --   --    ------------------------------------------------------------------------------------------------------------------ estimated creatinine clearance is 36.6 mL/min (by C-G formula based on  SCr of 0.98 mg/dL). ------------------------------------------------------------------------------------------------------------------ No results for input(s): HGBA1C in the last 72 hours. ------------------------------------------------------------------------------------------------------------------ No results for input(s): CHOL, HDL, LDLCALC, TRIG, CHOLHDL, LDLDIRECT in the last 72 hours. ------------------------------------------------------------------------------------------------------------------  Recent Labs  07/23/16 0846  TSH 2.386   ------------------------------------------------------------------------------------------------------------------ No results for input(s): VITAMINB12, FOLATE, FERRITIN, TIBC, IRON, RETICCTPCT in the last 72 hours.  Coagulation profile  Recent Labs Lab 07/22/16 0722  INR 1.10    No results for input(s): DDIMER in the last 72 hours.  Cardiac Enzymes  Recent Labs Lab 07/21/16 1848 07/21/16 2302  TROPONINI 0.03* 0.04*   ------------------------------------------------------------------------------------------------------------------ Invalid input(s): POCBNP    Assessment & Plan   Patient's 82 year old who was admitted for anemia due to preop labs now having acute respiratory failure  1. Acute hypoxic and hypercarbic respiratory failure Due to acute fluid overload and acute COPD exacerbation Much improved Changed Lasix to daily oral prednisone  2. A. fib with RVR No history of atrial fibrillation When necessary IV Cardizem Started oral metoprolol Cardiology consulted Due to lower GI bleed no aspirin or anticoagulation  2. Anemia due to chronic lower GI bleed Status post transfusion with improvement in her hemoglobin  3. Lung cancer not active diagnoses Outpatient follow-up as previously  4. Prolapse rectum Outpatient follow-up      Code Status Orders        Start     Ordered   07/22/16 1856  Do not attempt  resuscitation (DNR)  Continuous    Question Answer Comment  In the event of cardiac or respiratory ARREST Do not call  a "code blue"   In the event of cardiac or respiratory ARREST Do not perform Intubation, CPR, defibrillation or ACLS   In the event of cardiac or respiratory ARREST Use medication by any route, position, wound care, and other measures to relive pain and suffering. May use oxygen, suction and manual treatment of airway obstruction as needed for comfort.      07/22/16 1048    Code Status History    Date Active Date Inactive Code Status Order ID Comments User Context   07/21/2016 10:33 PM 07/22/2016 10:48 AM Full Code [id number]  [name], DO ED   07/21/2016 10:33 PM 07/21/2016 10:33 PM DNR [id number]  [name], DO ED   04/25/2016  3:51 AM 04/26/2016  7:38 PM DNR [id number]  [name], MD Inpatient   04/25/2016  3:29 AM 04/25/2016  3:29 AM Full Code [id number]  [name], MD ED    Advance Directive Documentation   Flowsheet Row Most Recent Value  Type of Advance Directive  Living will, Healthcare Power of Attorney  Pre-existing out of facility DNR order (yellow form or pink MOST form)  No data  "MOST" Form in Place?  No data             DVT Prophylaxis SCDs  Lab Results  Component Value Date   PLT 98 (L) 07/25/2016   Time Spent in minutes  30 min min spent  [name] R M.D on 07/25/2016 at 1:25 PM  Between 7am to 6pm - Pager - [phone]  After 6pm go to [email or web address] - password [email or web address] [facility]   Office  [phone]

## 2016-07-26 LAB — CBC WITH DIFFERENTIAL/PLATELET
Basophils Absolute: 0.1 10*3/uL (ref 0–0.1)
EOS ABS: 0.1 10*3/uL (ref 0–0.7)
HEMATOCRIT: 29.1 % — AB (ref 35.0–47.0)
Hemoglobin: 9.8 g/dL — ABNORMAL LOW (ref 12.0–16.0)
Lymphocytes Relative: 14 %
Lymphs Abs: 1.8 10*3/uL (ref 1.0–3.6)
MCH: 34.1 pg — AB (ref 26.0–34.0)
MCHC: 33.7 g/dL (ref 32.0–36.0)
MCV: 101.4 fL — ABNORMAL HIGH (ref 80.0–100.0)
MONO ABS: 1.5 10*3/uL — AB (ref 0.2–0.9)
NEUTROS ABS: 9.4 10*3/uL — AB (ref 1.4–6.5)
Neutrophils Relative %: 72 %
PLATELETS: 98 10*3/uL — AB (ref 150–440)
RBC: 2.87 MIL/uL — ABNORMAL LOW (ref 3.80–5.20)
RDW: 22.4 % — ABNORMAL HIGH (ref 11.5–14.5)
WBC: 12.8 10*3/uL — ABNORMAL HIGH (ref 3.6–11.0)

## 2016-07-26 MED ORDER — IPRATROPIUM-ALBUTEROL 0.5-2.5 (3) MG/3ML IN SOLN
3.0000 mL | Freq: Three times a day (TID) | RESPIRATORY_TRACT | Status: DC
  Administered 2016-07-26 – 2016-07-27 (×5): 3 mL via RESPIRATORY_TRACT
  Filled 2016-07-26 (×5): qty 3

## 2016-07-26 MED ORDER — PREDNISONE 50 MG PO TABS
50.0000 mg | ORAL_TABLET | Freq: Every day | ORAL | Status: DC
  Administered 2016-07-27: 50 mg via ORAL
  Filled 2016-07-26: qty 1

## 2016-07-26 NOTE — Progress Notes (Signed)
[facility]  SUBJECTIVE: I'm feeling better   Vitals:   07/25/16 1928 07/25/16 2041 07/26/16 0622 07/26/16 0801  BP: 111/72  (!) 128/59 133/65  Pulse: 81  99 77  Resp: '18  16 16  '$ Temp: 97.6 F (36.4 C)  98.2 F (36.8 C) 98 F (36.7 C)  TempSrc: Oral  Oral Oral  SpO2: 94% 97% 99% 97%  Weight:   69 kg (152 lb 3.2 oz)   Height:         Intake/Output Summary (Last 24 hours) at 07/26/16 0947 Last data filed at 07/26/16 0500  Gross per 24 hour  Intake              960 ml  Output              650 ml  Net              310 ml      PHYSICAL EXAM  General: Well developed, well nourished, in no acute distress HEENT:  Normocephalic and atramatic Neck:  No JVD.  Lungs: Clear bilaterally to auscultation and percussion. Heart: HRRR . Normal S1 and S2 without gallops or murmurs.  Abdomen: Bowel sounds are positive, abdomen soft and non-tender  Msk:  Back normal, normal gait. Normal strength and tone for age. Extremities: No clubbing, cyanosis or edema.   Neuro: Alert and oriented X 3. Psych:  Good affect, responds appropriately   LABS: Basic Metabolic Panel:  Recent Labs  07/25/16 0648  NA 141  K 4.0  CL 90*  CO2 44*  GLUCOSE 106*  BUN 42*  CREATININE 0.98  CALCIUM 8.0*   Liver Function Tests: No results for input(s): AST, ALT, ALKPHOS, BILITOT, PROT, ALBUMIN in the last 72 hours. No results for input(s): LIPASE, AMYLASE in the last 72 hours. CBC:  Recent Labs  07/25/16 0648 07/26/16 0825  WBC 17.5* 12.8*  NEUTROABS  --  9.4*  HGB 9.5* 9.8*  HCT 27.6* 29.1*  MCV 102.7* 101.4*  PLT 98* 98*   Cardiac Enzymes: No results for input(s): CKTOTAL, CKMB, CKMBINDEX, TROPONINI in the last 72 hours. BNP: Invalid input(s): POCBNP D-Dimer: No results for input(s): DDIMER in the last 72 hours. Hemoglobin A1C: No results for input(s): HGBA1C in the last 72 hours. Fasting Lipid Panel: No results for input(s): CHOL, HDL, LDLCALC, TRIG, CHOLHDL, LDLDIRECT in the  last 72 hours. Thyroid Function Tests: No results for input(s): TSH, T4TOTAL, T3FREE, THYROIDAB in the last 72 hours.  Invalid input(s): FREET3 Anemia Panel: No results for input(s): VITAMINB12, FOLATE, FERRITIN, TIBC, IRON, RETICCTPCT in the last 72 hours.  Dg Chest 1 View  Result Date: 07/25/2016 CLINICAL DATA:  Shortness of breath.  Subsequent encounter. EXAM: CHEST 1 VIEW COMPARISON:  07/22/2016 FINDINGS: Since prior study, there has been significant improvement. The bilateral irregular interstitial and hazy airspace opacities have significantly improved. Mild persistent opacity is noted in the lung bases, left greater than right. Probable small pleural effusions, left greater than right. No pneumothorax. Surgical vascular clips are superimposed over the right hilum, stable. Cardiac silhouette is normal in size. IMPRESSION: 1. Significant improvement in lung aeration consistent with improved congestive heart failure. No new abnormalities. Electronically Signed   By: Lajean Manes M.D.   On: 07/25/2016 07:27     Echo normal left ventricular function, LV ejection fraction 55%  TELEMETRY: Atrial fibrillation:  ASSESSMENT AND PLAN:  Active Problems:   Symptomatic anemia   Acute respiratory failure ([place])    1. Atrial  fibrillation, rate controlled on current medications, chads Vascor 4, not on chronic anticoagulation due to anemia, ongoing GI bleed secondary to rectal prolapse awaiting surgery 2. Rectal prolapse, surgery delayed 3. Anemia, improved after transfusion 4. COPD, O2 dependent  Recommendations  1. Agree with current therapy 2. Continue metoprolol for rate control 3. Defer chronic anticoagulation at this time  Signed off for now, please call if any questions   Kya Mayfield, MD, PhD, [facility] 07/26/2016 9:47 AM

## 2016-07-26 NOTE — Progress Notes (Signed)
[place] [facility]                                                                                                                                                                                            Patient Demographics   Sara Mccoy, is a 82 y.o. female, DOB - Jun 20, 1926, WCH:[id number]  Admit date - 07/21/2016   Admitting Physician Gladstone Lighter, MD  Outpatient Primary MD for the patient is Tracie Harrier, MD   LOS - 4  Subjective:   Diarrhea has resolved. No abdominal pain. No blood in stool.   Review of Systems:    CONSTITUTIONAL: No documented fever. No fatigue, weakness. No weight gain, no weight loss.  EYES: No blurry or double vision.  ENT: No tinnitus. No postnasal drip. No redness of the oropharynx.  RESPIRATORY: No cough, no wheeze, no hemoptysis. Positive  CARDIOVASCULAR: No chest pain. No orthopnea. No palpitations. No syncope.  GASTROINTESTINAL: No nausea, no vomiting or diarrhea. No abdominal pain. No melena or hematochezia.  GENITOURINARY:  No urgency. No frequency. No dysuria. No hematuria. No obstructive symptoms. No discharge. No pain. No significant abnormal bleeding ENDOCRINE: No polyuria or nocturia. No heat or cold intolerance.  HEMATOLOGY: No anemia. No bruising. No bleeding. No purpura. No petechiae INTEGUMENTARY: No rashes. No lesions.  MUSCULOSKELETAL: No arthritis. No swelling. No gout.  NEUROLOGIC: No numbness, tingling, or ataxia. No seizure-type activity.  PSYCHIATRIC: No anxiety. No insomnia. No ADD.     Vitals:   Vitals:   07/25/16 1928 07/25/16 2041 07/26/16 0622 07/26/16 0801  BP: 111/72  (!) 128/59 133/65  Pulse: 81  99 77  Resp: '18  16 16  '$ Temp: 97.6 F (36.4 C)  98.2 F (36.8 C) 98 F (36.7 C)  TempSrc: Oral  Oral Oral  SpO2: 94% 97% 99% 97%  Weight:   69 kg (152 lb 3.2 oz)   Height:        Wt Readings from Last 3 Encounters:  07/26/16 69 kg (152 lb 3.2 oz)  06/08/16  64.4 kg (142 lb)  05/15/16 64.4 kg (142 lb)     Intake/Output Summary (Last 24 hours) at 07/26/16 1155 Last data filed at 07/26/16 0500  Gross per 24 hour  Intake              600 ml  Output              650 ml  Net              -50 ml    Physical Exam:   GENERAL:Patient doing better chronically ill-appearing HEAD,  EYES, EARS, NOSE AND THROAT: Atraumatic, normocephalic. . Pupils equal and reactive to light. Sclerae anicteric. No conjunctival injection. No oro-pharyngeal erythema.  NECK: Supple. There is no jugular venous distention. No bruits, no lymphadenopathy, no thyromegaly.  HEART: Regular rate and rhythm,. No murmurs, no rubs, no clicks.  LUNGS: Diminished breath sounds bilaterally No rales or rhonchi. No wheezes.  ABDOMEN: Soft, flat, nontender, nondistended. Has good bowel sounds. No hepatosplenomegaly appreciated.  EXTREMITIES: No evidence of any cyanosis, clubbing, or peripheral edema.  +2 pedal and radial pulses bilaterally.  NEUROLOGIC: Poorly responsive  SKIN: Moist and warm with no rashes appreciated.  Psych: Not responsive  LN: No inguinal LN enlargement    Antibiotics   Anti-infectives    Start     Dose/Rate Route Frequency Ordered Stop   07/22/16 1200  Ampicillin-Sulbactam (UNASYN) 3 g in sodium chloride 0.9 % 100 mL IVPB  Status:  Discontinued     3 g 100 mL/hr over 60 Minutes Intravenous Every 6 hours 07/22/16 1120 07/24/16 1246      Medications   Scheduled Meds: . ALPRAZolam  0.5 mg Oral QHS  . antiseptic oral rinse  7 mL Mouth Rinse q12n4p  . furosemide  20 mg Oral Daily  . ipratropium-albuterol  3 mL Nebulization TID  . metoprolol tartrate  50 mg Oral BID  . mometasone-formoterol  2 puff Inhalation BID  . predniSONE  50 mg Oral Q breakfast  . sodium chloride flush  3 mL Intravenous Q12H  . venlafaxine  37.5 mg Oral BID   Continuous Infusions:  PRN Meds:.acetaminophen **OR** acetaminophen, diltiazem, ondansetron **OR** ondansetron (ZOFRAN) IV,  oxyCODONE-acetaminophen   Data Review:   Micro Results Recent Results (from the past 240 hour(s))  MRSA PCR Screening     Status: None   Collection Time: 07/22/16 12:23 PM  Result Value Ref Range Status   MRSA by PCR NEGATIVE NEGATIVE Final    Comment:        The GeneXpert MRSA Assay (FDA approved for NASAL specimens only), is one component of a comprehensive MRSA colonization surveillance program. It is not intended to diagnose MRSA infection nor to guide or monitor treatment for MRSA infections.     Radiology Reports Dg Chest 1 View  Result Date: 07/25/2016 CLINICAL DATA:  Shortness of breath.  Subsequent encounter. EXAM: CHEST 1 VIEW COMPARISON:  07/22/2016 FINDINGS: Since prior study, there has been significant improvement. The bilateral irregular interstitial and hazy airspace opacities have significantly improved. Mild persistent opacity is noted in the lung bases, left greater than right. Probable small pleural effusions, left greater than right. No pneumothorax. Surgical vascular clips are superimposed over the right hilum, stable. Cardiac silhouette is normal in size. IMPRESSION: 1. Significant improvement in lung aeration consistent with improved congestive heart failure. No new abnormalities. Electronically Signed   By: Lajean Manes M.D.   On: 07/25/2016 07:27   Dg Chest 1 View  Result Date: 07/22/2016 CLINICAL DATA:  Respiratory failure.  COPD.  History of lung cancer. EXAM: CHEST 1 VIEW COMPARISON:  05/15/2016 and 04/25/2016 FINDINGS: There is new interstitial infiltrate throughout the left lung. New distention of the azygos vein and it new slight cardiomegaly. There is an increased loculated small right pleural effusion. Surgical clips in the right hilum. No acute bone abnormality. Old deformity of the proximal left humerus. IMPRESSION: Findings consistent with interstitial pulmonary edema with new pulmonary vascular congestion, cardiomegaly, and increased small right  pleural effusion Electronically Signed   By: Dina Rich.D.  On: 07/22/2016 11:39   US Pelvis Complete  Result Date: 07/14/2016 CLINICAL DATA:  Left ovarian cyst. EXAM: TRANSABDOMINAL ULTRASOUND OF PELVIS TECHNIQUE: Transabdominal ultrasound examination of the pelvis was performed including evaluation of the uterus, ovaries, adnexal regions, and pelvic cul-de-sac. COMPARISON:  Abdominal CT 04/07/2016. CT 04/11/2002 not available for review FINDINGS: Uterus Surgically absent Right ovary Not visualized, normal on previous CT. Left ovary In the same location as ovarian cyst seen on previous abdominal CT there is a similarly sized 32 x 24 x 23 mm simple appearing cyst. No internal nodularity or septation is noted. Other findings:  No abnormal free fluid. IMPRESSION: 32 x 24 x 23 mm left pelvic cyst correlating with ovarian cyst on May 2017 abdominal CT. The cyst has a simple, benign appearance and could be followed up in 1 year with ultrasound - if clinically appropriate. Electronically Signed   By: Monte Fantasia M.D.   On: 07/14/2016 13:52     CBC  Recent Labs Lab 07/21/16 1848 07/21/16 2302 07/22/16 0722 07/25/16 0648 07/26/16 0825  WBC 8.0 8.1 9.7 17.5* 12.8*  HGB 6.2* 6.6* 9.1* 9.5* 9.8*  HCT 18.8* 20.2* 26.4* 27.6* 29.1*  PLT 109* 132* 127* 98* 98*  MCV 111.4* 110.9* 100.1* 102.7* 101.4*  MCH 36.6* 36.4* 34.3* 35.3* 34.1*  MCHC 32.9 32.8 34.2 34.4 33.7  RDW 18.9* 19.4* 25.0* 23.0* 22.4*  LYMPHSABS  --   --   --   --  1.8  MONOABS  --   --   --   --  1.5*  EOSABS  --   --   --   --  0.1  BASOSABS  --   --   --   --  0.1    Chemistries   Recent Labs Lab 07/21/16 1848 07/21/16 2302 07/22/16 0722 07/23/16 0846 07/25/16 0648  NA 137 137 135 138 141  K 4.2 4.1 4.5 4.2 4.0  CL 96* 93* 96* 90* 90*  CO2 35* 37* 38* 36* 44*  GLUCOSE 102* 115* 101* 127* 106*  BUN 24* 25* 23* 37* 42*  CREATININE 0.71 0.84 0.74 1.09* 0.98  CALCIUM 9.2 8.8* 8.5* 8.5* 8.0*  MG  --  1.9  --    --   --    ------------------------------------------------------------------------------------------------------------------ estimated creatinine clearance is 35.3 mL/min (by C-G formula based on SCr of 0.98 mg/dL). ------------------------------------------------------------------------------------------------------------------ No results for input(s): HGBA1C in the last 72 hours. ------------------------------------------------------------------------------------------------------------------ No results for input(s): CHOL, HDL, LDLCALC, TRIG, CHOLHDL, LDLDIRECT in the last 72 hours. ------------------------------------------------------------------------------------------------------------------ No results for input(s): TSH, T4TOTAL, T3FREE, THYROIDAB in the last 72 hours.  Invalid input(s): FREET3 ------------------------------------------------------------------------------------------------------------------ No results for input(s): VITAMINB12, FOLATE, FERRITIN, TIBC, IRON, RETICCTPCT in the last 72 hours.  Coagulation profile  Recent Labs Lab 07/22/16 0722  INR 1.10    No results for input(s): DDIMER in the last 72 hours.  Cardiac Enzymes  Recent Labs Lab 07/21/16 1848 07/21/16 2302  TROPONINI 0.03* 0.04*   ------------------------------------------------------------------------------------------------------------------ Invalid input(s): POCBNP    Assessment & Plan   Patient's 82 year old who was admitted for anemia due to preop labs now having acute respiratory failure  1. Acute hypoxic and hypercarbic respiratory failure Due to acute fluid overload and acute COPD exacerbation Much improved Changed Lasix to daily  2. A. fib with RVR No history of atrial fibrillation oral metoprolol Cardiology consulted And appreciate input Due to lower GI bleed no aspirin or anticoagulation  2. Anemia due to chronic lower GI bleed Status post transfusion with  improvement  in her hemoglobin  3. Lung cancer not active diagnoses Outpatient follow-up as previously  4. Prolapse rectum Outpatient follow-up      Code Status Orders        Start     Ordered   07/22/16 1049  Do not attempt resuscitation (DNR)  Continuous    Question Answer Comment  In the event of cardiac or respiratory ARREST Do not call a "code blue"   In the event of cardiac or respiratory ARREST Do not perform Intubation, CPR, defibrillation or ACLS   In the event of cardiac or respiratory ARREST Use medication by any route, position, wound care, and other measures to relive pain and suffering. May use oxygen, suction and manual treatment of airway obstruction as needed for comfort.      07/22/16 1048    Code Status History    Date Active Date Inactive Code Status Order ID Comments User Context   07/21/2016 10:33 PM 07/22/2016 10:48 AM Full Code [id number]  [name], DO ED   07/21/2016 10:33 PM 07/21/2016 10:33 PM DNR [id number]  [name], DO ED   04/25/2016  3:51 AM 04/26/2016  7:38 PM DNR [id number]  [name], MD Inpatient   04/25/2016  3:29 AM 04/25/2016  3:29 AM Full Code [id number]  [name], MD ED    Advance Directive Documentation   Flowsheet Row Most Recent Value  Type of Advance Directive  Living will, Healthcare Power of Attorney  Pre-existing out of facility DNR order (yellow form or pink MOST form)  No data  "MOST" Form in Place?  No data      DVT Prophylaxis SCDs  Lab Results  Component Value Date   PLT 98 (L) 07/26/2016   Time Spent in minutes  30 min min spent  [name] R M.D on 07/26/2016 at 11:55 AM  Between 7am to 6pm - Pager - [phone]  After 6pm go to [email or web address] - password [email or web address] [place]   Office  [phone]

## 2016-07-26 NOTE — Clinical Social Work Note (Signed)
Clinical Social Work Assessment  Patient Details  Name: Sara Mccoy MRN: [id number] Date of Birth: 1926/02/10  Date of referral:  07/26/16               Reason for consult:  Facility Placement                Permission sought to share information with:  Family Supports Permission granted to share information::  Yes, Verbal Permission Granted  Name::     Tonny Bollman  Agency::     Relationship::     Contact Information:  [phone]  Housing/Transportation Living arrangements for the past 2 months:  [place] of Information:  Patient, Adult Children Patient Interpreter Needed:  None Criminal Activity/Legal Involvement Pertinent to Current Situation/Hospitalization:  No - Comment as needed Significant Relationships:  Adult Children, Community Support, [place] Lives with:  Self Do you feel safe going back to the place where you live?  Yes Need for family participation in patient care:  No (Coment)  Care giving concerns:  New SNF placement   Social Worker assessment / plan:  Patient is alert and oriented x4. Daughter, [name], was at bedside.  At baseline, patient lives alone and is independent in all ADLs. Patient ambulates with a rolling walker with a seat and uses a cane for short distances. Patient uses o2 at home through [place] at 2 liters. Patient has a good relationship with family and neighbors. Patient is able to manage medications and finances; family supports food and transportation.  Patient and daughter gave verbal permission to begin placement referrals. Patient prefers [facility] with [facility] as the second choice. Patient's goals are continued independent living post-SNF.  CSW advised patients of her role and primary CSW during the week.   Employment status:  Retired Forensic scientist:  Medicare PT Recommendations:  [place] / Referral to community resources:     Patient/Family's Response to care:   Patient and family showed gratitude to the CSW for all care and mentioned the high level of service they had received throughout the admission.  Patient/Family's Understanding of and Emotional Response to Diagnosis, Current Treatment, and Prognosis:  Patient is hopeful and determined to thrive. Daughter is supportive and able to verbalize in her own words the dc plan.  Emotional Assessment Appearance:  Appears stated age Attitude/Demeanor/Rapport:   (Patient is quite alert and pleasant/Daughter is quite pleasant) Affect (typically observed):  Appropriate, Pleasant, Hopeful, Happy Orientation:  Oriented to Self, Oriented to Place, Oriented to  Time, Oriented to Situation Alcohol / Substance use:  Never Used Psych involvement (Current and /or in the community):  No (Comment)  Discharge Needs  Concerns to be addressed:  Discharge Planning Concerns Readmission within the last 30 days:  No Current discharge risk:  Lives alone Barriers to Discharge:  Continued Medical Work up   [employer], LCSW 07/26/2016, 3:14 PM

## 2016-07-27 MED ORDER — ALPRAZOLAM 0.5 MG PO TABS: 0.5000 mg | ORAL_TABLET | Freq: Every evening | ORAL | 0 refills | Status: DC | PRN

## 2016-07-27 MED ORDER — FERROUS SULFATE 325 (65 FE) MG PO TABS: 325.0000 mg | ORAL_TABLET | Freq: Every day | ORAL | 0 refills | Status: AC

## 2016-07-27 MED ORDER — OXYCODONE-ACETAMINOPHEN 5-325 MG PO TABS: 1.0000 | ORAL_TABLET | Freq: Two times a day (BID) | ORAL | 0 refills | Status: DC | PRN

## 2016-07-27 MED ORDER — FUROSEMIDE 20 MG PO TABS: 20.0000 mg | ORAL_TABLET | Freq: Every day | ORAL | Status: DC

## 2016-07-27 MED ORDER — METOPROLOL TARTRATE 50 MG PO TABS: 50.0000 mg | ORAL_TABLET | Freq: Two times a day (BID) | ORAL | Status: DC

## 2016-07-27 MED ORDER — VITAMIN B-12 5000 MCG SL SUBL: 1.0000 | SUBLINGUAL_TABLET | Freq: Every day | SUBLINGUAL | 0 refills | Status: AC

## 2016-07-27 MED ORDER — PREDNISONE 20 MG PO TABS: 20.0000 mg | ORAL_TABLET | Freq: Every day | ORAL | 0 refills | Status: AC

## 2016-07-27 NOTE — Progress Notes (Signed)
Physical Therapy Treatment Patient Details Name: Sara Mccoy MRN: [id number] DOB: [birth year] Today's Date: 15-Aug-2016    History of Present Illness 82 yo female with onset of anemia was admitted and transfused, has aspiration PNA, had respiratory failure initially with rapid response.  Pt has PMHx:  Hospice care, Lung CA, COPD, a-fib, Raynaud's disease, on 2L O2 at home, elevated pulses    PT Comments    Pt awake and ready for session.  Transitioned out of bed to bedside commode with min assist.  She was able to ambulate 15' x 2 with walker and minguard.  Fatigues quickly and needs verbal cues for hand placements.  O2 support at 2 lpm.      Follow Up Recommendations  SNF     Equipment Recommendations  None recommended by PT    Recommendations for Other Services       Precautions / Restrictions Precautions Precautions: Fall Restrictions Weight Bearing Restrictions: No    Mobility  Bed Mobility Overal bed mobility: Modified Independent Bed Mobility: Sit to Supine     Supine to sit: Min guard        Transfers Overall transfer level: Needs assistance Equipment used: Rolling walker (2 wheeled) Transfers: Sit to/from Stand Sit to Stand: Min assist Stand pivot transfers: Min assist       General transfer comment: reminders for hand placement and to increase her control of posture with standing in place  Ambulation/Gait Ambulation/Gait assistance: Min assist Ambulation Distance (Feet): 15 Feet (x 2) Assistive device: Rolling walker (2 wheeled);1 person hand held assist Gait Pattern/deviations: Step-to pattern Gait velocity: reduced       Stairs            Wheelchair Mobility    Modified Rankin (Stroke Patients Only)       Balance Overall balance assessment: Needs assistance Sitting-balance support: Feet supported Sitting balance-Leahy Scale: Good     Standing balance support: Bilateral upper extremity supported Standing  balance-Leahy Scale: Fair                      Cognition Arousal/Alertness: Awake/alert Behavior During Therapy: WFL for tasks assessed/performed Overall Cognitive Status: Within Functional Limits for tasks assessed                      Exercises      General Comments        Pertinent Vitals/Pain Pain Assessment: No/denies pain    Home Living                      Prior Function            PT Goals (current goals can now be found in the care plan section) Progress towards PT goals: Progressing toward goals    Frequency  Min 2X/week    PT Plan      Co-evaluation             End of Session Equipment Utilized During Treatment: Gait belt;Oxygen Activity Tolerance: Patient tolerated treatment well Patient left: in chair;with call bell/phone within reach;with chair alarm set;with nursing/sitter in room     Time: 0917-0942 PT Time Calculation (min) (ACUTE ONLY): 25 min  Charges:  $Gait Training: 8-22 mins $Therapeutic Activity: 8-22 mins                    G Codes:      Chesley Noon 08/15/2016, 10:01 AM

## 2016-07-27 NOTE — Clinical Social Work Placement (Signed)
   CLINICAL SOCIAL WORK PLACEMENT  NOTE  Date:  07/27/2016  Patient Details  Name: Sara Mccoy MRN: [id number] Date of Birth: 1926/09/23  Clinical Social Work is seeking post-discharge placement for this patient at the [place] level of care (*CSW will initial, date and re-position this form in  chart as items are completed):  Yes   Patient/family provided with [place] Work Department's list of facilities offering this level of care within the geographic area requested by the patient (or if unable, by the patient's family).  Yes   Patient/family informed of their freedom to choose among providers that offer the needed level of care, that participate in Medicare, Medicaid or managed care program needed by the patient, have an available bed and are willing to accept the patient.  Yes   Patient/family informed of 's ownership interest in [facility] and [facility], as well as of the fact that they are under no obligation to receive care at these facilities.  PASRR submitted to EDS on 07/25/16     PASRR number received on 07/26/16     Existing PASRR number confirmed on 07/26/16     FL2 transmitted to all facilities in geographic area requested by pt/family on 07/26/16     FL2 transmitted to all facilities within larger geographic area on       Patient informed that his/her managed care company has contracts with or will negotiate with certain facilities, including the following:        Yes   Patient/family informed of bed offers received.  Patient chooses bed at  [facility])     Physician recommends and patient chooses bed at  [facility])    Patient to be transferred to  [facility]) on 07/27/16.  Patient to be transferred to facility by  (EMS)     Patient family notified on 07/27/16 of transfer.  Name of family member notified:   (daughter)     PHYSICIAN       Additional Comment:     _______________________________________________ [name], LCSW 07/27/2016, 2:49 PM

## 2016-07-27 NOTE — Discharge Instructions (Signed)
°  DIET:  Cardiac diet  DISCHARGE CONDITION:  Stable  ACTIVITY:  Activity as tolerated  OXYGEN:  Home Oxygen: Yes.     Oxygen Delivery: 2 liters/min via Patient connected to nasal cannula oxygen  DISCHARGE LOCATION:  nursing home   If you experience worsening of your admission symptoms, develop shortness of breath, life threatening emergency, suicidal or homicidal thoughts you must seek medical attention immediately by calling 911 or calling your MD immediately  if symptoms less severe.  You Must read complete instructions/literature along with all the possible adverse reactions/side effects for all the Medicines you take and that have been prescribed to you. Take any new Medicines after you have completely understood and accpet all the possible adverse reactions/side effects.   Please note  You were cared for by a hospitalist during your hospital stay. If you have any questions about your discharge medications or the care you received while you were in the hospital after you are discharged, you can call the unit and asked to speak with the hospitalist on call if the hospitalist that took care of you is not available. Once you are discharged, your primary care physician will handle any further medical issues. Please note that NO REFILLS for any discharge medications will be authorized once you are discharged, as it is imperative that you return to your primary care physician (or establish a relationship with a primary care physician if you do not have one) for your aftercare needs so that they can reassess your need for medications and monitor your lab values.

## 2016-07-27 NOTE — Clinical Social Work Note (Signed)
MD to discharge patient to [facility] today and Marden Noble at [facility] is aware and has discharge information. CSW has spoken to both patient and daughter and they are requesting EMS transport even though it may not be covered. Patient and daughter state her insurance is good and it has covered any other time she has needed to transport via EMS. CSW contacted patient's insurance: [employer] and spoken to Amy and she is going to review case and contact CSW with prior auth. Patient cannot discharge today until prior auth obtained. [name] MSW,LCSW [phone]

## 2016-07-27 NOTE — Clinical Social Work Note (Signed)
Josem Kaufmann has been received from [place]: [id number].  [name] MSW,LCSW [phone]

## 2016-07-27 NOTE — Care Management Important Message (Signed)
Important Message  Patient Details  Name: Sara Mccoy MRN: [id number] Date of Birth: 26-Jun-1926   Medicare Important Message Given:  Yes    Jolly Mango, RN 07/27/2016, 9:21 AM

## 2016-07-27 NOTE — Clinical Social Work Note (Signed)
Patient was denied by [facility] but accepted by her second choice: [facility]. [name] MSW,LCSW [phone]

## 2016-07-27 NOTE — Discharge Summary (Signed)
[facility] NAME: Sara Mccoy    MR#:  [id number]  DATE OF BIRTH:  [birth year]  DATE OF ADMISSION:  07/21/2016 ADMITTING PHYSICIAN: Gladstone Lighter, MD  DATE OF DISCHARGE: 07/27/2016  PRIMARY CARE PHYSICIAN: Tracie Harrier, MD   ADMISSION DIAGNOSIS:  Rectal prolapse [K62.3] Weakness [R53.1] Symptomatic anemia [D64.9] Anemia, unspecified anemia type [D64.9]  DISCHARGE DIAGNOSIS:  Active Problems:   Symptomatic anemia   Acute respiratory failure ([place])   SECONDARY DIAGNOSIS:   Past Medical History:  Diagnosis Date  . Anemia   . COPD (chronic obstructive pulmonary disease) ([place])   . Lung cancer, upper lobe ([place]) 1997   right upper lobe  . Prolapse of intestine   . Raynaud's disease      ADMITTING HISTORY  HISTORY OF PRESENT ILLNESS: Sara Mccoy is a 82 y.o. female with a known history of Home O2 dependent COPD, anemia, diverticulosis, lung cancer and renal disease was in a usual state of health until this afternoon when she received results of preop testing that demonstrated anemia. She is being medically evaluated for a surgical repair of a rectal prolapse which has caused her discomfort and bleeding. Routine labs revealed anemia. She has had similar issues in the past requiring multiple blood transfusions. She does report that she has been increasingly fatigued and slightly more short of breath than usual. She ordinarily uses 2 L of oxygen at home. She denies Chest pain, abdominal pain, lightheadedness or dizziness.  Her only other complaint is rectal discomfort secondary to prolapse.  Otherwise there has been no change in status. Patient has been taking medication as prescribed and there has been no recent change in medication or diet.  There has been no recent illness, travel or sick contacts.    Patient denies fevers/chills, weakness, dizziness, chest pain, shortness of breath, N/V/C/D, abdominal  pain, dysuria/frequency, changes in mental status.   HOSPITAL COURSE:    Patient's 82 year old who was admitted for anemia due to preop labs now having acute respiratory failure  1. Acute on chronic hypoxic and hypercarbic respiratory failure Due to acute fluid overload and acute COPD exacerbation Resolved Changed Lasix to daily  2. A. fib with RVR - Rate controlled No history of atrial fibrillation oral metoprolol Cardiology consulted And appreciate input Due to lower GI bleed no aspirin or anticoagulation  2. Anemia due to chronic lower GI bleed Status post transfusion with improvement in her hemoglobin  3. Lung cancer - not active diagnoses Outpatient follow-up as previously  4. Prolapse rectum Outpatient follow-up with Dr. Tamala Julian of surgery  Stable for discharge to SNF for PT  CONSULTS OBTAINED:  Treatment Team:  Isaias Cowman, MD  DRUG ALLERGIES:  No Known Allergies  DISCHARGE MEDICATIONS:   Current Discharge Medication List    START taking these medications   Details  ferrous sulfate 325 (65 FE) MG tablet Take 1 tablet (325 mg total) by mouth daily with breakfast. Qty: 30 tablet, Refills: 0    furosemide (LASIX) 20 MG tablet Take 1 tablet (20 mg total) by mouth daily. Qty: 30 tablet    metoprolol (LOPRESSOR) 50 MG tablet Take 1 tablet (50 mg total) by mouth 2 (two) times daily.    predniSONE (DELTASONE) 20 MG tablet Take 1 tablet (20 mg total) by mouth daily with breakfast. Qty: 3 tablet, Refills: 0      CONTINUE these medications which have CHANGED   Details  ALPRAZolam (XANAX) 0.5 MG tablet Take 1 tablet (  0.5 mg total) by mouth at bedtime as needed for anxiety. Qty: 10 tablet, Refills: 0    Cyanocobalamin (VITAMIN B-12) 5000 MCG SUBL Place 1 tablet (5,000 mcg total) under the tongue daily. Daily Qty: 30 tablet, Refills: 0    oxyCODONE-acetaminophen (PERCOCET/ROXICET) 5-325 MG tablet Take 1 tablet by mouth 2 (two) times daily as needed  for severe pain. Qty: 10 tablet, Refills: 0      CONTINUE these medications which have NOT CHANGED   Details  Ascorbic Acid (VITAMIN C) 1000 MG tablet Take 1,000 mg by mouth daily.    b complex vitamins capsule Take 1 capsule by mouth daily. Reported on 06/08/2016    Calcium Carb-Cholecalciferol (CALCIUM + D3) 600-200 MG-UNIT TABS Take 1 tablet by mouth 2 (two) times daily. Reported on 06/08/2016    cholecalciferol (VITAMIN D) 1000 units tablet Take 1,000 Units by mouth daily.    Fluticasone-Salmeterol (ADVAIR) 500-50 MCG/DOSE AEPB Inhale 1 puff into the lungs 2 (two) times daily.    ipratropium-albuterol (DUONEB) 0.5-2.5 (3) MG/3ML SOLN Take 3 mLs by nebulization every 6 (six) hours as needed. Reported on 06/08/2016    Multiple Vitamin (MULTIVITAMIN WITH MINERALS) TABS tablet Take 1 tablet by mouth daily. Reported on 06/08/2016    omeprazole (PRILOSEC) 40 MG capsule Take 40 mg by mouth daily.    venlafaxine (EFFEXOR) 37.5 MG tablet Take 37.5 mg by mouth 2 (two) times daily. Reported on 06/08/2016    vitamin E 400 UNIT capsule Take 400 Units by mouth daily.      STOP taking these medications     amLODipine (NORVASC) 5 MG tablet         Today   VITAL SIGNS:  Blood pressure 122/69, pulse 79, temperature 98.3 F (36.8 C), temperature source Oral, resp. rate 18, height '5\' 1"'$  (1.549 m), weight 69 kg (152 lb 3.2 oz), SpO2 98 %.  I/O:   Intake/Output Summary (Last 24 hours) at 07/27/16 0941 Last data filed at 07/27/16 0500  Gross per 24 hour  Intake                0 ml  Output              400 ml  Net             -400 ml    PHYSICAL EXAMINATION:  Physical Exam  GENERAL:  82 y.o.-year-old patient lying in the bed with no acute distress.  LUNGS: Normal breath sounds bilaterally, no wheezing, rales,rhonchi or crepitation. No use of accessory muscles of respiration.  CARDIOVASCULAR: S1, S2 normal. No murmurs, rubs, or gallops.  ABDOMEN: Soft, non-tender, non-distended. Bowel sounds  present. No organomegaly or mass.  NEUROLOGIC: Moves all 4 extremities. PSYCHIATRIC: The patient is alert and oriented x 3.  SKIN: No obvious rash, lesion, or ulcer.   DATA REVIEW:   CBC  Recent Labs Lab 07/26/16 0825  WBC 12.8*  HGB 9.8*  HCT 29.1*  PLT 98*    Chemistries   Recent Labs Lab 07/21/16 2302  07/25/16 0648  NA 137  < > 141  K 4.1  < > 4.0  CL 93*  < > 90*  CO2 37*  < > 44*  GLUCOSE 115*  < > 106*  BUN 25*  < > 42*  CREATININE 0.84  < > 0.98  CALCIUM 8.8*  < > 8.0*  MG 1.9  --   --   < > = values in this interval not displayed.  Cardiac  Enzymes  Recent Labs Lab 07/21/16 2302  TROPONINI 0.04*    Microbiology Results  Results for orders placed or performed during the hospital encounter of 07/21/16  MRSA PCR Screening     Status: None   Collection Time: 07/22/16 12:23 PM  Result Value Ref Range Status   MRSA by PCR NEGATIVE NEGATIVE Final    Comment:        The GeneXpert MRSA Assay (FDA approved for NASAL specimens only), is one component of a comprehensive MRSA colonization surveillance program. It is not intended to diagnose MRSA infection nor to guide or monitor treatment for MRSA infections.     RADIOLOGY:  No results found.  Follow up with PCP in 1 week.  Management plans discussed with the patient, family and they are in agreement.  CODE STATUS:     Code Status Orders        Start     Ordered   07/25/16 0821  Full code  Continuous     07/25/16 0820    Code Status History    Date Active Date Inactive Code Status Order ID Comments User Context   07/22/2016 10:49 AM 07/24/2016  7:04 PM DNR [id number]  [name], MD Inpatient   07/21/2016 10:33 PM 07/22/2016 10:48 AM Full Code [id number]  [name], DO ED   07/21/2016 10:33 PM 07/21/2016 10:33 PM DNR [id number]  [name], DO ED   04/25/2016  3:51 AM 04/26/2016  7:38 PM DNR [id number]  [name], MD Inpatient   04/25/2016  3:29 AM 04/25/2016  3:29 AM Full  Code [id number]  [name], MD ED    Advance Directive Documentation   Flowsheet Row Most Recent Value  Type of Advance Directive  Living will, Healthcare Power of Attorney  Pre-existing out of facility DNR order (yellow form or pink MOST form)  No data  "MOST" Form in Place?  No data      TOTAL TIME TAKING CARE OF THIS PATIENT ON DAY OF DISCHARGE: more than 30 minutes.   [name] R M.D on 07/27/2016 at 9:41 AM  Between 7am to 6pm - Pager - [phone]  After 6pm go to [email or web address] - password [email or web address] [facility]  Office  [phone]  CC: Primary care physician; Tracie Harrier, MD  Note: This dictation was prepared with Dragon dictation along with smaller phrase technology. Any transcriptional errors that result from this process are unintentional.

## 2016-07-27 NOTE — Progress Notes (Signed)
Pt to be discharged to [facility] today. Iv and tele removed. Report called to [facility] at the facility. Awaiting transport by ems.

## 2016-08-27 ENCOUNTER — Inpatient Hospital Stay: Payer: PPO | Admitting: Hematology and Oncology

## 2016-09-03 ENCOUNTER — Inpatient Hospital Stay: Payer: PPO | Attending: Hematology and Oncology | Admitting: Hematology and Oncology

## 2016-09-03 ENCOUNTER — Other Ambulatory Visit: Payer: Self-pay | Admitting: *Deleted

## 2016-09-03 ENCOUNTER — Inpatient Hospital Stay: Payer: PPO

## 2016-09-03 VITALS — BP 113/62 | HR 93 | Temp 98.1°F | Resp 20 | Wt 154.8 lb

## 2016-09-03 DIAGNOSIS — K623 Rectal prolapse: Secondary | ICD-10-CM | POA: Diagnosis not present

## 2016-09-03 DIAGNOSIS — J449 Chronic obstructive pulmonary disease, unspecified: Secondary | ICD-10-CM

## 2016-09-03 DIAGNOSIS — D649 Anemia, unspecified: Secondary | ICD-10-CM

## 2016-09-03 DIAGNOSIS — Z23 Encounter for immunization: Secondary | ICD-10-CM

## 2016-09-03 DIAGNOSIS — Z87891 Personal history of nicotine dependence: Secondary | ICD-10-CM | POA: Diagnosis not present

## 2016-09-03 DIAGNOSIS — I4891 Unspecified atrial fibrillation: Secondary | ICD-10-CM | POA: Insufficient documentation

## 2016-09-03 DIAGNOSIS — D472 Monoclonal gammopathy: Secondary | ICD-10-CM | POA: Insufficient documentation

## 2016-09-03 DIAGNOSIS — R5383 Other fatigue: Secondary | ICD-10-CM | POA: Diagnosis not present

## 2016-09-03 DIAGNOSIS — R531 Weakness: Secondary | ICD-10-CM | POA: Diagnosis not present

## 2016-09-03 DIAGNOSIS — Z79899 Other long term (current) drug therapy: Secondary | ICD-10-CM | POA: Insufficient documentation

## 2016-09-03 DIAGNOSIS — K625 Hemorrhage of anus and rectum: Secondary | ICD-10-CM | POA: Diagnosis not present

## 2016-09-03 DIAGNOSIS — Z85118 Personal history of other malignant neoplasm of bronchus and lung: Secondary | ICD-10-CM

## 2016-09-03 DIAGNOSIS — K634 Enteroptosis: Secondary | ICD-10-CM | POA: Diagnosis not present

## 2016-09-03 DIAGNOSIS — I7300 Raynaud's syndrome without gangrene: Secondary | ICD-10-CM

## 2016-09-03 LAB — CBC WITH DIFFERENTIAL/PLATELET
Basophils Absolute: 0.1 10*3/uL (ref 0–0.1)
Basophils Relative: 1 %
Eosinophils Absolute: 0.2 10*3/uL (ref 0–0.7)
Eosinophils Relative: 2 %
HCT: 22.6 % — ABNORMAL LOW (ref 35.0–47.0)
Hemoglobin: 7.6 g/dL — ABNORMAL LOW (ref 12.0–16.0)
Lymphocytes Relative: 14 %
Lymphs Abs: 1.3 10*3/uL (ref 1.0–3.6)
MCH: 36.8 pg — ABNORMAL HIGH (ref 26.0–34.0)
MCHC: 33.6 g/dL (ref 32.0–36.0)
MCV: 109.6 fL — ABNORMAL HIGH (ref 80.0–100.0)
Monocytes Absolute: 0.9 10*3/uL (ref 0.2–0.9)
Monocytes Relative: 11 %
Neutro Abs: 6.4 10*3/uL (ref 1.4–6.5)
Neutrophils Relative %: 72 %
Platelets: 194 10*3/uL (ref 150–440)
RBC: 2.06 MIL/uL — ABNORMAL LOW (ref 3.80–5.20)
RDW: 21.6 % — ABNORMAL HIGH (ref 11.5–14.5)
WBC: 8.9 10*3/uL (ref 3.6–11.0)

## 2016-09-03 LAB — SAMPLE TO BLOOD BANK

## 2016-09-03 LAB — RETICULOCYTES
RBC.: 2.06 MIL/uL — ABNORMAL LOW (ref 3.80–5.20)
Retic Count, Absolute: 28.8 10*3/uL (ref 19.0–183.0)
Retic Ct Pct: 1.4 % (ref 0.4–3.1)

## 2016-09-03 LAB — PREPARE RBC (CROSSMATCH)

## 2016-09-03 LAB — DAT, POLYSPECIFIC AHG (ARMC ONLY): Polyspecific AHG test: NEGATIVE

## 2016-09-03 MED ORDER — INFLUENZA VAC SPLIT QUAD 0.5 ML IM SUSY
PREFILLED_SYRINGE | INTRAMUSCULAR | Status: AC
  Filled 2016-09-03: qty 0.5

## 2016-09-03 MED ORDER — INFLUENZA VAC SPLIT QUAD 0.5 ML IM SUSY
0.5000 mL | PREFILLED_SYRINGE | Freq: Once | INTRAMUSCULAR | Status: AC
  Administered 2016-09-03: 0.5 mL via INTRAMUSCULAR

## 2016-09-03 NOTE — Progress Notes (Signed)
Patient is here for follow up, she wants flu shot   Patient was seen at her PCP today and she was told she has a prolapsed rectum.

## 2016-09-03 NOTE — Progress Notes (Addendum)
[facility] Clinic day:  09/03/2016  Chief Complaint: Sara Mccoy is a 81 y.o. female with anemia who is seen for reassessment.  HPI:   She has had a complicated medical history.  She has COPD and a history of RUL lung cancer s/p resection in 1997.  Prior anemia work-up revealed a monoclonal protein.  SPEP on 08/30/2012 revealed a 0.3 gm/dL IgM monoclonal protein with kappa light chain specificity.  She has had multiple transfusions.  She saw Dmitriy Berenzon on 01/27/2016.  She was felt to possibly have a myelodysplastic syndrome.  Supportive care with transfusion was discussed.  Bone marrow biopsy and potential hypomethylating agents were rejected as she did not want any invasive procedures and would not accept chemotherapy.  Target hemoglobin was 9 given her lung resection.  Retic was 1.1% (low).  The patient was admitted to [facility] from 07/21/2016 - 07/27/2016 with symptomatic anemia.  She was transfused with 2 units of PRBCs.  She was admitted to the ICU with acute hypoxic respiratory failure possibly related to volume overload post transfusion.  She was treated with Lasix and oxygen.  She also had atrial fibrillation with RVR.  Echo on 07/24/2016 revealed an EF of 55%.  She was seen by Dr. Saralyn Pilar.    She was discharged to [facility] for rehabilitation x 17 days.  CBC on admission included a hematocrit of 18.8, hemoglobin 6.2, MCV 111.4, platelets 9000, white count 8000.  She was transfused with 2 units of PRBCs. CBC on 07/26/2016 included a hematocrit 29.1, hemoglobin 9.8, MCV 101.4, platelets 98,000, white count 12,800.  Labs on 07/21/2016 included a ferritin of 709, TIBC 232, and 45% iron saturation. Folate was 14.4. B12 was 662.  Creatinine was 0.98 on 07/25/2016.  She was seen by Dr. Rochel Brome for surgical repair  of a rectal prolapse cause discomfort and bleeding this morning. Plan is for transfusion to a hemoglobin goal of 10  then surgery.  Symptomatically, she feels so weak and overall "not good".  Her weight is down. She is only "peeing and pooing". She is frustrated.  She sees blood in the toilet with bowel movements. She can't feel her bowel movements.  It is hard for her to sit down.   Past Medical History:  Diagnosis Date  . Anemia   . COPD (chronic obstructive pulmonary disease) ([place])   . Lung cancer, upper lobe ([place]) 1997   right upper lobe  . Prolapse of intestine   . Raynaud's disease     Past Surgical History:  Procedure Laterality Date  . ABDOMINAL HYSTERECTOMY    . CESAREAN SECTION    . CHOLECYSTECTOMY    . FLEXIBLE SIGMOIDOSCOPY N/A 04/25/2016   Procedure: FLEXIBLE SIGMOIDOSCOPY;  Surgeon: Lucilla Lame, MD;  Location: [facility] ENDOSCOPY;  Service: Endoscopy;  Laterality: N/A;  . JOINT REPLACEMENT    . LUNG SURGERY Right 1997   RUL removed    Family History  Problem Relation Age of Onset  . Heart attack Son   . Diabetes Son   . Diabetes Mother   . Hypertension Daughter     Social History:  reports that she has quit smoking. She has a 92.00 pack-year smoking history. She has never used smokeless tobacco. She reports that she does not drink alcohol or use drugs.  She lives with her grand daughter.  Her daughter lives 5 miles away.  The patient is accompanied by her daughter, Romie Minus, today.  Allergies: No Known Allergies  Current Medications: Current Outpatient Prescriptions  Medication Sig Dispense Refill  . ALPRAZolam (XANAX) 0.5 MG tablet Take 1 tablet (0.5 mg total) by mouth at bedtime as needed for anxiety. 10 tablet 0  . Ascorbic Acid (VITAMIN C) 1000 MG tablet Take 1,000 mg by mouth daily.    [place] Kitchen b complex vitamins capsule Take 1 capsule by mouth daily. Reported on 06/08/2016    . Calcium Carb-Cholecalciferol (CALCIUM + D3) 600-200 MG-UNIT TABS Take 1 tablet by mouth 2 (two) times daily. Reported on 06/08/2016    . Cyanocobalamin (VITAMIN B-12) 5000 MCG SUBL Place 1 tablet (5,000 mcg  total) under the tongue daily. Daily 30 tablet 0  . ferrous sulfate 325 (65 FE) MG tablet Take 1 tablet (325 mg total) by mouth daily with breakfast. 30 tablet 0  . Fluticasone-Salmeterol (ADVAIR) 500-50 MCG/DOSE AEPB Inhale 1 puff into the lungs 2 (two) times daily.    [place] Kitchen ipratropium-albuterol (DUONEB) 0.5-2.5 (3) MG/3ML SOLN Take 3 mLs by nebulization every 6 (six) hours as needed. Reported on 06/08/2016    . Multiple Vitamin (MULTIVITAMIN WITH MINERALS) TABS tablet Take 1 tablet by mouth daily. Reported on 06/08/2016    . omeprazole (PRILOSEC) 40 MG capsule Take 40 mg by mouth daily.    [place] Kitchen oxyCODONE-acetaminophen (PERCOCET/ROXICET) 5-325 MG tablet Take 1 tablet by mouth 2 (two) times daily as needed for severe pain. 10 tablet 0  . venlafaxine (EFFEXOR) 37.5 MG tablet Take 37.5 mg by mouth 2 (two) times daily. Reported on 06/08/2016    . vitamin E 400 UNIT capsule Take 400 Units by mouth daily.     Current Facility-Administered Medications  Medication Dose Route Frequency Provider Last Rate Last Dose  . Influenza vac split quadrivalent PF (FLUARIX) injection 0.5 mL  0.5 mL Intramuscular Once Lequita Asal, MD        Review of Systems:  GENERAL:  Feels "bad and not well".  No fevers or sweats.  Weight loss. PERFORMANCE STATUS (ECOG):  3 HEENT:  No visual changes, runny nose, sore throat, mouth sores or tenderness. Lungs: Shortness of breath.  No cough.  No hemoptysis. Cardiac:  No chest pain, palpitations, orthopnea, or PND. GI:   Painful rectal prolapse.  Rectal bleeding.  Diarrhea.  No nausea, vomiting, constipation, or melena. GU:  No urgency, frequency, dysuria, or hematuria. Musculoskeletal:  No back pain.  No joint pain.  No muscle tenderness. Extremities:  No pain or swelling. Skin:  No rashes or skin changes. Neuro:  No headache, numbness or weakness, balance or coordination issues. Endocrine:  No diabetes, thyroid issues, hot flashes or night sweats. Psych:  Frustrated.  No mood  changes, depression or anxiety. Pain:  No focal pain. Review of systems:  All other systems reviewed and found to be negative.  Physical Exam: Blood pressure 113/62, pulse 93, temperature 98.1 F (36.7 C), temperature source Tympanic, resp. rate 20, weight 154 lb 12.8 oz (70.2 kg). GENERAL:  Chronically ill appearing woman sitting comfortably in a wheelchair in the exam room in no acute distress. MENTAL STATUS:  Alert and oriented to person, place and time. HEAD:  Pearline Cables hair.  Normocephalic, atraumatic, face symmetric, no Cushingoid features. EYES:  Pupils equal round and reactive to light and accomodation.  No conjunctivitis or scleral icterus. ENT:  Oropharynx clear without lesion.  Tongue normal. Mucous membranes moist.  RESPIRATORY:  Clear to auscultation without rales, wheezes or rhonchi. CARDIOVASCULAR:  Regular rate and rhythm without murmur, rub or gallop. ABDOMEN:  Soft, non-tender,  with active bowel sounds, and no hepatosplenomegaly.  No masses. SKIN:  No rashes, ulcers or lesions. EXTREMITIES:  Chronic lower extremity edema.  No skin discoloration or tenderness.  No palpable cords. LYMPH NODES: No palpable cervical, supraclavicular, axillary or inguinal adenopathy  NEUROLOGICAL: Unremarkable. PSYCH:  Appropriate.   Appointment on 09/03/2016  Component Date Value Ref Range Status  . WBC 09/03/2016 8.9  3.6 - 11.0 K/uL Final  . RBC 09/03/2016 2.06* 3.80 - 5.20 MIL/uL Final  . Hemoglobin 09/03/2016 7.6* 12.0 - 16.0 g/dL Final  . HCT 09/03/2016 22.6* 35.0 - 47.0 % Final  . MCV 09/03/2016 109.6* 80.0 - 100.0 fL Final  . MCH 09/03/2016 36.8* 26.0 - 34.0 pg Final  . MCHC 09/03/2016 33.6  32.0 - 36.0 g/dL Final  . RDW 09/03/2016 21.6* 11.5 - 14.5 % Final  . Platelets 09/03/2016 194  150 - 440 K/uL Final  . Neutrophils Relative % 09/03/2016 72  % Final  . Neutro Abs 09/03/2016 6.4  1.4 - 6.5 K/uL Final  . Lymphocytes Relative 09/03/2016 14  % Final  . Lymphs Abs 09/03/2016 1.3   1.0 - 3.6 K/uL Final  . Monocytes Relative 09/03/2016 11  % Final  . Monocytes Absolute 09/03/2016 0.9  0.2 - 0.9 K/uL Final  . Eosinophils Relative 09/03/2016 2  % Final  . Eosinophils Absolute 09/03/2016 0.2  0 - 0.7 K/uL Final  . Basophils Relative 09/03/2016 1  % Final  . Basophils Absolute 09/03/2016 0.1  0 - 0.1 K/uL Final  . Retic Ct Pct 09/03/2016 1.4  0.4 - 3.1 % Final  . RBC. 09/03/2016 2.06* 3.80 - 5.20 MIL/uL Final  . Retic Count, Manual 09/03/2016 28.8  19.0 - 183.0 K/uL Final    Assessment:  Sara Mccoy is a 81 y.o. female with chronic macrocytic anemia likely secondary to an underlying myelodysplastic syndrome.  She has ongoing transfusion requirements secondary to rectal bleeding.  She has an enlarging rectal prolapse.  She has had multiple transfusions.  She has a history of a monoclonal gammopathy.  SPEP on 08/30/2012 revealed a 0.3 gm/dL IgM monoclonal protein with kappa light chain specificity.  The patient was admitted to [facility] from 07/21/2016 - 07/27/2016 with symptomatic anemia.  She was transfused with 2 units of PRBCs.  She was transferred to the ICU with acute hypoxic respiratory failure possibly related to volume overload post transfusion.  She was treated with Lasix and oxygen.  She also had atrial fibrillation with RVR.  Echo on 07/24/2016 revealed an EF of 55%.  She was seen by Dr. Saralyn Pilar.    Labs on 07/21/2016 included a ferritin of 709, TIBC 232, and 45% iron saturation. Folate was 14.4. B12 was 662.  Creatinine was 0.98 on 07/25/2016.  She was is being scheduled for rectal prolapse repair.  Flexible sigmoidoscopy on 04/25/2016 revealed only rectal prolapse.  Hemoglobin goal prior to surgery is 10.   Symptomatically, she feels weak.  Her weight is down.  She has ongoing rectal bleeding.  It is hard for her to sit down.  Plan: 1.  Discuss chronic anemia. Red cells are macrocytic. Discuss laboratory workup. Suspect underlying myelodysplastic  syndrome with progressive anemia secondary to blood loss from rectal prolapse.  2.  Discuss rectal prolapse and plan for repair.  Discuss plan from Dr. Rochel Brome regarding increasing hemoglobin to 10. Transfusions will occur every 2-3 days with only 1 unit given at a time because of her history of volume overload. Phone follow-up with  Dr. Saralyn Pilar regarding any specific management besides giving only 1 unit at a time over 3 hours.  Consider additional Lasix if felt beneficial. 3.  Labs today:  CBC with diff, hold tube, myeloma panel, free light chains, retic count. 4.  Influenza vaccine. 5.  Transfuse 1 unit PRBCs tomorrow 6.  RTC on 10/02 for labs (CBC, type and cross) 7.  RTC on 10/03 for 1 unit PRBCs 8.  RTC on 10/05 for labs (CBC, type and screen) 9.  RTC on 10/06 for 1 unit of PRBCs 10.  RTC in 1 month for MD assessment and labs (CBC with diff).   Lequita Asal, MD  09/03/2016, 5:13 PM

## 2016-09-04 ENCOUNTER — Other Ambulatory Visit: Payer: Self-pay | Admitting: *Deleted

## 2016-09-04 ENCOUNTER — Inpatient Hospital Stay: Payer: PPO

## 2016-09-04 DIAGNOSIS — D649 Anemia, unspecified: Secondary | ICD-10-CM

## 2016-09-04 LAB — KAPPA/LAMBDA LIGHT CHAINS
Kappa free light chain: 60.5 mg/L — ABNORMAL HIGH (ref 3.3–19.4)
Kappa, lambda light chain ratio: 2.37 — ABNORMAL HIGH (ref 0.26–1.65)
Lambda free light chains: 25.5 mg/L (ref 5.7–26.3)

## 2016-09-05 ENCOUNTER — Encounter: Payer: Self-pay | Admitting: Emergency Medicine

## 2016-09-05 ENCOUNTER — Encounter: Payer: Self-pay | Admitting: Hematology and Oncology

## 2016-09-05 ENCOUNTER — Inpatient Hospital Stay
Admission: EM | Admit: 2016-09-05 | Discharge: 2016-09-11 | DRG: 347 | Disposition: A | Discharge status: Skilled Nursing Facility | Payer: PPO | Attending: Internal Medicine | Admitting: Internal Medicine

## 2016-09-05 DIAGNOSIS — Z833 Family history of diabetes mellitus: Secondary | ICD-10-CM

## 2016-09-05 DIAGNOSIS — Z8249 Family history of ischemic heart disease and other diseases of the circulatory system: Secondary | ICD-10-CM

## 2016-09-05 DIAGNOSIS — Z87891 Personal history of nicotine dependence: Secondary | ICD-10-CM

## 2016-09-05 DIAGNOSIS — K922 Gastrointestinal hemorrhage, unspecified: Secondary | ICD-10-CM | POA: Diagnosis present

## 2016-09-05 DIAGNOSIS — R262 Difficulty in walking, not elsewhere classified: Secondary | ICD-10-CM

## 2016-09-05 DIAGNOSIS — Z90710 Acquired absence of both cervix and uterus: Secondary | ICD-10-CM | POA: Diagnosis not present

## 2016-09-05 DIAGNOSIS — K625 Hemorrhage of anus and rectum: Secondary | ICD-10-CM | POA: Diagnosis present

## 2016-09-05 DIAGNOSIS — K219 Gastro-esophageal reflux disease without esophagitis: Secondary | ICD-10-CM | POA: Diagnosis present

## 2016-09-05 DIAGNOSIS — I480 Paroxysmal atrial fibrillation: Secondary | ICD-10-CM | POA: Diagnosis present

## 2016-09-05 DIAGNOSIS — D638 Anemia in other chronic diseases classified elsewhere: Secondary | ICD-10-CM | POA: Diagnosis present

## 2016-09-05 DIAGNOSIS — E860 Dehydration: Secondary | ICD-10-CM | POA: Diagnosis present

## 2016-09-05 DIAGNOSIS — D7589 Other specified diseases of blood and blood-forming organs: Secondary | ICD-10-CM | POA: Diagnosis present

## 2016-09-05 DIAGNOSIS — Z66 Do not resuscitate: Secondary | ICD-10-CM | POA: Diagnosis present

## 2016-09-05 DIAGNOSIS — J9610 Chronic respiratory failure, unspecified whether with hypoxia or hypercapnia: Secondary | ICD-10-CM | POA: Diagnosis present

## 2016-09-05 DIAGNOSIS — Z85118 Personal history of other malignant neoplasm of bronchus and lung: Secondary | ICD-10-CM | POA: Diagnosis not present

## 2016-09-05 DIAGNOSIS — Z79899 Other long term (current) drug therapy: Secondary | ICD-10-CM

## 2016-09-05 DIAGNOSIS — I472 Ventricular tachycardia: Secondary | ICD-10-CM | POA: Diagnosis present

## 2016-09-05 DIAGNOSIS — Z9660 Presence of unspecified orthopedic joint implant: Secondary | ICD-10-CM | POA: Diagnosis present

## 2016-09-05 DIAGNOSIS — E876 Hypokalemia: Secondary | ICD-10-CM | POA: Diagnosis present

## 2016-09-05 DIAGNOSIS — M6281 Muscle weakness (generalized): Secondary | ICD-10-CM

## 2016-09-05 DIAGNOSIS — F419 Anxiety disorder, unspecified: Secondary | ICD-10-CM | POA: Diagnosis present

## 2016-09-05 DIAGNOSIS — Z6824 Body mass index (BMI) 24.0-24.9, adult: Secondary | ICD-10-CM

## 2016-09-05 DIAGNOSIS — D472 Monoclonal gammopathy: Secondary | ICD-10-CM | POA: Diagnosis present

## 2016-09-05 DIAGNOSIS — K623 Rectal prolapse: Secondary | ICD-10-CM | POA: Insufficient documentation

## 2016-09-05 DIAGNOSIS — J449 Chronic obstructive pulmonary disease, unspecified: Secondary | ICD-10-CM | POA: Diagnosis present

## 2016-09-05 DIAGNOSIS — E43 Unspecified severe protein-calorie malnutrition: Secondary | ICD-10-CM | POA: Diagnosis present

## 2016-09-05 DIAGNOSIS — Z9049 Acquired absence of other specified parts of digestive tract: Secondary | ICD-10-CM | POA: Diagnosis not present

## 2016-09-05 DIAGNOSIS — D469 Myelodysplastic syndrome, unspecified: Secondary | ICD-10-CM | POA: Diagnosis present

## 2016-09-05 DIAGNOSIS — D689 Coagulation defect, unspecified: Secondary | ICD-10-CM | POA: Diagnosis present

## 2016-09-05 DIAGNOSIS — I7300 Raynaud's syndrome without gangrene: Secondary | ICD-10-CM | POA: Diagnosis present

## 2016-09-05 DIAGNOSIS — Z7951 Long term (current) use of inhaled steroids: Secondary | ICD-10-CM | POA: Diagnosis not present

## 2016-09-05 DIAGNOSIS — I10 Essential (primary) hypertension: Secondary | ICD-10-CM | POA: Diagnosis present

## 2016-09-05 DIAGNOSIS — I482 Chronic atrial fibrillation: Secondary | ICD-10-CM | POA: Diagnosis present

## 2016-09-05 DIAGNOSIS — D649 Anemia, unspecified: Secondary | ICD-10-CM | POA: Diagnosis present

## 2016-09-05 LAB — CBC
HCT: 24.7 % — ABNORMAL LOW (ref 35.0–47.0)
HEMOGLOBIN: 8.2 g/dL — AB (ref 12.0–16.0)
MCH: 36.4 pg — AB (ref 26.0–34.0)
MCHC: 33.1 g/dL (ref 32.0–36.0)
MCV: 109.8 fL — AB (ref 80.0–100.0)
Platelets: 177 10*3/uL (ref 150–440)
RBC: 2.25 MIL/uL — AB (ref 3.80–5.20)
RDW: 21.5 % — ABNORMAL HIGH (ref 11.5–14.5)
WBC: 8.5 10*3/uL (ref 3.6–11.0)

## 2016-09-05 LAB — COMPREHENSIVE METABOLIC PANEL
ALBUMIN: 3.4 g/dL — AB (ref 3.5–5.0)
ALK PHOS: 62 U/L (ref 38–126)
ALT: 16 U/L (ref 14–54)
ANION GAP: 6 (ref 5–15)
AST: 21 U/L (ref 15–41)
BUN: 24 mg/dL — ABNORMAL HIGH (ref 6–20)
CHLORIDE: 103 mmol/L (ref 101–111)
CO2: 31 mmol/L (ref 22–32)
Calcium: 8.8 mg/dL — ABNORMAL LOW (ref 8.9–10.3)
Creatinine, Ser: 0.99 mg/dL (ref 0.44–1.00)
GFR calc non Af Amer: 49 mL/min — ABNORMAL LOW (ref >60)
GFR, EST AFRICAN AMERICAN: 57 mL/min — AB (ref >60)
GLUCOSE: 95 mg/dL (ref 65–99)
Potassium: 4.1 mmol/L (ref 3.5–5.1)
SODIUM: 140 mmol/L (ref 135–145)
Total Bilirubin: 0.7 mg/dL (ref 0.3–1.2)
Total Protein: 6 g/dL — ABNORMAL LOW (ref 6.5–8.1)

## 2016-09-05 LAB — PREPARE RBC (CROSSMATCH)

## 2016-09-05 MED ORDER — SODIUM CHLORIDE 0.9 % IV SOLN
10.0000 mL/h | Freq: Once | INTRAVENOUS | Status: DC
Start: 2016-09-05 — End: 2016-09-05

## 2016-09-05 NOTE — ED Notes (Signed)
Per MD pt blood transfusion is non-emergent. Pt made aware that transfusion will occur when pt is admitted to the floor due to type and screen specific blood available at that time. MD aware and advised this RN to hold blood transfusion at this time.

## 2016-09-05 NOTE — ED Triage Notes (Signed)
Pt to rm 25 via EMS from home, report pt hx of prolapsed rectum since June which she already has surgery planned, also has scheduled transfusion planned for Monday.  Pt report increased bright red rectal bleeding and diarrhea and feeling weak.  Per EMS pt on 3L chronically.  En route, HR 100-120s w/ PVCs, 130/60.  PT NAD upon arrival.  Pt denies pain, just c/o weakness.

## 2016-09-05 NOTE — ED Provider Notes (Signed)
Time Seen: Approximately 2122  I have reviewed the triage notes  Chief Complaint: Rectal Bleeding   History of Present Illness: Sara Mccoy is a 82 y.o. female who presents with feelings of anemia. Patient has a known history of rectal prolapse with bleeding. The plan is to stabilize her hemoglobin and then perform surgery. Patient's had this issue since June of this year and periodically requires transfusions. She states currently her prolapse is back in and out but she has noticed redness become more and more difficult for her to get the prolapse back appropriately. The pain at this time and states the bleeding may have accelerated since her last evaluation here in emergency department. She describes generalized weakness and feeling lightheaded upon ambulation. She states she was supposed to get a blood transfusion yesterday but she had a lot of diarrhea and bleeding was unable to make it into the cancer center.   Past Medical History:  Diagnosis Date  . Anemia   . COPD (chronic obstructive pulmonary disease) ([place])   . Lung cancer, upper lobe ([place]) 1997   right upper lobe  . Prolapse of intestine   . Raynaud's disease     Patient Active Problem List   Diagnosis Date Noted  . Rectal prolapse 09/05/2016  . Acute respiratory failure ([place]) 07/22/2016  . Symptomatic anemia 07/21/2016  . GI bleed 04/25/2016  . Abdominal mass   . Absolute anemia   . Blood in stool   . Diverticulosis of large intestine without diverticulitis     Past Surgical History:  Procedure Laterality Date  . ABDOMINAL HYSTERECTOMY    . CESAREAN SECTION    . CHOLECYSTECTOMY    . FLEXIBLE SIGMOIDOSCOPY N/A 04/25/2016   Procedure: FLEXIBLE SIGMOIDOSCOPY;  Surgeon: Lucilla Lame, MD;  Location: [facility] ENDOSCOPY;  Service: Endoscopy;  Laterality: N/A;  . JOINT REPLACEMENT    . LUNG SURGERY Right 1997   RUL removed    Past Surgical History:  Procedure Laterality Date  . ABDOMINAL HYSTERECTOMY     . CESAREAN SECTION    . CHOLECYSTECTOMY    . FLEXIBLE SIGMOIDOSCOPY N/A 04/25/2016   Procedure: FLEXIBLE SIGMOIDOSCOPY;  Surgeon: Lucilla Lame, MD;  Location: [facility] ENDOSCOPY;  Service: Endoscopy;  Laterality: N/A;  . JOINT REPLACEMENT    . LUNG SURGERY Right 1997   RUL removed    Current Outpatient Rx  . Order #: [id number] Class: Print  . Order #: [id number] Class: Historical Med  . Order #: [id number] Class: Historical Med  . Order #: [id number] Class: Historical Med  . Order #: [id number] Class: Historical Med  . Order #: [id number] Class: Historical Med  . Order #: [id number] Class: No Print  . Order #: [id number] Class: No Print  . Order #: [id number] Class: Historical Med  . Order #: [id number] Class: Historical Med  . Order #: [id number] Class: Historical Med  . Order #: [id number] Class: Historical Med  . Order #: [id number] Class: Historical Med  . Order #: [id number] Class: Print  . Order #: [id number] Class: Historical Med  . Order #: [id number] Class: Historical Med  . Order #: [id number] Class: Historical Med    Allergies:  Review of patient's allergies indicates no known allergies.  Family History: Family History  Problem Relation Age of Onset  . Heart attack Son   . Diabetes Son   . Diabetes Mother   . Hypertension Daughter     Social History: Social History  Substance Use Topics  . Smoking status: Former Smoker    Packs/day: 2.00    Years: 46.00  . Smokeless tobacco: Never  Used  . Alcohol use No     Review of Systems:   10 point review of systems was performed and was otherwise negative:  Constitutional: No fever Eyes: No visual disturbances ENT: No sore throat, ear pain Cardiac: No chest pain Respiratory: No shortness of breath, wheezing, or stridor Abdomen: No abdominal pain, no vomiting, No diarrhea Endocrine: No weight loss, No night sweats Extremities: No peripheral edema, cyanosis Skin: No rashes, easy bruising Neurologic: No focal weakness, trouble with speech or  swollowing Urologic: No dysuria, Hematuria, or urinary frequency   Physical Exam:  ED Triage Vitals  Enc Vitals Group     BP 09/05/16 2041 124/74     Pulse Rate 09/05/16 2041 (!) 102     Resp 09/05/16 2041 20     Temp 09/05/16 2041 98.4 F (36.9 C)     Temp Source 09/05/16 2041 Oral     SpO2 09/05/16 2041 100 %     Weight 09/05/16 2039 132 lb (59.9 kg)     Height 09/05/16 2039 '5\' 1"'$  (1.549 m)     Head Circumference --      Peak Flow --      Pain Score 09/05/16 2039 0     Pain Loc --      Pain Edu? --      Excl. in [place]? --     General: Awake , Alert , and Oriented times 3; GCS 15 Head: Normal cephalic , atraumatic Eyes: Pupils equal , round, reactive to light Nose/Throat: No nasal drainage, patent upper airway without erythema or exudate.  Neck: Supple, Full range of motion, No anterior adenopathy or palpable thyroid masses Lungs: Clear to ascultation without wheezes , rhonchi, or rales Heart: Regular rate, irregular rhythm without murmurs , gallops , or rubs Abdomen: Soft, non tender without rebound, guarding , or rigidity; bowel sounds positive and symmetric in all 4 quadrants. No organomegaly .   Exam of the rectal area shows no prolapse at present.      Extremities: 2 plus symmetric pulses. No edema, clubbing or cyanosis Neurologic: normal ambulation, Motor symmetric without deficits, sensory intact Skin: warm, dry, no rashes   Labs:   All laboratory work was reviewed including any pertinent negatives or positives listed below:  Labs Reviewed  COMPREHENSIVE METABOLIC PANEL - Abnormal; Notable for the following:       Result Value   BUN 24 (*)    Calcium 8.8 (*)    Total Protein 6.0 (*)    Albumin 3.4 (*)    GFR calc non Af Amer 49 (*)    GFR calc Af Amer 57 (*)    All other components within normal limits  CBC - Abnormal; Notable for the following:    RBC 2.25 (*)    Hemoglobin 8.2 (*)    HCT 24.7 (*)    MCV 109.8 (*)    MCH 36.4 (*)    RDW 21.5 (*)    All  other components within normal limits  PREPARE RBC (CROSSMATCH)   ED Course:  The patient's currently hemodynamically stable though does have symptomatic anemia. The patient's been already typed and screened so I put in for blood transfusion orders.  Clinical Course     Assessment:  Rectal bleeding Rectal prolapse Anemia    Plan:  Inpatient           Daymon Larsen, MD 09/05/16 2316

## 2016-09-06 ENCOUNTER — Inpatient Hospital Stay
Admit: 2016-09-06 | Discharge: 2016-09-06 | Disposition: A | Discharge status: Home / Self Care | Payer: PPO | Attending: Cardiovascular Disease | Admitting: Cardiovascular Disease

## 2016-09-06 LAB — BASIC METABOLIC PANEL
ANION GAP: 2 — AB (ref 5–15)
BUN: 19 mg/dL (ref 6–20)
CALCIUM: 7.2 mg/dL — AB (ref 8.9–10.3)
CHLORIDE: 112 mmol/L — AB (ref 101–111)
CO2: 29 mmol/L (ref 22–32)
Creatinine, Ser: 0.78 mg/dL (ref 0.44–1.00)
GFR calc non Af Amer: >60 mL/min (ref >60)
GLUCOSE: 92 mg/dL (ref 65–99)
Potassium: 3.5 mmol/L (ref 3.5–5.1)
Sodium: 143 mmol/L (ref 135–145)

## 2016-09-06 LAB — CBC
HEMATOCRIT: 25.7 % — AB (ref 35.0–47.0)
HEMOGLOBIN: 8.5 g/dL — AB (ref 12.0–16.0)
MCH: 34.8 pg — AB (ref 26.0–34.0)
MCHC: 33 g/dL (ref 32.0–36.0)
MCV: 105.6 fL — AB (ref 80.0–100.0)
Platelets: 131 10*3/uL — ABNORMAL LOW (ref 150–440)
RBC: 2.44 MIL/uL — ABNORMAL LOW (ref 3.80–5.20)
RDW: 22.6 % — ABNORMAL HIGH (ref 11.5–14.5)
WBC: 6.6 10*3/uL (ref 3.6–11.0)

## 2016-09-06 LAB — MAGNESIUM
Magnesium: 1.9 mg/dL (ref 1.7–2.4)
Magnesium: 1.6 mg/dL — ABNORMAL LOW (ref 1.7–2.4)

## 2016-09-06 LAB — PHOSPHORUS: PHOSPHORUS: 3.7 mg/dL (ref 2.5–4.6)

## 2016-09-06 LAB — PROTIME-INR
INR: 1.31
Prothrombin Time: 16.4 seconds — ABNORMAL HIGH (ref 11.4–15.2)

## 2016-09-06 MED ORDER — ENOXAPARIN SODIUM 40 MG/0.4ML ~~LOC~~ SOLN
40.0000 mg | Freq: Every day | SUBCUTANEOUS | Status: DC
  Administered 2016-09-06: 40 mg via SUBCUTANEOUS
  Filled 2016-09-06: qty 0.4

## 2016-09-06 MED ORDER — ADULT MULTIVITAMIN W/MINERALS CH
1.0000 | ORAL_TABLET | Freq: Every day | ORAL | Status: DC
  Administered 2016-09-06 – 2016-09-11 (×5): 1 via ORAL
  Filled 2016-09-06 (×5): qty 1

## 2016-09-06 MED ORDER — METOPROLOL TARTRATE 50 MG PO TABS
50.0000 mg | ORAL_TABLET | Freq: Two times a day (BID) | ORAL | Status: DC
  Administered 2016-09-06 – 2016-09-07 (×4): 50 mg via ORAL
  Filled 2016-09-06 (×5): qty 1

## 2016-09-06 MED ORDER — SENNOSIDES-DOCUSATE SODIUM 8.6-50 MG PO TABS: 1.0000 | ORAL_TABLET | Freq: Every evening | ORAL | Status: DC | PRN

## 2016-09-06 MED ORDER — PANTOPRAZOLE SODIUM 40 MG PO TBEC
40.0000 mg | DELAYED_RELEASE_TABLET | Freq: Every day | ORAL | Status: DC
  Administered 2016-09-06 – 2016-09-11 (×6): 40 mg via ORAL
  Filled 2016-09-06 (×6): qty 1

## 2016-09-06 MED ORDER — ALPRAZOLAM 0.5 MG PO TABS
0.5000 mg | ORAL_TABLET | Freq: Every evening | ORAL | Status: DC | PRN
  Administered 2016-09-06 – 2016-09-07 (×2): 0.5 mg via ORAL
  Filled 2016-09-06 (×2): qty 1

## 2016-09-06 MED ORDER — FERROUS SULFATE 325 (65 FE) MG PO TABS
325.0000 mg | ORAL_TABLET | Freq: Every day | ORAL | Status: DC
  Administered 2016-09-06 – 2016-09-11 (×5): 325 mg via ORAL
  Filled 2016-09-06 (×5): qty 1

## 2016-09-06 MED ORDER — VITAMIN E 180 MG (400 UNIT) PO CAPS
400.0000 [IU] | ORAL_CAPSULE | Freq: Every day | ORAL | Status: DC
  Administered 2016-09-06 – 2016-09-11 (×5): 400 [IU] via ORAL
  Filled 2016-09-06 (×5): qty 1

## 2016-09-06 MED ORDER — ONDANSETRON HCL 4 MG/2ML IJ SOLN
4.0000 mg | Freq: Four times a day (QID) | INTRAMUSCULAR | Status: DC | PRN
  Administered 2016-09-08: 4 mg via INTRAVENOUS
  Filled 2016-09-06: qty 2

## 2016-09-06 MED ORDER — CALCIUM CARBONATE-VITAMIN D 500-200 MG-UNIT PO TABS
1.0000 | ORAL_TABLET | Freq: Two times a day (BID) | ORAL | Status: DC
  Administered 2016-09-06 – 2016-09-11 (×10): 1 via ORAL
  Filled 2016-09-06 (×10): qty 1

## 2016-09-06 MED ORDER — SODIUM CHLORIDE 0.9% FLUSH
3.0000 mL | Freq: Two times a day (BID) | INTRAVENOUS | Status: DC
  Administered 2016-09-06 – 2016-09-11 (×11): 3 mL via INTRAVENOUS

## 2016-09-06 MED ORDER — BISACODYL 5 MG PO TBEC: 5.0000 mg | DELAYED_RELEASE_TABLET | Freq: Every day | ORAL | Status: DC | PRN

## 2016-09-06 MED ORDER — VITAMIN D 1000 UNITS PO TABS
1000.0000 [IU] | ORAL_TABLET | Freq: Every day | ORAL | Status: DC
  Administered 2016-09-06 – 2016-09-11 (×5): 1000 [IU] via ORAL
  Filled 2016-09-06 (×5): qty 1

## 2016-09-06 MED ORDER — OXYCODONE-ACETAMINOPHEN 5-325 MG PO TABS
1.0000 | ORAL_TABLET | Freq: Two times a day (BID) | ORAL | Status: DC | PRN
  Administered 2016-09-09: 1 via ORAL
  Filled 2016-09-06: qty 1

## 2016-09-06 MED ORDER — VITAMIN C 500 MG PO TABS
1000.0000 mg | ORAL_TABLET | Freq: Every day | ORAL | Status: DC
  Administered 2016-09-06 – 2016-09-11 (×5): 1000 mg via ORAL
  Filled 2016-09-06 (×5): qty 2

## 2016-09-06 MED ORDER — SODIUM CHLORIDE 0.9 % IV SOLN
INTRAVENOUS | Status: DC
  Administered 2016-09-06: 11:00:00 via INTRAVENOUS

## 2016-09-06 MED ORDER — MAGNESIUM CITRATE PO SOLN: 1.0000 | Freq: Once | ORAL | Status: DC | PRN

## 2016-09-06 MED ORDER — SODIUM CHLORIDE 0.9 % IV SOLN
Freq: Once | INTRAVENOUS | Status: AC
  Administered 2016-09-06: 03:00:00 via INTRAVENOUS

## 2016-09-06 MED ORDER — ACETAMINOPHEN 325 MG PO TABS
650.0000 mg | ORAL_TABLET | Freq: Four times a day (QID) | ORAL | Status: DC | PRN
  Administered 2016-09-06: 650 mg via ORAL
  Filled 2016-09-06: qty 2

## 2016-09-06 MED ORDER — VENLAFAXINE HCL ER 37.5 MG PO CP24
37.5000 mg | ORAL_CAPSULE | Freq: Every day | ORAL | Status: DC
  Administered 2016-09-06 – 2016-09-11 (×5): 37.5 mg via ORAL
  Filled 2016-09-06 (×5): qty 1

## 2016-09-06 MED ORDER — ACETAMINOPHEN 650 MG RE SUPP: 650.0000 mg | Freq: Four times a day (QID) | RECTAL | Status: DC | PRN

## 2016-09-06 MED ORDER — FUROSEMIDE 10 MG/ML IJ SOLN
40.0000 mg | Freq: Once | INTRAMUSCULAR | Status: AC
  Administered 2016-09-06: 40 mg via INTRAVENOUS
  Filled 2016-09-06: qty 4

## 2016-09-06 MED ORDER — POTASSIUM CHLORIDE CRYS ER 20 MEQ PO TBCR
20.0000 meq | EXTENDED_RELEASE_TABLET | Freq: Every day | ORAL | Status: DC
  Administered 2016-09-06 – 2016-09-08 (×3): 20 meq via ORAL
  Filled 2016-09-06 (×3): qty 1

## 2016-09-06 MED ORDER — ONDANSETRON HCL 4 MG PO TABS: 4.0000 mg | ORAL_TABLET | Freq: Four times a day (QID) | ORAL | Status: DC | PRN

## 2016-09-06 MED ORDER — IPRATROPIUM-ALBUTEROL 0.5-2.5 (3) MG/3ML IN SOLN
3.0000 mL | Freq: Four times a day (QID) | RESPIRATORY_TRACT | Status: DC | PRN
  Administered 2016-09-09: 3 mL via RESPIRATORY_TRACT

## 2016-09-06 MED ORDER — PRAMIPEXOLE DIHYDROCHLORIDE 1 MG PO TABS
0.5000 mg | ORAL_TABLET | Freq: Every day | ORAL | Status: DC
Start: 2016-09-06 — End: 2016-09-11
  Administered 2016-09-06 – 2016-09-10 (×5): 0.5 mg via ORAL
  Filled 2016-09-06 (×5): qty 1

## 2016-09-06 MED ORDER — DILTIAZEM HCL ER COATED BEADS 120 MG PO CP24
120.0000 mg | ORAL_CAPSULE | Freq: Every day | ORAL | Status: DC
  Administered 2016-09-06: 120 mg via ORAL
  Filled 2016-09-06 (×2): qty 1

## 2016-09-06 MED ORDER — AMLODIPINE BESYLATE 5 MG PO TABS
5.0000 mg | ORAL_TABLET | Freq: Every day | ORAL | Status: DC
  Administered 2016-09-06: 5 mg via ORAL
  Filled 2016-09-06: qty 1

## 2016-09-06 MED ORDER — VITAMIN B-12 1000 MCG PO TABS
5000.0000 ug | ORAL_TABLET | Freq: Every day | ORAL | Status: DC
  Administered 2016-09-06 – 2016-09-11 (×5): 5000 ug via ORAL
  Filled 2016-09-06 (×5): qty 5

## 2016-09-06 MED ORDER — MOMETASONE FURO-FORMOTEROL FUM 200-5 MCG/ACT IN AERO
2.0000 | INHALATION_SPRAY | Freq: Two times a day (BID) | RESPIRATORY_TRACT | Status: DC
  Administered 2016-09-06 – 2016-09-11 (×11): 2 via RESPIRATORY_TRACT
  Filled 2016-09-06: qty 8.8

## 2016-09-06 MED ORDER — MAGNESIUM SULFATE 2 GM/50ML IV SOLN
2.0000 g | Freq: Once | INTRAVENOUS | Status: AC
  Administered 2016-09-06: 2 g via INTRAVENOUS
  Filled 2016-09-06: qty 50

## 2016-09-06 NOTE — Progress Notes (Signed)
Notified Dr Leslye Peer that pt had episodes of tachycardia, w/ HR up to 140's. Dr acknowledged.

## 2016-09-06 NOTE — Progress Notes (Signed)
Patient ID: Sara Mccoy, female   DOB: March 11, 1926, 82 y.o.   MRN: [id number]  [employer] PROGRESS NOTE  Sara Mccoy [facility] MPN:[id number] DOB: 03-Feb-1926 DOA: 09/05/2016 PCP: Tracie Harrier, MD  HPI/Subjective: Patient not feeling well at all. Every time she stands up her rectum prolapses. She bled a lot on Friday. Has intermittent bleeding with this. Dr. Tamala Julian surgery did want to set her up for a surgical procedure. He would like hemoglobin greater than 10. Patient complains of weakness and diarrhea.  Objective: Vitals:   09/06/16 1225 09/06/16 1307  BP: 131/61 132/62  Pulse:  80  Resp:  18  Temp:  98.2 F (36.8 C)    Filed Weights   09/05/16 2039  Weight: 59.9 kg (132 lb)    ROS: Review of Systems  Constitutional: Negative for chills and fever.  Eyes: Negative for blurred vision.  Respiratory: Negative for cough and shortness of breath.   Cardiovascular: Negative for chest pain.  Gastrointestinal: Positive for blood in stool and diarrhea. Negative for abdominal pain, constipation, nausea and vomiting.  Genitourinary: Negative for dysuria.  Musculoskeletal: Negative for joint pain.  Neurological: Positive for weakness. Negative for dizziness and headaches.   Exam: Physical Exam  Constitutional: She is oriented to person, place, and time.  HENT:  Nose: No mucosal edema.  Mouth/Throat: No oropharyngeal exudate or posterior oropharyngeal edema.  Eyes: Conjunctivae, EOM and lids are normal. Pupils are equal, round, and reactive to light.  Neck: No JVD present. Carotid bruit is not present. No edema present. No thyroid mass and no thyromegaly present.  Cardiovascular: S1 normal and S2 normal.  Exam reveals no gallop.   No murmur heard. Pulses:      Dorsalis pedis pulses are 2+ on the right side, and 2+ on the left side.  Respiratory: No respiratory distress. She has decreased breath sounds in the right lower field and the left lower field. She has  no wheezes. She has no rhonchi. She has rales in the right lower field and the left lower field.  GI: Soft. Bowel sounds are normal. There is no tenderness.  Musculoskeletal:       Right ankle: She exhibits no swelling.       Left ankle: She exhibits no swelling.  Lymphadenopathy:    She has no cervical adenopathy.  Neurological: She is alert and oriented to person, place, and time. No cranial nerve deficit.  Skin: Skin is warm. No rash noted. Nails show no clubbing.  Psychiatric: She has a normal mood and affect.      Data Reviewed: Basic Metabolic Panel:  Recent Labs Lab 09/05/16 2047 09/06/16 1104  NA 140 143  K 4.1 3.5  CL 103 112*  CO2 31 29  GLUCOSE 95 92  BUN 24* 19  CREATININE 0.99 0.78  CALCIUM 8.8* 7.2*  MG 1.9 1.6*  PHOS 3.7  --    Liver Function Tests:  Recent Labs Lab 09/05/16 2047  AST 21  ALT 16  ALKPHOS 62  BILITOT 0.7  PROT 6.0*  ALBUMIN 3.4*   CBC:  Recent Labs Lab 09/03/16 1608 09/05/16 2047 09/06/16 1104  WBC 8.9 8.5 6.6  NEUTROABS 6.4  --   --   HGB 7.6* 8.2* 8.5*  HCT 22.6* 24.7* 25.7*  MCV 109.6* 109.8* 105.6*  PLT 194 177 131*   BNP (last 3 results)  Recent Labs  04/25/16 0106 05/15/16 1830  BNP 318.0* 363.0*     Scheduled Meds: . calcium-vitamin D  1 tablet  Oral BID  . cholecalciferol  1,000 Units Oral Daily  . diltiazem  120 mg Oral Daily  . ferrous sulfate  325 mg Oral Q breakfast  . magnesium sulfate 1 - 4 g bolus IVPB  2 g Intravenous Once  . metoprolol  50 mg Oral BID  . mometasone-formoterol  2 puff Inhalation BID  . multivitamin with minerals  1 tablet Oral Daily  . pantoprazole  40 mg Oral QAC breakfast  . potassium chloride  20 mEq Oral Daily  . pramipexole  0.5 mg Oral QHS  . sodium chloride flush  3 mL Intravenous Q12H  . venlafaxine XR  37.5 mg Oral Q breakfast  . vitamin B-12  5,000 mcg Oral Daily  . vitamin C  1,000 mg Oral Daily  . vitamin E  400 Units Oral Daily     Assessment/Plan:  1. Patient admitted with symptomatic anemia. Patient's lower GI bleed is from rectal prolapse irritation. Looking back at old iron study she has anemia of chronic disease and a macrocytic anemia. She was given 2 units of packed red blood cells and her hemoglobin went from 8.2 to 8.5. I will stop IV fluid hydration to prevent from diluting her further. She could have a myelodysplastic syndrome but patient refused a bone marrow as outpatient. I will recheck a hemoglobin tomorrow morning after giving Lasix today. Since I do think she is fluid overloaded I will hold off on a third unit of blood today. Likely can be given tomorrow. 2. Rectal prolapse. Surgical consultation. Allow the patient to eat today until surgery planned. 3. Paroxysmal atrial fibrillation. Change Norvasc to Cardizem. On Lopressor already. 4. Hypertension. Switch Norvasc to Cardizem and continue Lopressor 5. Anxiety on Xanax and Effexor 6. Chronic respiratory failure and COPD. Continue Advair and DuoNeb and oxygen supplementation. 7. Hypomagnesemia replace magnesium IV 8. Hypokalemia replace potassium orally 9. GERD on Prilosec  Code Status:     Code Status Orders        Start     Ordered   09/06/16 0206  Do not attempt resuscitation (DNR)  Continuous    Question Answer Comment  In the event of cardiac or respiratory ARREST Do not call a "code blue"   In the event of cardiac or respiratory ARREST Do not perform Intubation, CPR, defibrillation or ACLS   In the event of cardiac or respiratory ARREST Use medication by any route, position, wound care, and other measures to relive pain and suffering. May use oxygen, suction and manual treatment of airway obstruction as needed for comfort.      09/06/16 0205    Code Status History    Date Active Date Inactive Code Status Order ID Comments User Context   09/06/2016  2:05 AM 09/06/2016  1:01 PM DNR [id number]  [name], DO Inpatient   09/06/2016   1:40 AM 09/06/2016  2:05 AM Full Code [id number]  [name], DO Inpatient   09/06/2016  1:40 AM 09/06/2016  1:40 AM DNR [id number]  [name], DO Inpatient   07/25/2016  8:20 AM 07/27/2016  5:29 PM Full Code [id number]  [name], MD Inpatient   07/22/2016 10:49 AM 07/24/2016  7:04 PM DNR [id number]  [name], MD Inpatient   07/21/2016 10:33 PM 07/22/2016 10:48 AM Full Code [id number]  [name], DO ED   07/21/2016 10:33 PM 07/21/2016 10:33 PM DNR [id number]  [name], DO ED   04/25/2016  3:51 AM 04/26/2016  7:38 PM DNR [id number]  [name],  MD Inpatient   04/25/2016  3:29 AM 04/25/2016  3:29 AM Full Code [id number]  [name], MD ED    Questions for Most Recent Historical Code Status (Order [id number])    Question Answer Comment   In the event of cardiac or respiratory ARREST Do not call a "code blue"    In the event of cardiac or respiratory ARREST Do not perform Intubation, CPR, defibrillation or ACLS    In the event of cardiac or respiratory ARREST Use medication by any route, position, wound care, and other measures to relive pain and suffering. May use oxygen, suction and manual treatment of airway obstruction as needed for comfort.         Advance Directive Documentation   Flowsheet Row Most Recent Value  Type of Advance Directive  Healthcare Power of Attorney, Living will  Pre-existing out of facility DNR order (yellow form or pink MOST form)  No data  "MOST" Form in Place?  No data     Family Communication: Family at the bedside  Disposition Plan: To be determined  Consultants:  Gen. Surgery  Cardiology consultation put in by admitting physician  Pulmonary put in by admitting physician  Time spent: 35 minutes  Loletha Grayer  [employer]

## 2016-09-06 NOTE — Care Management Important Message (Signed)
Important Message  Patient Details  Name: Sara Mccoy MRN: [id number] Date of Birth: 1925-12-28   Medicare Important Message Given:  Yes    Annaleigha Woo A, RN 09/06/2016, 4:03 PM

## 2016-09-06 NOTE — Progress Notes (Signed)
Sara Mccoy is a 82 y.o. female  [id number]  Primary Cardiologist: Neoma Laming Reason for Consultation: Preoperative clearance  HPI: This is a 82 year old white female who presented to the hospital with weakness dizziness and shortness of breath was found to have rectal prolapse with lower GI bleed. She is needing surgery for rectal prolapse with Dr. Rochel Brome. She denies any chest pain but has occasional lightheadedness and palpitation where she says her heart rate races. She was noted to have nonsustained ventricular tachycardia.  Review of Systems: No chest pain no orthopnea PND or leg swelling   Past Medical History:  Diagnosis Date  . Anemia   . COPD (chronic obstructive pulmonary disease) ([place])   . Lung cancer, upper lobe ([place]) 1997   right upper lobe  . Prolapse of intestine   . Raynaud's disease     Medications Prior to Admission  Medication Sig Dispense Refill  . ALPRAZolam (XANAX) 0.5 MG tablet Take 1 tablet (0.5 mg total) by mouth at bedtime as needed for anxiety. 10 tablet 0  . amLODipine (NORVASC) 5 MG tablet Take 5 mg by mouth daily.    . Ascorbic Acid (VITAMIN C) 1000 MG tablet Take 1,000 mg by mouth daily.    Marland Kitchen b complex vitamins capsule Take 1 capsule by mouth daily. Reported on 06/08/2016    . Calcium Carb-Cholecalciferol (CALCIUM + D3) 600-200 MG-UNIT TABS Take 1 tablet by mouth 2 (two) times daily. Reported on 06/08/2016    . cholecalciferol (VITAMIN D) 1000 units tablet Take 1,000 Units by mouth daily.    . Cyanocobalamin (VITAMIN B-12) 5000 MCG SUBL Place 1 tablet (5,000 mcg total) under the tongue daily. Daily 30 tablet 0  . ferrous sulfate 325 (65 FE) MG tablet Take 1 tablet (325 mg total) by mouth daily with breakfast. 30 tablet 0  . Fluticasone-Salmeterol (ADVAIR) 500-50 MCG/DOSE AEPB Inhale 1 puff into the lungs 2 (two) times daily.    Marland Kitchen ipratropium-albuterol (DUONEB) 0.5-2.5 (3) MG/3ML SOLN Take 3 mLs by nebulization every 6 (six) hours as  needed. Reported on 06/08/2016    . metoprolol (LOPRESSOR) 50 MG tablet Take 50 mg by mouth 2 (two) times daily.    . Multiple Vitamin (MULTIVITAMIN WITH MINERALS) TABS tablet Take 1 tablet by mouth daily. Reported on 06/08/2016    . omeprazole (PRILOSEC) 40 MG capsule Take 40 mg by mouth daily.    Marland Kitchen oxyCODONE-acetaminophen (PERCOCET/ROXICET) 5-325 MG tablet Take 1 tablet by mouth 2 (two) times daily as needed for severe pain. 10 tablet 0  . pramipexole (MIRAPEX) 0.5 MG tablet Take 0.5 mg by mouth at bedtime.     Marland Kitchen venlafaxine XR (EFFEXOR-XR) 37.5 MG 24 hr capsule Take 37.5 mg by mouth daily with breakfast.    . vitamin E 400 UNIT capsule Take 400 Units by mouth daily.       Marland Kitchen amLODipine  5 mg Oral Daily  . calcium-vitamin D  1 tablet Oral BID  . cholecalciferol  1,000 Units Oral Daily  . enoxaparin (LOVENOX) injection  40 mg Subcutaneous QHS  . ferrous sulfate  325 mg Oral Q breakfast  . metoprolol  50 mg Oral BID  . mometasone-formoterol  2 puff Inhalation BID  . multivitamin with minerals  1 tablet Oral Daily  . pantoprazole  40 mg Oral QAC breakfast  . pramipexole  0.5 mg Oral QHS  . sodium chloride flush  3 mL Intravenous Q12H  . venlafaxine XR  37.5 mg Oral Q  breakfast  . vitamin B-12  5,000 mcg Oral Daily  . vitamin C  1,000 mg Oral Daily  . vitamin E  400 Units Oral Daily    Infusions: . sodium chloride 75 mL/hr at 09/06/16 1032    No Known Allergies  Social History   Social History  . Marital status: Widowed    Spouse name: N/A  . Number of children: N/A  . Years of education: N/A   Occupational History  . Not on file.   Social History Main Topics  . Smoking status: Former Smoker    Packs/day: 2.00    Years: 46.00  . Smokeless tobacco: Never Used  . Alcohol use No  . Drug use: No  . Sexual activity: Not on file     Comment: 25 years ago   Other Topics Concern  . Not on file   Social History Narrative  . No narrative on file    Family History   Problem Relation Age of Onset  . Heart attack Son   . Diabetes Son   . Diabetes Mother   . Hypertension Daughter     PHYSICAL EXAM: Vitals:   09/06/16 0647 09/06/16 1010  BP: 122/61 119/60  Pulse: 86 79  Resp: (!) 24 20  Temp: 98.2 F (36.8 C) 98.2 F (36.8 C)     Intake/Output Summary (Last 24 hours) at 09/06/16 1100 Last data filed at 09/06/16 1053  Gross per 24 hour  Intake              544 ml  Output              150 ml  Net              394 ml    General:  Well appearing. No respiratory difficulty HEENT: normal Neck: supple. no JVD. Carotids 2+ bilat; no bruits. No lymphadenopathy or thryomegaly appreciated. Cor: PMI nondisplaced. Regular rate & rhythm. No rubs, gallops or murmurs. Lungs: clear Abdomen: soft, nontender, nondistended. No hepatosplenomegaly. No bruits or masses. Good bowel sounds. Extremities: no cyanosis, clubbing, rash, edema Neuro: alert & oriented x 3, cranial nerves grossly intact. moves all 4 extremities w/o difficulty. Affect pleasant.  EGB:TDVVO rhythm with frequent PVCs and right bundle branch block no acute ST elevation with monitor showing non-sustained ventricular tachycardia  Results for orders placed or performed during the hospital encounter of 09/05/16 (from the past 24 hour(s))  Comprehensive metabolic panel     Status: Abnormal   Collection Time: 09/05/16  8:47 PM  Result Value Ref Range   Sodium 140 135 - 145 mmol/L   Potassium 4.1 3.5 - 5.1 mmol/L   Chloride 103 101 - 111 mmol/L   CO2 31 22 - 32 mmol/L   Glucose, Bld 95 65 - 99 mg/dL   BUN 24 (H) 6 - 20 mg/dL   Creatinine, Ser 0.99 0.44 - 1.00 mg/dL   Calcium 8.8 (L) 8.9 - 10.3 mg/dL   Total Protein 6.0 (L) 6.5 - 8.1 g/dL   Albumin 3.4 (L) 3.5 - 5.0 g/dL   AST 21 15 - 41 U/L   ALT 16 14 - 54 U/L   Alkaline Phosphatase 62 38 - 126 U/L   Total Bilirubin 0.7 0.3 - 1.2 mg/dL   GFR calc non Af Amer 49 (L) >60 mL/min   GFR calc Af Amer 57 (L) >60 mL/min   Anion gap 6 5 - 15   CBC     Status: Abnormal  Collection Time: 09/05/16  8:47 PM  Result Value Ref Range   WBC 8.5 3.6 - 11.0 K/uL   RBC 2.25 (L) 3.80 - 5.20 MIL/uL   Hemoglobin 8.2 (L) 12.0 - 16.0 g/dL   HCT 24.7 (L) 35.0 - 47.0 %   MCV 109.8 (H) 80.0 - 100.0 fL   MCH 36.4 (H) 26.0 - 34.0 pg   MCHC 33.1 32.0 - 36.0 g/dL   RDW 21.5 (H) 11.5 - 14.5 %   Platelets 177 150 - 440 K/uL  Magnesium     Status: None   Collection Time: 09/05/16  8:47 PM  Result Value Ref Range   Magnesium 1.9 1.7 - 2.4 mg/dL  Phosphorus     Status: None   Collection Time: 09/05/16  8:47 PM  Result Value Ref Range   Phosphorus 3.7 2.5 - 4.6 mg/dL  Prepare RBC     Status: None   Collection Time: 09/05/16 10:16 PM  Result Value Ref Range   Order Confirmation ORDER PROCESSED BY BLOOD BANK    No results found.   ASSESSMENT AND PLAN:Severe anemia/lower GI bleed secondary to rectal prolapse requiring surgery with hemoglobin 8.2 but received 2 units of blood and follow-up hemoglobin is pending. Patient did have a run of nonsustained VT and will get echocardiogram to further evaluate ejection fraction. Patient does need the surgery right away thus advise proceeding with surgery with low to moderate risk. Advise getting the hemoglobin over 10 prior to surgery.  Yamil Oelke A

## 2016-09-06 NOTE — H&P (Signed)
[place] @ [facility] Admission History and Physical [name], D.O.  ---------------------------------------------------------------------------------------------------------------------   PATIENT NAME: Sara Mccoy MR#: [id number] DATE OF BIRTH: 05-Aug-1926 DATE OF ADMISSION: 09/05/2016 PRIMARY CARE PHYSICIAN: Tracie Harrier, MD  REQUESTING/REFERRING PHYSICIAN: ED Dr. Marcelene Butte  CHIEF COMPLAINT: Chief Complaint  Patient presents with  . Rectal Bleeding    HISTORY OF PRESENT ILLNESS: Sara Mccoy is a 82 y.o. female with a known history of Rectal prolapse presents to the emergency department complaining of generalized weakness, dizziness, lightheadedness and shortness of breath with exertion.  Patient is known to have a rectal prolapse with lower GI bleeding. She has had a surgical consultation with Dr. Rochel Brome who had planned to surgically correct her rectal prolapse once her hemoglobin had stabilized. She had been scheduled to receive transfusion of blood products yesterday but was unable to keep her appointment secondary to rectal bleeding, loose stools and feeling generally unwell. Hospitalist service was contacted for admission.  Otherwise there has been no change in status. Patient has been taking medication as prescribed and there has been no recent change in medication or diet.  There has been no recent illness, travel or sick contacts.    Patient denies fevers/chills, chest pain, N/V/C/D, abdominal pain, dysuria/frequency, changes in mental status.    PAST MEDICAL HISTORY: Past Medical History:  Diagnosis Date  . Anemia   . COPD (chronic obstructive pulmonary disease) ([place])   . Lung cancer, upper lobe ([place]) 1997   right upper lobe  . Prolapse of intestine   . Raynaud's disease       PAST SURGICAL HISTORY: Past Surgical History:  Procedure Laterality Date  . ABDOMINAL HYSTERECTOMY    . CESAREAN SECTION    . CHOLECYSTECTOMY    .  FLEXIBLE SIGMOIDOSCOPY N/A 04/25/2016   Procedure: FLEXIBLE SIGMOIDOSCOPY;  Surgeon: Lucilla Lame, MD;  Location: [facility] ENDOSCOPY;  Service: Endoscopy;  Laterality: N/A;  . JOINT REPLACEMENT    . LUNG SURGERY Right 1997   RUL removed      SOCIAL HISTORY: Social History  Substance Use Topics  . Smoking status: Former Smoker    Packs/day: 2.00    Years: 46.00  . Smokeless tobacco: Never Used  . Alcohol use No      FAMILY HISTORY: Family History  Problem Relation Age of Onset  . Heart attack Son   . Diabetes Son   . Diabetes Mother   . Hypertension Daughter      MEDICATIONS AT HOME: Prior to Admission medications   Medication Sig Start Date End Date Taking? Authorizing Provider  ALPRAZolam Duanne Moron) 0.5 MG tablet Take 1 tablet (0.5 mg total) by mouth at bedtime as needed for anxiety. 07/27/16  Yes Srikar Sudini, MD  amLODipine (NORVASC) 5 MG tablet Take 5 mg by mouth daily.   Yes Historical Provider, MD  Ascorbic Acid (VITAMIN C) 1000 MG tablet Take 1,000 mg by mouth daily.   Yes Historical Provider, MD  b complex vitamins capsule Take 1 capsule by mouth daily. Reported on 06/08/2016   Yes Historical Provider, MD  Calcium Carb-Cholecalciferol (CALCIUM + D3) 600-200 MG-UNIT TABS Take 1 tablet by mouth 2 (two) times daily. Reported on 06/08/2016   Yes Historical Provider, MD  cholecalciferol (VITAMIN D) 1000 units tablet Take 1,000 Units by mouth daily.   Yes Historical Provider, MD  Cyanocobalamin (VITAMIN B-12) 5000 MCG SUBL Place 1 tablet (5,000 mcg total) under the tongue daily. Daily 07/27/16  Yes [name], MD  ferrous sulfate 325 (65  FE) MG tablet Take 1 tablet (325 mg total) by mouth daily with breakfast. 07/27/16  Yes Srikar Sudini, MD  Fluticasone-Salmeterol (ADVAIR) 500-50 MCG/DOSE AEPB Inhale 1 puff into the lungs 2 (two) times daily.   Yes Historical Provider, MD  ipratropium-albuterol (DUONEB) 0.5-2.5 (3) MG/3ML SOLN Take 3 mLs by nebulization every 6 (six) hours as needed.  Reported on 06/08/2016   Yes Historical Provider, MD  metoprolol (LOPRESSOR) 50 MG tablet Take 50 mg by mouth 2 (two) times daily.   Yes Historical Provider, MD  Multiple Vitamin (MULTIVITAMIN WITH MINERALS) TABS tablet Take 1 tablet by mouth daily. Reported on 06/08/2016   Yes Historical Provider, MD  omeprazole (PRILOSEC) 40 MG capsule Take 40 mg by mouth daily.   Yes Historical Provider, MD  oxyCODONE-acetaminophen (PERCOCET/ROXICET) 5-325 MG tablet Take 1 tablet by mouth 2 (two) times daily as needed for severe pain. 07/27/16  Yes Srikar Sudini, MD  pramipexole (MIRAPEX) 0.5 MG tablet Take 0.5 mg by mouth at bedtime.    Yes Historical Provider, MD  venlafaxine XR (EFFEXOR-XR) 37.5 MG 24 hr capsule Take 37.5 mg by mouth daily with breakfast.   Yes Historical Provider, MD  vitamin E 400 UNIT capsule Take 400 Units by mouth daily.   Yes Historical Provider, MD      DRUG ALLERGIES: No Known Allergies   REVIEW OF SYSTEMS: CONSTITUTIONAL: Positive fatigue, weakness, negative fever, chills, weight gain/loss, headache EYES: No blurry or double vision. ENT: No tinnitus, postnasal drip, redness or soreness of the oropharynx. RESPIRATORY: Positive dyspnea, negative cough, wheeze, hemoptysis. CARDIOVASCULAR: No chest pain, orthopnea, palpitations, syncope. GASTROINTESTINAL: No nausea, vomiting, constipation, diarrhea, abdominal pain. No hematemesis,Positive bright red blood per rectum GENITOURINARY: No dysuria, frequency, hematuria. ENDOCRINE: No polyuria or nocturia. No heat or cold intolerance. HEMATOLOGY: No anemia, bruising, bleeding. INTEGUMENTARY: No rashes, ulcers, lesions. MUSCULOSKELETAL: No pain, arthritis, swelling, gout. NEUROLOGIC: No numbness, tingling, weakness or ataxia. No seizure-type activity. PSYCHIATRIC: No anxiety, depression, insomnia.  PHYSICAL EXAMINATION: VITAL SIGNS: Blood pressure (!) 101/58, pulse 92, temperature 98.4 F (36.9 C), temperature source Oral, resp. rate  (!) 25, height '5\' 1"'$  (1.549 m), weight 59.9 kg (132 lb), SpO2 100 %.  GENERAL: 82 y.o.-year-old pale white female patient, well-developed, well-nourished lying in the bed in no acute distress.  Pleasant and cooperative.   HEENT: Head atraumatic, normocephalic. Pupils equal, round, reactive to light and accommodation. No scleral icterus. Extraocular muscles intact. Oropharynx is clear. Mucus membranes moist. NECK: Supple, full range of motion. No JVD, no bruit heard. No cervical lymphadenopathy. CHEST: Normal breath sounds bilaterally. No wheezing, rales, rhonchi or crackles. No use of accessory muscles of respiration.  No reproducible chest wall tenderness.  CARDIOVASCULAR: S1, S2 normal, irregular. No murmurs, rubs, or gallops appreciated. Cap refill <2 seconds. ABDOMEN: Soft, nontender, nondistended. No rebound, guarding, rigidity. Normoactive bowel sounds present in all four quadrants. No organomegaly or mass. EXTREMITIES: Full range of motion. No pedal edema, cyanosis, or clubbing. NEUROLOGIC: Cranial nerves II through XII are grossly intact with no focal sensorimotor deficit. Muscle strength 5/5 in all extremities. Sensation intact. Gait not checked. PSYCHIATRIC: The patient is alert and oriented x 3. Normal affect, mood, thought content. SKIN: Warm, dry, and intact without obvious rash, lesion, or ulcer. Lower extremity petechiae  LABORATORY PANEL:  CBC  Recent Labs Lab 09/05/16 2047  WBC 8.5  HGB 8.2*  HCT 24.7*  PLT 177   ----------------------------------------------------------------------------------------------------------------- Chemistries  Recent Labs Lab 09/05/16 2047  NA 140  K 4.1  CL  103  CO2 31  GLUCOSE 95  BUN 24*  CREATININE 0.99  CALCIUM 8.8*  AST 21  ALT 16  ALKPHOS 62  BILITOT 0.7   ------------------------------------------------------------------------------------------------------------------ Cardiac Enzymes No results for input(s): TROPONINI  in the last 168 hours. ------------------------------------------------------------------------------------------------------------------  RADIOLOGY: No results found.  IMPRESSION AND PLAN:  This is a 82 y.o. female with a history of Anemia secondary to lower GI bleed, rectal prolapse, COPD, hypertension, anxiety, COPD and GERD Raynaud's and remote lung cancer now being admitted with: 1. Symptomatic anemia secondary to lower GI bleed-admit to inpatient for transfusion, serial CBCs. 2. Rectal prolapse-surgical consultation has been requested with Dr. Rochel Brome for consideration of repair. Cardiology and pulmonary preop evaluations have also been requested. Patient will be nothing by mouth after midnight 3. History of hypertension-continue Norvasc, Lopressor 4. History of anxiety-continue Xanax and Effexor 5. History of COPD-continue Advair, DuoNeb's 6. History of GERD-continue Prilosec Continue patient's home medications   Diet/Nutrition: Nothing by mouth after midnight Fluids: IV normal saline DVT Px: Lovenox, SCDs and early ambulation Code Status: DNR patient confirmed  All the records are reviewed and case discussed with ED provider. Management plans discussed with the patient and/or family who express understanding and agree with plan of care.   TOTAL TIME TAKING CARE OF THIS PATIENT: 60 minutes.   Tahiri Shareef D.O. on 09/06/2016 at 1:53 AM Between 7am to 6pm - Pager - [phone] After 6pm go to [email or web address] - [employer] [facility]ists Office [phone] CC: Primary care physician; Tracie Harrier, MD     Note: This dictation was prepared with Dragon dictation along with smaller phrase technology. Any transcriptional errors that result from this process are unintentional.

## 2016-09-06 NOTE — Progress Notes (Signed)
MD notified of v-tach run; orders for magnesium level in am. Also verified orders for blood transfusion.

## 2016-09-07 ENCOUNTER — Inpatient Hospital Stay: Payer: PPO | Attending: Hematology and Oncology

## 2016-09-07 DIAGNOSIS — D472 Monoclonal gammopathy: Secondary | ICD-10-CM | POA: Diagnosis not present

## 2016-09-07 LAB — CBC
HEMATOCRIT: 28.4 % — AB (ref 35.0–47.0)
Hemoglobin: 9.5 g/dL — ABNORMAL LOW (ref 12.0–16.0)
MCH: 35.2 pg — AB (ref 26.0–34.0)
MCHC: 33.3 g/dL (ref 32.0–36.0)
MCV: 105.6 fL — ABNORMAL HIGH (ref 80.0–100.0)
Platelets: 139 10*3/uL — ABNORMAL LOW (ref 150–440)
RBC: 2.69 MIL/uL — ABNORMAL LOW (ref 3.80–5.20)
RDW: 22.5 % — AB (ref 11.5–14.5)
WBC: 8.2 10*3/uL (ref 3.6–11.0)

## 2016-09-07 LAB — MAGNESIUM: Magnesium: 2 mg/dL (ref 1.7–2.4)

## 2016-09-07 LAB — MULTIPLE MYELOMA PANEL, SERUM
Albumin SerPl Elph-Mcnc: 3.3 g/dL (ref 2.9–4.4)
Albumin/Glob SerPl: 1.4 (ref 0.7–1.7)
Alpha 1: 0.3 g/dL (ref 0.0–0.4)
Alpha2 Glob SerPl Elph-Mcnc: 0.6 g/dL (ref 0.4–1.0)
B-Globulin SerPl Elph-Mcnc: 0.7 g/dL (ref 0.7–1.3)
Gamma Glob SerPl Elph-Mcnc: 0.8 g/dL (ref 0.4–1.8)
Globulin, Total: 2.4 g/dL (ref 2.2–3.9)
IgA: 175 mg/dL (ref 64–422)
IgG (Immunoglobin G), Serum: 589 mg/dL — ABNORMAL LOW (ref 700–1600)
IgM, Serum: 427 mg/dL — ABNORMAL HIGH (ref 26–217)
M Protein SerPl Elph-Mcnc: 0.3 g/dL — ABNORMAL HIGH
Total Protein ELP: 5.7 g/dL — ABNORMAL LOW (ref 6.0–8.5)

## 2016-09-07 LAB — BASIC METABOLIC PANEL
Anion gap: 5 (ref 5–15)
BUN: 25 mg/dL — AB (ref 6–20)
CALCIUM: 8.6 mg/dL — AB (ref 8.9–10.3)
CO2: 35 mmol/L — AB (ref 22–32)
CREATININE: 0.88 mg/dL (ref 0.44–1.00)
Chloride: 102 mmol/L (ref 101–111)
GFR calc non Af Amer: 57 mL/min — ABNORMAL LOW (ref >60)
Glucose, Bld: 96 mg/dL (ref 65–99)
Potassium: 4.3 mmol/L (ref 3.5–5.1)
Sodium: 142 mmol/L (ref 135–145)

## 2016-09-07 LAB — ECHOCARDIOGRAM COMPLETE
HEIGHTINCHES: 61 in
Weight: 2112 oz

## 2016-09-07 LAB — PREPARE RBC (CROSSMATCH)

## 2016-09-07 MED ORDER — ENSURE ENLIVE PO LIQD
237.0000 mL | Freq: Two times a day (BID) | ORAL | Status: DC
  Administered 2016-09-07 – 2016-09-11 (×7): 237 mL via ORAL

## 2016-09-07 MED ORDER — ACETAMINOPHEN 325 MG PO TABS
650.0000 mg | ORAL_TABLET | Freq: Four times a day (QID) | ORAL | Status: DC | PRN
Start: 2016-09-07 — End: 2016-09-11

## 2016-09-07 MED ORDER — ACETAMINOPHEN 650 MG RE SUPP: 650.0000 mg | Freq: Four times a day (QID) | RECTAL | Status: DC | PRN

## 2016-09-07 MED ORDER — FUROSEMIDE 10 MG/ML IJ SOLN
20.0000 mg | Freq: Once | INTRAMUSCULAR | Status: AC
  Administered 2016-09-07: 20 mg via INTRAVENOUS
  Filled 2016-09-07: qty 2

## 2016-09-07 MED ORDER — SODIUM CHLORIDE 0.9 % IV SOLN
Freq: Once | INTRAVENOUS | Status: AC
  Administered 2016-09-07: 15:00:00 via INTRAVENOUS

## 2016-09-07 NOTE — Progress Notes (Signed)
CCMD notified me that the pt had a 2.17 pause.  Dr Humphrey Rolls notified and order given to keep the metoprolol but to discontinue cardiazem

## 2016-09-07 NOTE — Progress Notes (Signed)
Initial Nutrition Assessment  DOCUMENTATION CODES:   Severe malnutrition in context of chronic illness  INTERVENTION:  -Monitor intake -Recommend Ensure Enlive po BID, each supplement provides 350 kcal and 20 grams of protein    NUTRITION DIAGNOSIS:   Malnutrition related to chronic illness as evidenced by percent weight loss, severe depletion of muscle mass.    GOAL:   Patient will meet greater than or equal to 90% of their needs    MONITOR:   PO intake, Supplement acceptance  REASON FOR ASSESSMENT:   Malnutrition Screening Tool    ASSESSMENT:     82 y.o female admitted with anemia and lower GI bleed secondary to rectal prolapse.    Past Medical History:  Diagnosis Date  . Anemia   . COPD (chronic obstructive pulmonary disease) ([place])   . Lung cancer, upper lobe ([place]) 1997   right upper lobe  . Prolapse of intestine   . Raynaud's disease    Pt reports appetite has been decreased for the last 6 months. Reports eats a few bites of meals for the last 6 months, reports gets full quickly.  Reports drinks boost and yogurt each morning but also likes ensure.    Pt reports over 2 years ago weighed 228 pounds, current wt of 132 pounds (42% wt loss in the last 2 years)  Medications reviewed: calcium and vit D, fe sulfate, MVI, protonix, KCL, vit B 12, Vit C, Vit E  Labs reviewed: Hgb 9.5  Nutrition-Focused physical exam completed. Findings are  Moderate fat depletion, moderate to severe muscle depletion, and mild edema.     Diet Order:  Diet regular Room service appropriate? Yes; Fluid consistency: Thin  Skin:  Reviewed, no issues  Last BM:  9/30  Height:   Ht Readings from Last 1 Encounters:  09/05/16 '5\' 1"'$  (1.549 m)    Weight:   Wt Readings from Last 1 Encounters:  09/05/16 132 lb (59.9 kg)    Ideal Body Weight:  47.72 kg  BMI:  Body mass index is 24.94 kg/m.  Estimated Nutritional Needs:   Kcal:  1400-1700 calories  Protein:  60-72  gm  Fluid:  >/= 1.4L  EDUCATION NEEDS:   No education needs identified at this time  Maikayla Beggs B. Zenia Resides, [place], [place] (pager) Weekend/On-Call pager [phone])

## 2016-09-07 NOTE — Consult Note (Signed)
Reason for Consult: Rectal prolapse Referring Physician: Ubaldo Glassing Hugelmeyer DO  Sara Mccoy is an 82 y.o. female.  HPI: She has history of rectal prolapse dating back to May of this year. She had flexible sigmoidoscopy which demonstrated no polyp or tumor. She has continued to have problems with prolapse. She does note that when she stands up her rectum falls out and usually when she sits still or lays down the rectum spontaneously reduces. She has also been seeing some blood on toilet tissue. She recently had office consultation in August however she was admitted emergently due to severe anemia and had 2 blood transfusions and developed heart failure. She was discharged and later returned to the office for follow-up visit and was again found to be anemic. She had hematology consult last week with a tentative plan for outpatient blood transfusions and subsequent surgery. She however was admitted emergently to the hospital this time due to profound weakness and dizziness and some persistent bleeding. Since admission she has received 2 blood transfusions and has plan for another transfusion. She did have cardiology consultation yesterday and was reported she could proceed with surgery with low to moderate risk. The cardiologist recommended transfusion up to hemoglobin over 10. She reports that after 2 blood transfusions she does feel somewhat stronger. She does report several days ago she did have some frequent diarrhea. As of yesterday the diarrhea appears to be resolved. She reports in the hospital she has been walking to and from the bed and the commode. She has not done any walking in the hallway. She has taken solid food and tolerating that well.  Past Medical History:  Diagnosis Date  . Anemia   . COPD (chronic obstructive pulmonary disease) ([place])   . Lung cancer, upper lobe ([place]) 1997   right upper lobe  . Prolapse of intestine   . Raynaud's disease    She has had a myelodysplastic  syndrome with severe microcytic anemia requiring multiple blood transfusions and did have hematology consultation last week. Has not had bone marrow biopsy.   Past Surgical History:  Procedure Laterality Date  . ABDOMINAL HYSTERECTOMY    . CESAREAN SECTION    . CHOLECYSTECTOMY    . FLEXIBLE SIGMOIDOSCOPY N/A 04/25/2016   Procedure: FLEXIBLE SIGMOIDOSCOPY;  Surgeon: Lucilla Lame, MD;  Location: [facility] ENDOSCOPY;  Service: Endoscopy;  Laterality: N/A;  . JOINT REPLACEMENT    . LUNG SURGERY Right 1997   RUL removed    Family History  Problem Relation Age of Onset  . Heart attack Son   . Diabetes Son   . Diabetes Mother   . Hypertension Daughter     Social History:  reports that she has quit smoking. She has a 92.00 pack-year smoking history. She has never used smokeless tobacco. She reports that she does not drink alcohol or use drugs.  Allergies: No Known Allergies  Medications: I have reviewed the patient's current medications.  Results for orders placed or performed during the hospital encounter of 09/05/16 (from the past 48 hour(s))  Comprehensive metabolic panel     Status: Abnormal   Collection Time: 09/05/16  8:47 PM  Result Value Ref Range   Sodium 140 135 - 145 mmol/L   Potassium 4.1 3.5 - 5.1 mmol/L   Chloride 103 101 - 111 mmol/L   CO2 31 22 - 32 mmol/L   Glucose, Bld 95 65 - 99 mg/dL   BUN 24 (H) 6 - 20 mg/dL   Creatinine, Ser 0.99 0.44 -  1.00 mg/dL   Calcium 8.8 (L) 8.9 - 10.3 mg/dL   Total Protein 6.0 (L) 6.5 - 8.1 g/dL   Albumin 3.4 (L) 3.5 - 5.0 g/dL   AST 21 15 - 41 U/L   ALT 16 14 - 54 U/L   Alkaline Phosphatase 62 38 - 126 U/L   Total Bilirubin 0.7 0.3 - 1.2 mg/dL   GFR calc non Af Amer 49 (L) >60 mL/min   GFR calc Af Amer 57 (L) >60 mL/min    Comment: (NOTE) The eGFR has been calculated using the CKD EPI equation. This calculation has not been validated in all clinical situations. eGFR's persistently <60 mL/min signify possible Chronic  Kidney Disease.    Anion gap 6 5 - 15  CBC     Status: Abnormal   Collection Time: 09/05/16  8:47 PM  Result Value Ref Range   WBC 8.5 3.6 - 11.0 K/uL   RBC 2.25 (L) 3.80 - 5.20 MIL/uL   Hemoglobin 8.2 (L) 12.0 - 16.0 g/dL   HCT 24.7 (L) 35.0 - 47.0 %   MCV 109.8 (H) 80.0 - 100.0 fL   MCH 36.4 (H) 26.0 - 34.0 pg   MCHC 33.1 32.0 - 36.0 g/dL   RDW 21.5 (H) 11.5 - 14.5 %   Platelets 177 150 - 440 K/uL  Magnesium     Status: None   Collection Time: 09/05/16  8:47 PM  Result Value Ref Range   Magnesium 1.9 1.7 - 2.4 mg/dL  Phosphorus     Status: None   Collection Time: 09/05/16  8:47 PM  Result Value Ref Range   Phosphorus 3.7 2.5 - 4.6 mg/dL  Prepare RBC     Status: None   Collection Time: 09/05/16 10:16 PM  Result Value Ref Range   Order Confirmation ORDER PROCESSED BY BLOOD BANK   Basic metabolic panel     Status: Abnormal   Collection Time: 09/06/16 11:04 AM  Result Value Ref Range   Sodium 143 135 - 145 mmol/L    Comment: ELECTROLYTES REPEATED KBH   Potassium 3.5 3.5 - 5.1 mmol/L   Chloride 112 (H) 101 - 111 mmol/L   CO2 29 22 - 32 mmol/L   Glucose, Bld 92 65 - 99 mg/dL   BUN 19 6 - 20 mg/dL   Creatinine, Ser 0.78 0.44 - 1.00 mg/dL   Calcium 7.2 (L) 8.9 - 10.3 mg/dL   GFR calc non Af Amer >60 >60 mL/min   GFR calc Af Amer >60 >60 mL/min    Comment: (NOTE) The eGFR has been calculated using the CKD EPI equation. This calculation has not been validated in all clinical situations. eGFR's persistently <60 mL/min signify possible Chronic Kidney Disease.    Anion gap 2 (L) 5 - 15  Magnesium     Status: Abnormal   Collection Time: 09/06/16 11:04 AM  Result Value Ref Range   Magnesium 1.6 (L) 1.7 - 2.4 mg/dL  CBC     Status: Abnormal   Collection Time: 09/06/16 11:04 AM  Result Value Ref Range   WBC 6.6 3.6 - 11.0 K/uL   RBC 2.44 (L) 3.80 - 5.20 MIL/uL   Hemoglobin 8.5 (L) 12.0 - 16.0 g/dL   HCT 25.7 (L) 35.0 - 47.0 %   MCV 105.6 (H) 80.0 - 100.0 fL   MCH 34.8  (H) 26.0 - 34.0 pg   MCHC 33.0 32.0 - 36.0 g/dL   RDW 22.6 (H) 11.5 - 14.5 %   Platelets  131 (L) 150 - 440 K/uL  Protime-INR     Status: Abnormal   Collection Time: 09/06/16 11:04 AM  Result Value Ref Range   Prothrombin Time 16.4 (H) 11.4 - 15.2 seconds   INR 1.31   CBC     Status: Abnormal   Collection Time: 09/07/16  5:26 AM  Result Value Ref Range   WBC 8.2 3.6 - 11.0 K/uL   RBC 2.69 (L) 3.80 - 5.20 MIL/uL   Hemoglobin 9.5 (L) 12.0 - 16.0 g/dL   HCT 28.4 (L) 35.0 - 47.0 %   MCV 105.6 (H) 80.0 - 100.0 fL   MCH 35.2 (H) 26.0 - 34.0 pg   MCHC 33.3 32.0 - 36.0 g/dL   RDW 22.5 (H) 11.5 - 14.5 %   Platelets 139 (L) 150 - 440 K/uL  Basic metabolic panel     Status: Abnormal   Collection Time: 09/07/16  5:26 AM  Result Value Ref Range   Sodium 142 135 - 145 mmol/L   Potassium 4.3 3.5 - 5.1 mmol/L   Chloride 102 101 - 111 mmol/L   CO2 35 (H) 22 - 32 mmol/L   Glucose, Bld 96 65 - 99 mg/dL   BUN 25 (H) 6 - 20 mg/dL   Creatinine, Ser 0.88 0.44 - 1.00 mg/dL   Calcium 8.6 (L) 8.9 - 10.3 mg/dL   GFR calc non Af Amer 57 (L) >60 mL/min   GFR calc Af Amer >60 >60 mL/min    Comment: (NOTE) The eGFR has been calculated using the CKD EPI equation. This calculation has not been validated in all clinical situations. eGFR's persistently <60 mL/min signify possible Chronic Kidney Disease.    Anion gap 5 5 - 15  Magnesium     Status: None   Collection Time: 09/07/16  5:26 AM  Result Value Ref Range   Magnesium 2.0 1.7 - 2.4 mg/dL  Prepare RBC     Status: None   Collection Time: 09/07/16 11:37 AM  Result Value Ref Range   Order Confirmation ORDER PROCESSED BY BLOOD BANK     No results found.  ROS she reports she has had some shortness of breath requiring oxygen supplement. Her breathing is better since the transfusion. She has also had some recent ankle edema which is now improved. She reports she did have some chest pain in August when her hemoglobin was very low. She has had no  subsequent chest pain. She reports no chills or fever. She reports no abdominal pain. She reports she has been eating satisfactorily. Did have diarrhea as noted above which has resolved. She reports she does have some degree of fecal incontinence and also some urinary incontinence. She reports no recent social rules. She reports some significant weakness with respect to ambulation. She does use a walker. Review of systems otherwise negative Blood pressure 128/76, pulse 79, temperature 97.8 F (36.6 C), temperature source Oral, resp. rate 20, height 5' 1"  (1.549 m), weight 59.9 kg (132 lb), SpO2 100 %. Physical Exam GENERAL: She has awake alert and oriented resting in the hospital bed receiving supplemental oxygen.  HEENT: Pupils equal reactive to light extraocular movements are intact sclera clear .   NECK: Supple with no palpable mass.   CARDIAC: Regular rhythm S1-S2 without murmur  LUNGS: Receiving supplemental oxygen. Lung sounds are clear to auscultation. No current respiratory distress  ABDOMEN: Soft flat nontender with no palpable mass.  RECTUM: Was recently examined in the office with digital exam finding no palpable  mass. During office examination did have frank rectal prolapse which could be reduced. Rectal exam today reveals no current prolapse.   EXTREMITIES: With minimal dependent edema  NEUROLOGICAL: Awake alert oriented moving all extremities  Assessment/Plan: Chronic symptomatic rectal prolapse, anemia  I recommended excision of rectal prolapse. I have discussed the operation care risk and benefits with her in detail. Anticipate another blood transfusion today. We'll give a period of observation after a blood transfusion to ensure she does not go into heart failure before surgery. Plan surgery for excision of rectal prolapse on Wednesday, October 4. Will plan preoperative prophylactic antibiotic 1 dose before surgery  Rochel Brome 09/07/2016, 3:12 PM

## 2016-09-07 NOTE — Progress Notes (Signed)
Date: 09/07/2016,   MRN# [id number] Sara Mccoy [facility] Nov 05, 1926 Code Status:     Code Status Orders        Start     Ordered   09/07/16 1649  Do not attempt resuscitation (DNR)  Continuous    Question Answer Comment  In the event of cardiac or respiratory ARREST Do not call a "code blue"   In the event of cardiac or respiratory ARREST Do not perform Intubation, CPR, defibrillation or ACLS   In the event of cardiac or respiratory ARREST Use medication by any route, position, wound care, and other measures to relive pain and suffering. May use oxygen, suction and manual treatment of airway obstruction as needed for comfort.      09/07/16 1648    Code Status History    Date Active Date Inactive Code Status Order ID Comments User Context   09/06/2016  2:05 AM 09/06/2016  1:01 PM DNR [id number]  [name], DO Inpatient   09/06/2016  1:40 AM 09/06/2016  2:05 AM Full Code [id number]  [name], DO Inpatient   09/06/2016  1:40 AM 09/06/2016  1:40 AM DNR [id number]  [name], DO Inpatient   07/25/2016  8:20 AM 07/27/2016  5:29 PM Full Code [id number]  [name], MD Inpatient   07/22/2016 10:49 AM 07/24/2016  7:04 PM DNR [id number]  [name], MD Inpatient   07/21/2016 10:33 PM 07/22/2016 10:48 AM Full Code [id number]  [name], DO ED   07/21/2016 10:33 PM 07/21/2016 10:33 PM DNR [id number]  [name], DO ED   04/25/2016  3:51 AM 04/26/2016  7:38 PM DNR [id number]  [name], MD Inpatient   04/25/2016  3:29 AM 04/25/2016  3:29 AM Full Code [id number]  [name], MD ED    Advance Directive Documentation   Flowsheet Row Most Recent Value  Type of Advance Directive  Healthcare Power of Attorney, Living will  Pre-existing out of facility DNR order (yellow form or pink MOST form)  No data  "MOST" Form in Place?  No data     Hosp day:'@LENGTHOFSTAYDAYS'$ @ Referring MD: '@ATDPROV'$ @       CC: Pre op clearance  HPI: This is a 82 yr old female known  copd patient, on oxygen, here with rectal bleeding and rectal prolapse. Surgery is being planned.  Pulmonary consulted for pulmonary clearance.  Her h/h is being corrected. She is aware of the high risk of prolong vent placement but want to go on.  History of atrial fibrillation. Cardiology is following. She is DNR.   See denies chest pain, pleurisy, hemoptysis,     PMHX:   Past Medical History:  Diagnosis Date  . Anemia   . COPD (chronic obstructive pulmonary disease) ([place])   . Lung cancer, upper lobe ([place]) 1997   right upper lobe  . Prolapse of intestine   . Raynaud's disease    Surgical Hx:  Past Surgical History:  Procedure Laterality Date  . ABDOMINAL HYSTERECTOMY    . CESAREAN SECTION    . CHOLECYSTECTOMY    . FLEXIBLE SIGMOIDOSCOPY N/A 04/25/2016   Procedure: FLEXIBLE SIGMOIDOSCOPY;  Surgeon: Lucilla Lame, MD;  Location: [facility] ENDOSCOPY;  Service: Endoscopy;  Laterality: N/A;  . JOINT REPLACEMENT    . LUNG SURGERY Right 1997   RUL removed   Family Hx:  Family History  Problem Relation Age of Onset  . Heart attack Son   . Diabetes Son   . Diabetes Mother   . Hypertension Daughter  Social Hx:   Social History  Substance Use Topics  . Smoking status: Former Smoker    Packs/day: 2.00    Years: 46.00  . Smokeless tobacco: Never Used  . Alcohol use No   Medication:    Home Medication:    Current Medication: '@CURMEDTAB'$ @   Allergies:  Review of patient's allergies indicates no known allergies.  Review of Systems: Gen:  Denies  fever, sweats, chills HEENT: Denies blurred vision, double vision, ear pain, eye pain, hearing loss, nose bleeds, sore throat Cvc:  No dizziness, chest pain or heaviness Resp:  Dyspnea on exertion  Gi: Denies swallowing difficulty, stomach pain, nausea or vomiting, diarrhea, constipation, bowel incontinence Gu:  Denies bladder incontinence, burning urine Ext:   No Joint pain, stiffness or swelling Skin: No skin rash, easy bruising or  bleeding or hives Endoc:  No polyuria, polydipsia , polyphagia or weight change Psych: No depression, insomnia or hallucinations  Other:  All other systems negative  Physical Examination:   VS: BP 100/76 (BP Location: Right Arm)   Pulse 89   Temp 97.7 F (36.5 C) (Oral)   Resp (!) 32   Ht '5\' 1"'$  (1.549 m)   Wt 132 lb (59.9 kg)   SpO2 100%   BMI 24.94 kg/m   General Appearance: No distress, awake in bed, [name] 02  Neuro: without focal findings, mental status, speech normal, alert and oriented, cranial nerves 2-12 intact, reflexes normal and symmetric, sensation grossly normal fair historian HEENT: PERRLA, EOM intact, no ptosis, no other lesions noticed Pulmonary:.No wheezing, No rales    Cardiovascular:  Normal S1,S2.  No m/r/g.   Abdomen:Benign, Soft, non-tender, No masses, hepatosplenomegaly, No lymphadenopathy Endoc: No evident thyromegaly, no signs of acromegaly or Cushing features Skin:   warm, no rashes, no ecchymosis  Extremities: normal, no cyanosis, clubbing, no edema, warm with normal capillary refill. Other findings:   Labs results:   Recent Labs     09/05/16  2047  09/06/16  1104  09/07/16  0526  HGB  8.2*  8.5*  9.5*  HCT  24.7*  25.7*  28.4*  MCV  109.8*  105.6*  105.6*  WBC  8.5  6.6  8.2  BUN  24*  19  25*  CREATININE  0.99  0.78  0.88  GLUCOSE  95  92  96  CALCIUM  8.8*  7.2*  8.6*  INR   --   1.31   --   ,    Rad results:  EXAM: CHEST 1 VIEW  COMPARISON:  07/22/2016  FINDINGS: Since prior study, there has been significant improvement. The bilateral irregular interstitial and hazy airspace opacities have significantly improved. Mild persistent opacity is noted in the lung bases, left greater than right.  Probable small pleural effusions, left greater than right. No pneumothorax.  Surgical vascular clips are superimposed over the right hilum, stable. Cardiac silhouette is normal in size.  IMPRESSION: 1. Significant improvement in lung  aeration consistent with improved congestive heart failure. No new abnormalities.    Assessment and Plan: Known copd, for rectal prolapse surgery/ sats good, h/h being corrected and monitored. Her respiration should not affected much since this is lower abdominal surgery. Due to her profound relapse, loosing blood we should move on with the surgery. Moderate-high risk for prolong vent placement. Following, continue copd regimen.    I have personally obtained a history, examined the patient, evaluated laboratory and imaging results, formulated the assessment and plan and placed orders.  The  Patient requires high complexity decision making for assessment and support, frequent evaluation and titration of therapies, application of advanced monitoring technologies and extensive interpretation of multiple databases.   Alysse Rathe,M.D. Pulmonary & Critical care Medicine [facility]

## 2016-09-07 NOTE — Progress Notes (Signed)
Patient ID: Sara Mccoy, female   DOB: 1926-09-19, 82 y.o.   MRN: [id number]  [employer] PROGRESS NOTE  Sara Mccoy [facility] VEL:[id number] DOB: 18-Feb-1926 DOA: 09/05/2016 PCP: Sara Harrier, MD  HPI/Subjective: Patient had retal bleeding last night. S/p PRBC transfusion. Objective: Vitals:   09/07/16 1357 09/07/16 1429  BP: (!) 106/51 128/76  Pulse: 75 79  Resp: 20 20  Temp: 97.8 F (36.6 C) 97.8 F (36.6 C)    Filed Weights   09/05/16 2039  Weight: 132 lb (59.9 kg)    ROS: Review of Systems  Constitutional: Negative for chills and fever.  Eyes: Negative for blurred vision.  Respiratory: Negative for cough and shortness of breath.   Cardiovascular: Negative for chest pain.  Gastrointestinal: Positive for blood in stool and diarrhea. Negative for abdominal pain, constipation, nausea and vomiting.  Genitourinary: Negative for dysuria.  Musculoskeletal: Negative for joint pain.  Neurological: Positive for weakness. Negative for dizziness and headaches.   Exam: Physical Exam  Constitutional: She is oriented to person, place, and time.  HENT:  Nose: No mucosal edema.  Mouth/Throat: No oropharyngeal exudate or posterior oropharyngeal edema.  Eyes: Conjunctivae, EOM and lids are normal. Pupils are equal, round, and reactive to light.  Neck: No JVD present. Carotid bruit is not present. No edema present. No thyroid mass and no thyromegaly present.  Cardiovascular: S1 normal and S2 normal.  Exam reveals no gallop.   No murmur heard. Pulses:      Dorsalis pedis pulses are 2+ on the right side, and 2+ on the left side.  Respiratory: No respiratory distress. She has decreased breath sounds in the right lower field and the left lower field. She has no wheezes. She has no rhonchi. She has rales in the right lower field and the left lower field.  GI: Soft. Bowel sounds are normal. There is no tenderness.  Musculoskeletal:       Right ankle: She exhibits no  swelling.       Left ankle: She exhibits no swelling.  Lymphadenopathy:    She has no cervical adenopathy.  Neurological: She is alert and oriented to person, place, and time. No cranial nerve deficit.  Skin: Skin is warm. No rash noted. Nails show no clubbing.  Psychiatric: She has a normal mood and affect.      Data Reviewed: Basic Metabolic Panel:  Recent Labs Lab 09/05/16 2047 09/06/16 1104 09/07/16 0526  NA 140 143 142  K 4.1 3.5 4.3  CL 103 112* 102  CO2 31 29 35*  GLUCOSE 95 92 96  BUN 24* 19 25*  CREATININE 0.99 0.78 0.88  CALCIUM 8.8* 7.2* 8.6*  MG 1.9 1.6* 2.0  PHOS 3.7  --   --    Liver Function Tests:  Recent Labs Lab 09/05/16 2047  AST 21  ALT 16  ALKPHOS 62  BILITOT 0.7  PROT 6.0*  ALBUMIN 3.4*   CBC:  Recent Labs Lab 09/03/16 1608 09/05/16 2047 09/06/16 1104 09/07/16 0526  WBC 8.9 8.5 6.6 8.2  NEUTROABS 6.4  --   --   --   HGB 7.6* 8.2* 8.5* 9.5*  HCT 22.6* 24.7* 25.7* 28.4*  MCV 109.6* 109.8* 105.6* 105.6*  PLT 194 177 131* 139*   BNP (last 3 results)  Recent Labs  04/25/16 0106 05/15/16 1830  BNP 318.0* 363.0*     Scheduled Meds: . calcium-vitamin D  1 tablet Oral BID  . cholecalciferol  1,000 Units Oral Daily  . feeding supplement (ENSURE  ENLIVE)  237 mL Oral BID BM  . ferrous sulfate  325 mg Oral Q breakfast  . metoprolol  50 mg Oral BID  . mometasone-formoterol  2 puff Inhalation BID  . multivitamin with minerals  1 tablet Oral Daily  . pantoprazole  40 mg Oral QAC breakfast  . potassium chloride  20 mEq Oral Daily  . pramipexole  0.5 mg Oral QHS  . sodium chloride flush  3 mL Intravenous Q12H  . venlafaxine XR  37.5 mg Oral Q breakfast  . vitamin B-12  5,000 mcg Oral Daily  . vitamin C  1,000 mg Oral Daily  . vitamin E  400 Units Oral Daily    Assessment/Plan:  1. Symptomatic anemia due to lower GI bleed from rectal prolapse irritation.  S/p 2 units of packed red blood cells and her hemoglobin is up to 9.5,  will give third unit of blood today. F/u Hb in am. 2. Rectal prolapse. Surgical consultation for surgery. 3. Paroxysmal atrial fibrillation. Changed Norvasc to Cardizem. On Lopressor already. 4. Hypertension. Switched Norvasc to Cardizem and continue Lopressor 5. Anxiety on Xanax and Effexor 6. Chronic respiratory failure and COPD. Stable. Continue Advair and DuoNeb and oxygen supplementation. 7. Hypomagnesemia, improved with magnesium IV 8. Hypokalemia, improved with potassium orally 9. GERD on Prilosec  Code Status:     Code Status Orders        Start     Ordered   09/06/16 0206  Do not attempt resuscitation (DNR)  Continuous    Question Answer Comment  In the event of cardiac or respiratory ARREST Do not call a "code blue"   In the event of cardiac or respiratory ARREST Do not perform Intubation, CPR, defibrillation or ACLS   In the event of cardiac or respiratory ARREST Use medication by any route, position, wound care, and other measures to relive pain and suffering. May use oxygen, suction and manual treatment of airway obstruction as needed for comfort.      09/06/16 0205    Code Status History    Date Active Date Inactive Code Status Order ID Comments User Context   09/06/2016  2:05 AM 09/06/2016  1:01 PM DNR [id number]  [name], DO Inpatient   09/06/2016  1:40 AM 09/06/2016  2:05 AM Full Code [id number]  [name], DO Inpatient   09/06/2016  1:40 AM 09/06/2016  1:40 AM DNR [id number]  [name], DO Inpatient   07/25/2016  8:20 AM 07/27/2016  5:29 PM Full Code [id number]  [name], MD Inpatient   07/22/2016 10:49 AM 07/24/2016  7:04 PM DNR [id number]  [name], MD Inpatient   07/21/2016 10:33 PM 07/22/2016 10:48 AM Full Code [id number]  [name], DO ED   07/21/2016 10:33 PM 07/21/2016 10:33 PM DNR [id number]  [name], DO ED   04/25/2016  3:51 AM 04/26/2016  7:38 PM DNR [id number]  [name], MD Inpatient   04/25/2016  3:29 AM  04/25/2016  3:29 AM Full Code [id number]  [name], MD ED    Questions for Most Recent Historical Code Status (Order [id number])    Question Answer Comment   In the event of cardiac or respiratory ARREST Do not call a "code blue"    In the event of cardiac or respiratory ARREST Do not perform Intubation, CPR, defibrillation or ACLS    In the event of cardiac or respiratory ARREST Use medication by any route, position, wound care, and other measures to relive pain and suffering.  May use oxygen, suction and manual treatment of airway obstruction as needed for comfort.         Advance Directive Documentation   Flowsheet Row Most Recent Value  Type of Advance Directive  Healthcare Power of Attorney, Living will  Pre-existing out of facility DNR order (yellow form or pink MOST form)  No data  "MOST" Form in Place?  No data     Family Communication: Family at the bedside  Disposition Plan: To be determined  Consultants:  Gen. Surgery  Cardiology consultation put in by admitting physician  Pulmonary put in by admitting physician  Time spent: 35 minutes  [place], [place]

## 2016-09-07 NOTE — Progress Notes (Signed)
SUBJECTIVE: Sara Mccoy is awake and alert and talkative this morning. Is feeling better with improvement in her weakness and dizziness. She has not had any chest pain. She is hoping to have surgery to correct her rectal prolapse.   Vitals:   09/06/16 1225 09/06/16 1307 09/06/16 2048 09/07/16 0532  BP: 131/61 132/62 119/73 (!) 114/57  Pulse:  80 70 81  Resp:  18 18 (!) 22  Temp:  98.2 F (36.8 C) 98.1 F (36.7 C) 98.4 F (36.9 C)  TempSrc:  Oral Oral Oral  SpO2:  100% 100% 100%  Weight:      Height:        Intake/Output Summary (Last 24 hours) at 09/07/16 0837 Last data filed at 09/07/16 0023  Gross per 24 hour  Intake              672 ml  Output              350 ml  Net              322 ml    LABS: Basic Metabolic Panel:  Recent Labs  09/05/16 2047 09/06/16 1104 09/07/16 0526  NA 140 143 142  K 4.1 3.5 4.3  CL 103 112* 102  CO2 31 29 35*  GLUCOSE 95 92 96  BUN 24* 19 25*  CREATININE 0.99 0.78 0.88  CALCIUM 8.8* 7.2* 8.6*  MG 1.9 1.6* 2.0  PHOS 3.7  --   --    Liver Function Tests:  Recent Labs  09/05/16 2047  AST 21  ALT 16  ALKPHOS 62  BILITOT 0.7  PROT 6.0*  ALBUMIN 3.4*   No results for input(s): LIPASE, AMYLASE in the last 72 hours. CBC:  Recent Labs  09/06/16 1104 09/07/16 0526  WBC 6.6 8.2  HGB 8.5* 9.5*  HCT 25.7* 28.4*  MCV 105.6* 105.6*  PLT 131* 139*   Cardiac Enzymes: No results for input(s): CKTOTAL, CKMB, CKMBINDEX, TROPONINI in the last 72 hours. BNP: Invalid input(s): POCBNP D-Dimer: No results for input(s): DDIMER in the last 72 hours. Hemoglobin A1C: No results for input(s): HGBA1C in the last 72 hours. Fasting Lipid Panel: No results for input(s): CHOL, HDL, LDLCALC, TRIG, CHOLHDL, LDLDIRECT in the last 72 hours. Thyroid Function Tests: No results for input(s): TSH, T4TOTAL, T3FREE, THYROIDAB in the last 72 hours.  Invalid input(s): FREET3 Anemia Panel: No results for input(s): VITAMINB12, FOLATE, FERRITIN,  TIBC, IRON, RETICCTPCT in the last 72 hours.   PHYSICAL EXAM General: Well developed, well nourished, in no acute distress HEENT:  Normocephalic and atramatic Neck:  No JVD.  Lungs: Clear bilaterally to auscultation and percussion. Heart: Heart rate irregular Abdomen: Bowel sounds are positive, abdomen soft and non-tender  Msk:  Back normal, normal gait. Normal strength and tone for age. Extremities: No clubbing, cyanosis or edema.   Neuro: Alert and oriented X 3. Psych:  Good affect, responds appropriately  TELEMETRY: Atrial fibrillation with rates in the 70s and 80s  ASSESSMENT AND PLAN: Patient was admitted with anemia and lower GI bleed secondary to rectal prolapse. She has received 2 units of blood since admission. Patient did have a run of nonsustained V. tach and today she is maintaining atrial fibrillation with controlled rate. Symptoms of weakness and lightheadedness and shortness of breath have improved with the first set of transfusions. Patient likely needs surgery for her rectal prolapse and has been deemed low to moderate risk by Dr. Neoma Laming. He advises getting her hemoglobin  over 10 prior to surgery.   Active Problems:   Symptomatic anemia    Daune Perch, NP 09/07/2016 8:37 AM

## 2016-09-07 NOTE — Progress Notes (Signed)
Patient ID: Sara Mccoy, female   DOB: 1926-09-03, 82 y.o.   MRN: [id number]   She has had her third blood transfusion. She is not eating supper.  Plan is to do excision of prolapsed rectum on Wednesday, October 4 at 12:30 PM  This is part of a plan to delay surgery after the last blood transfusion to hopefully avoid postoperative pulmonary edema.  Gilford Rile has been obtained and encourage walking in the hallway.  See surgery consult note.

## 2016-09-08 ENCOUNTER — Inpatient Hospital Stay: Payer: PPO

## 2016-09-08 DIAGNOSIS — E43 Unspecified severe protein-calorie malnutrition: Secondary | ICD-10-CM | POA: Insufficient documentation

## 2016-09-08 LAB — BASIC METABOLIC PANEL
Anion gap: 3 — ABNORMAL LOW (ref 5–15)
BUN: 31 mg/dL — AB (ref 6–20)
CHLORIDE: 99 mmol/L — AB (ref 101–111)
CO2: 37 mmol/L — AB (ref 22–32)
Calcium: 8.7 mg/dL — ABNORMAL LOW (ref 8.9–10.3)
Creatinine, Ser: 0.94 mg/dL (ref 0.44–1.00)
GFR calc Af Amer: >60 mL/min (ref >60)
GFR calc non Af Amer: 53 mL/min — ABNORMAL LOW (ref >60)
Glucose, Bld: 106 mg/dL — ABNORMAL HIGH (ref 65–99)
POTASSIUM: 4.1 mmol/L (ref 3.5–5.1)
SODIUM: 139 mmol/L (ref 135–145)

## 2016-09-08 LAB — CBC
HEMATOCRIT: 31.6 % — AB (ref 35.0–47.0)
HEMOGLOBIN: 10.7 g/dL — AB (ref 12.0–16.0)
MCH: 34.7 pg — AB (ref 26.0–34.0)
MCHC: 33.9 g/dL (ref 32.0–36.0)
MCV: 102.5 fL — AB (ref 80.0–100.0)
Platelets: 115 10*3/uL — ABNORMAL LOW (ref 150–440)
RBC: 3.08 MIL/uL — AB (ref 3.80–5.20)
RDW: 21 % — ABNORMAL HIGH (ref 11.5–14.5)
WBC: 12.7 10*3/uL — ABNORMAL HIGH (ref 3.6–11.0)

## 2016-09-08 LAB — TYPE AND SCREEN
ABO/RH(D): O POS
Antibody Screen: NEGATIVE
Unit division: 0

## 2016-09-08 MED ORDER — METOPROLOL TARTRATE 25 MG PO TABS
25.0000 mg | ORAL_TABLET | Freq: Two times a day (BID) | ORAL | Status: DC
  Administered 2016-09-08 – 2016-09-10 (×4): 25 mg via ORAL
  Filled 2016-09-08 (×5): qty 1

## 2016-09-08 MED ORDER — CEFOTETAN DISODIUM 2 G IJ SOLR
2.0000 g | Freq: Once | INTRAMUSCULAR | Status: AC
  Administered 2016-09-09: 2 g via INTRAVENOUS
  Filled 2016-09-08: qty 2

## 2016-09-08 MED ORDER — SODIUM CHLORIDE 0.9 % IV SOLN
INTRAVENOUS | Status: DC
  Administered 2016-09-08: 19:00:00 via INTRAVENOUS

## 2016-09-08 NOTE — Progress Notes (Signed)
Patient ID: Sara Mccoy, female   DOB: June 03, 1926, 82 y.o.   MRN: [id number] She reports no anorectal pain today. She received her third blood transfusion yesterday. Her hemoglobin today is 10.5. She reports she does. Stronger. She feels she is breathing easily.  On examination she was asleep but easily arousable  and oriented. Lung sounds are clear.  Diagnosis rectal prolapse, anemia  Plan is to do her surgery tomorrow to resect rectal prolapse. I discussed the operation and plan risk and benefits. Nothing by mouth past midnight except for sips of water with the medicine

## 2016-09-08 NOTE — Progress Notes (Addendum)
Patient ID: Sara Mccoy, female   DOB: 1926-01-14, 82 y.o.   MRN: [id number]  [facility] PROGRESS NOTE  Sara Mccoy GYB:[id number] DOB: May 10, 1926 DOA: 09/05/2016 PCP: Tracie Harrier, MD  HPI/Subjective: No retal bleeding, S/p 3 units PRBC transfusion. Objective: Vitals:   09/08/16 1041 09/08/16 1313  BP: (!) 103/50 105/60  Pulse: 79 94  Resp:  (!) 24  Temp:  97.9 F (36.6 C)    Filed Weights   09/05/16 2039  Weight: 132 lb (59.9 kg)    ROS: Review of Systems  Constitutional: Negative for chills and fever.  Eyes: Negative for blurred vision.  Respiratory: Negative for cough and shortness of breath.   Cardiovascular: Negative for chest pain.  Gastrointestinal: Negative for abdominal pain, blood in stool, constipation, diarrhea, nausea and vomiting.  Genitourinary: Negative for dysuria.  Musculoskeletal: Negative for joint pain.  Neurological: Positive for weakness. Negative for dizziness and headaches.   Exam: Physical Exam  Constitutional: She is oriented to person, place, and time.  HENT:  Nose: No mucosal edema.  Mouth/Throat: No oropharyngeal exudate or posterior oropharyngeal edema.  Eyes: Conjunctivae, EOM and lids are normal. Pupils are equal, round, and reactive to light.  Neck: No JVD present. Carotid bruit is not present. No edema present. No thyroid mass and no thyromegaly present.  Cardiovascular: S1 normal and S2 normal.  Exam reveals no gallop.   No murmur heard. Pulses:      Dorsalis pedis pulses are 2+ on the right side, and 2+ on the left side.  Respiratory: No respiratory distress. She has decreased breath sounds in the right lower field and the left lower field. She has no wheezes. She has no rhonchi. She has no rales.  GI: Soft. Bowel sounds are normal. There is no tenderness.  Musculoskeletal:       Right ankle: She exhibits no swelling.       Left ankle: She exhibits no swelling.  Lymphadenopathy:    She has no  cervical adenopathy.  Neurological: She is alert and oriented to person, place, and time. No cranial nerve deficit.  Skin: Skin is warm. No rash noted. Nails show no clubbing.  Psychiatric: She has a normal mood and affect.      Data Reviewed: Basic Metabolic Panel:  Recent Labs Lab 09/05/16 2047 09/06/16 1104 09/07/16 0526 09/08/16 0340  NA 140 143 142 139  K 4.1 3.5 4.3 4.1  CL 103 112* 102 99*  CO2 31 29 35* 37*  GLUCOSE 95 92 96 106*  BUN 24* 19 25* 31*  CREATININE 0.99 0.78 0.88 0.94  CALCIUM 8.8* 7.2* 8.6* 8.7*  MG 1.9 1.6* 2.0  --   PHOS 3.7  --   --   --    Liver Function Tests:  Recent Labs Lab 09/05/16 2047  AST 21  ALT 16  ALKPHOS 62  BILITOT 0.7  PROT 6.0*  ALBUMIN 3.4*   CBC:  Recent Labs Lab 09/03/16 1608 09/05/16 2047 09/06/16 1104 09/07/16 0526 09/08/16 0340  WBC 8.9 8.5 6.6 8.2 12.7*  NEUTROABS 6.4  --   --   --   --   HGB 7.6* 8.2* 8.5* 9.5* 10.7*  HCT 22.6* 24.7* 25.7* 28.4* 31.6*  MCV 109.6* 109.8* 105.6* 105.6* 102.5*  PLT 194 177 131* 139* 115*   BNP (last 3 results)  Recent Labs  04/25/16 0106 05/15/16 1830  BNP 318.0* 363.0*     Scheduled Meds: . calcium-vitamin D  1 tablet Oral BID  . [  START ON 09/09/2016] cefoTEtan (CEFOTAN) IV  2 g Intravenous Once  . cholecalciferol  1,000 Units Oral Daily  . feeding supplement (ENSURE ENLIVE)  237 mL Oral BID BM  . ferrous sulfate  325 mg Oral Q breakfast  . metoprolol  50 mg Oral BID  . mometasone-formoterol  2 puff Inhalation BID  . multivitamin with minerals  1 tablet Oral Daily  . pantoprazole  40 mg Oral QAC breakfast  . potassium chloride  20 mEq Oral Daily  . pramipexole  0.5 mg Oral QHS  . sodium chloride flush  3 mL Intravenous Q12H  . venlafaxine XR  37.5 mg Oral Q breakfast  . vitamin B-12  5,000 mcg Oral Daily  . vitamin C  1,000 mg Oral Daily  . vitamin E  400 Units Oral Daily    Assessment/Plan:  1. Symptomatic anemia due to lower GI bleed from rectal  prolapse irritation.  S/p 3 units of packed red blood cells and her hemoglobin is up to 10.7. 2. Rectal prolapse. Surgery tomorrow per Surgical consultation. 3. Paroxysmal atrial fibrillation. Changed Norvasc to Cardizem. On Lopressor already. 4. Hypertension. Hold Cardizem and decrease Lopressor to 25 mg bid due to Low side BP. 5. Anxiety on Xanax and Effexor 6. Chronic respiratory failure and COPD. Stable. Continue Advair and DuoNeb and oxygen supplementation. 7. Hypomagnesemia, improved with magnesium IV 8. Hypokalemia, improved with potassium orally 9. GERD on Prilosec  Dehydration.  Start NS iv, f/u BMP.  Code Status:     Code Status Orders        Start     Ordered   09/06/16 0206  Do not attempt resuscitation (DNR)  Continuous    Question Answer Comment  In the event of cardiac or respiratory ARREST Do not call a "code blue"   In the event of cardiac or respiratory ARREST Do not perform Intubation, CPR, defibrillation or ACLS   In the event of cardiac or respiratory ARREST Use medication by any route, position, wound care, and other measures to relive pain and suffering. May use oxygen, suction and manual treatment of airway obstruction as needed for comfort.      09/06/16 0205    Code Status History    Date Active Date Inactive Code Status Order ID Comments User Context   09/06/2016  2:05 AM 09/06/2016  1:01 PM DNR [id number]  [name], DO Inpatient   09/06/2016  1:40 AM 09/06/2016  2:05 AM Full Code [id number]  [name], DO Inpatient   09/06/2016  1:40 AM 09/06/2016  1:40 AM DNR [id number]  [name], DO Inpatient   07/25/2016  8:20 AM 07/27/2016  5:29 PM Full Code [id number]  [name], MD Inpatient   07/22/2016 10:49 AM 07/24/2016  7:04 PM DNR [id number]  [name], MD Inpatient   07/21/2016 10:33 PM 07/22/2016 10:48 AM Full Code [id number]  [name], DO ED   07/21/2016 10:33 PM 07/21/2016 10:33 PM DNR [id number]  [name], DO ED    04/25/2016  3:51 AM 04/26/2016  7:38 PM DNR [id number]  [name], MD Inpatient   04/25/2016  3:29 AM 04/25/2016  3:29 AM Full Code [id number]  [name], MD ED    Questions for Most Recent Historical Code Status (Order [id number])    Question Answer Comment   In the event of cardiac or respiratory ARREST Do not call a "code blue"    In the event of cardiac or respiratory ARREST Do not perform Intubation, CPR, defibrillation  or ACLS    In the event of cardiac or respiratory ARREST Use medication by any route, position, wound care, and other measures to relive pain and suffering. May use oxygen, suction and manual treatment of airway obstruction as needed for comfort.         Advance Directive Documentation   Flowsheet Row Most Recent Value  Type of Advance Directive  Healthcare Power of Attorney, Living will  Pre-existing out of facility DNR order (yellow form or pink MOST form)  No data  "MOST" Form in Place?  No data     Family Communication: Family at the bedside  Disposition Plan: To be determined  Consultants:  Gen. Surgery  Cardiology consultation put in by admitting physician  Pulmonary put in by admitting physician  Time spent: 27 minutes  Sara Mccoy, Sara Mccoy

## 2016-09-08 NOTE — Progress Notes (Signed)
Per Dr. Darvin Neighbours prime doc, okay to place orer for continuous Normal saliene at 81m/hr as patinet has not drank much today and will be NPO after midnight. Pt has also not had a lot of output

## 2016-09-08 NOTE — Progress Notes (Signed)
  SUBJECTIVE: Sara Mccoy is alert and pleasant this morning. She denies any chest pain, shortness of breath or dizziness.   Vitals:   09/07/16 1650 09/07/16 1654 09/07/16 2030 09/08/16 0514  BP: 100/76  133/73 120/76  Pulse: (!) 30 89 87 89  Resp: (!) 32  (!) 24 (!) 22  Temp: 97.7 F (36.5 C)  98.1 F (36.7 C) 98.1 F (36.7 C)  TempSrc: Oral  Oral Oral  SpO2: 95% 100% 100% 99%  Weight:      Height:        Intake/Output Summary (Last 24 hours) at 09/08/16 0850 Last data filed at 09/08/16 0801  Gross per 24 hour  Intake              877 ml  Output              300 ml  Net              577 ml    LABS: Basic Metabolic Panel:  Recent Labs  09/05/16 2047 09/06/16 1104 09/07/16 0526 09/08/16 0340  NA 140 143 142 139  K 4.1 3.5 4.3 4.1  CL 103 112* 102 99*  CO2 31 29 35* 37*  GLUCOSE 95 92 96 106*  BUN 24* 19 25* 31*  CREATININE 0.99 0.78 0.88 0.94  CALCIUM 8.8* 7.2* 8.6* 8.7*  MG 1.9 1.6* 2.0  --   PHOS 3.7  --   --   --    Liver Function Tests:  Recent Labs  09/05/16 2047  AST 21  ALT 16  ALKPHOS 62  BILITOT 0.7  PROT 6.0*  ALBUMIN 3.4*   No results for input(s): LIPASE, AMYLASE in the last 72 hours. CBC:  Recent Labs  09/07/16 0526 09/08/16 0340  WBC 8.2 12.7*  HGB 9.5* 10.7*  HCT 28.4* 31.6*  MCV 105.6* 102.5*  PLT 139* 115*   Cardiac Enzymes: No results for input(s): CKTOTAL, CKMB, CKMBINDEX, TROPONINI in the last 72 hours. BNP: Invalid input(s): POCBNP D-Dimer: No results for input(s): DDIMER in the last 72 hours. Hemoglobin A1C: No results for input(s): HGBA1C in the last 72 hours. Fasting Lipid Panel: No results for input(s): CHOL, HDL, LDLCALC, TRIG, CHOLHDL, LDLDIRECT in the last 72 hours. Thyroid Function Tests: No results for input(s): TSH, T4TOTAL, T3FREE, THYROIDAB in the last 72 hours.  Invalid input(s): FREET3 Anemia Panel: No results for input(s): VITAMINB12, FOLATE, FERRITIN, TIBC, IRON, RETICCTPCT in the last 72  hours.   PHYSICAL EXAM General: Well developed, well nourished, in no acute distress HEENT:  Normocephalic and atramatic Neck:  No JVD.  Lungs: Clear bilaterally to auscultation and percussion. Heart: HRRR . Normal S1 and S2 without gallops or murmurs.  Abdomen: Bowel sounds are positive, abdomen soft and non-tender  Msk:  Back normal, normal gait. Normal strength and tone for age. Extremities: No clubbing, cyanosis or edema.   Neuro: Alert and oriented X 3. Psych:  Good affect, responds appropriately  TELEMETRY: Atrial fibrillation  ASSESSMENT AND PLAN: Anemia due to lower GI bleed secondary to rectal prolapse. She has received now 3 units of blood since admission and hemoglobin is over 10. She is scheduled for repair of her rectal prolapse tomorrow. Atrial fibrillation is rate controlled. I will signs are stable. Patient is low to moderate risk for surgery and advise proceeding.  Active Problems:   Symptomatic anemia    Sara Perch, NP 09/08/2016 8:50 AM

## 2016-09-08 NOTE — Plan of Care (Signed)
Problem: Physical Regulation: Goal: Ability to maintain clinical measurements within normal limits will improve Outcome: Progressing Hgb increasing to 10.7; no acute bleeding noted overnight; VSS.

## 2016-09-09 ENCOUNTER — Inpatient Hospital Stay: Payer: PPO | Admitting: Anesthesiology

## 2016-09-09 ENCOUNTER — Encounter
Admission: EM | Disposition: A | Discharge status: Skilled Nursing Facility | Payer: Self-pay | Source: Home / Self Care | Attending: Internal Medicine

## 2016-09-09 ENCOUNTER — Encounter: Payer: Self-pay | Admitting: Anesthesiology

## 2016-09-09 HISTORY — PX: REPAIR OF RECTAL PROLAPSE: SHX6420

## 2016-09-09 LAB — MRSA PCR SCREENING: MRSA BY PCR: NEGATIVE

## 2016-09-09 SURGERY — REPAIR, PROLAPSE, RECTUM
Anesthesia: General

## 2016-09-09 MED ORDER — IPRATROPIUM-ALBUTEROL 0.5-2.5 (3) MG/3ML IN SOLN: 3.0000 mL | Freq: Once | RESPIRATORY_TRACT | Status: DC

## 2016-09-09 MED ORDER — ONDANSETRON HCL 4 MG/2ML IJ SOLN
INTRAMUSCULAR | Status: DC | PRN
  Administered 2016-09-09: 4 mg via INTRAVENOUS

## 2016-09-09 MED ORDER — FENTANYL CITRATE (PF) 100 MCG/2ML IJ SOLN
INTRAMUSCULAR | Status: AC
  Filled 2016-09-09: qty 2

## 2016-09-09 MED ORDER — SUGAMMADEX SODIUM 200 MG/2ML IV SOLN
INTRAVENOUS | Status: DC | PRN
  Administered 2016-09-09: 120 mg via INTRAVENOUS

## 2016-09-09 MED ORDER — PROPOFOL 10 MG/ML IV BOLUS
INTRAVENOUS | Status: DC | PRN
  Administered 2016-09-09: 50 mg via INTRAVENOUS

## 2016-09-09 MED ORDER — DILTIAZEM HCL 25 MG/5ML IV SOLN
15.0000 mg | Freq: Once | INTRAVENOUS | Status: AC
  Administered 2016-09-09: 15 mg via INTRAVENOUS

## 2016-09-09 MED ORDER — KCL IN DEXTROSE-NACL 20-5-0.2 MEQ/L-%-% IV SOLN
INTRAVENOUS | Status: DC
  Administered 2016-09-09: 17:00:00 via INTRAVENOUS
  Filled 2016-09-09 (×2): qty 1000

## 2016-09-09 MED ORDER — FENTANYL CITRATE (PF) 100 MCG/2ML IJ SOLN
25.0000 ug | INTRAMUSCULAR | Status: DC | PRN
  Administered 2016-09-09: 50 ug via INTRAVENOUS

## 2016-09-09 MED ORDER — FENTANYL CITRATE (PF) 100 MCG/2ML IJ SOLN
INTRAMUSCULAR | Status: DC | PRN
  Administered 2016-09-09 (×2): 50 ug via INTRAVENOUS

## 2016-09-09 MED ORDER — OXYCODONE HCL 5 MG/5ML PO SOLN: 5.0000 mg | Freq: Once | ORAL | Status: DC | PRN

## 2016-09-09 MED ORDER — OXYCODONE HCL 5 MG PO TABS: 5.0000 mg | ORAL_TABLET | Freq: Once | ORAL | Status: DC | PRN

## 2016-09-09 MED ORDER — SUCCINYLCHOLINE CHLORIDE 20 MG/ML IJ SOLN
INTRAMUSCULAR | Status: DC | PRN
  Administered 2016-09-09: 100 mg via INTRAVENOUS

## 2016-09-09 MED ORDER — DEXAMETHASONE SODIUM PHOSPHATE 10 MG/ML IJ SOLN
INTRAMUSCULAR | Status: DC | PRN
  Administered 2016-09-09: 5 mg via INTRAVENOUS

## 2016-09-09 MED ORDER — LIDOCAINE HCL (CARDIAC) 20 MG/ML IV SOLN
INTRAVENOUS | Status: DC | PRN
  Administered 2016-09-09: 20 mg via INTRAVENOUS

## 2016-09-09 MED ORDER — METOPROLOL TARTRATE 5 MG/5ML IV SOLN
INTRAVENOUS | Status: DC | PRN
  Administered 2016-09-09: 3 mg via INTRAVENOUS
  Administered 2016-09-09: 2 mg via INTRAVENOUS

## 2016-09-09 MED ORDER — ROCURONIUM BROMIDE 100 MG/10ML IV SOLN
INTRAVENOUS | Status: DC | PRN
  Administered 2016-09-09: 20 mg via INTRAVENOUS
  Administered 2016-09-09: 5 mg via INTRAVENOUS

## 2016-09-09 SURGICAL SUPPLY — 24 items
BLADE SURG 15 STRL LF DISP TIS (BLADE) IMPLANT
BLADE SURG 15 STRL SS (BLADE)
CANISTER SUCT 1200ML W/VALVE (MISCELLANEOUS) ×3 IMPLANT
DRAPE LAPAROTOMY 100X77 ABD (DRAPES) ×3 IMPLANT
DRAPE LEGGINS SURG 28X43 STRL (DRAPES) ×3 IMPLANT
DRAPE UNDER BUTTOCK W/FLU (DRAPES) ×3 IMPLANT
ELECT REM PT RETURN 9FT ADLT (ELECTROSURGICAL) ×3
ELECTRODE REM PT RTRN 9FT ADLT (ELECTROSURGICAL) ×1 IMPLANT
GAUZE SPONGE 4X4 12PLY STRL (GAUZE/BANDAGES/DRESSINGS) ×3 IMPLANT
GLOVE BIO SURGEON STRL SZ7.5 (GLOVE) ×6 IMPLANT
LABEL OR SOLS (LABEL) ×3 IMPLANT
NEEDLE HYPO 25X1 1.5 SAFETY (NEEDLE) ×3 IMPLANT
NS IRRIG 500ML POUR BTL (IV SOLUTION) ×3 IMPLANT
PACK BASIN MINOR ARMC (MISCELLANEOUS) ×3 IMPLANT
PAD PREP 24X41 OB/GYN DISP (PERSONAL CARE ITEMS) ×3 IMPLANT
RELOAD PROXIMATE 75MM BLUE (ENDOMECHANICALS) ×18 IMPLANT
SOL PREP PVP 2OZ (MISCELLANEOUS) ×3
SOLUTION PREP PVP 2OZ (MISCELLANEOUS) ×1 IMPLANT
SPONGE XRAY 4X4 16PLY STRL (MISCELLANEOUS) ×3 IMPLANT
STAPLER PROXIMATE 75MM BLUE (STAPLE) ×3 IMPLANT
SURGILUBE 2OZ TUBE FLIPTOP (MISCELLANEOUS) ×3 IMPLANT
SUT CHROMIC 2 0 SH (SUTURE) ×3 IMPLANT
SUT CHROMIC 3 0 SH 27 (SUTURE) ×3 IMPLANT
SYRINGE 10CC LL (SYRINGE) ×3 IMPLANT

## 2016-09-09 NOTE — Transfer of Care (Signed)
Immediate Anesthesia Transfer of Care Note  Patient: Sara Mccoy  Procedure(s) Performed: Procedure(s): REPAIR OF RECTAL PROLAPSE (N/A)  Patient Location: PACU  Anesthesia Type:General  Level of Consciousness: sedated  Airway & Oxygen Therapy: Patient Spontanous Breathing and Patient connected to face mask oxygen  Post-op Assessment: Report given to RN and Post -op Vital signs reviewed and stable  Post vital signs: Reviewed and stable  Last Vitals:  Vitals:   09/09/16 1247 09/09/16 1423  BP:  138/68  Pulse: 89 60  Resp: (!) 25 12  Temp:  37.1 C    Last Pain:  Vitals:   09/09/16 0500  TempSrc: Oral  PainSc:          Complications: No apparent anesthesia complications

## 2016-09-09 NOTE — Anesthesia Postprocedure Evaluation (Signed)
Anesthesia Post Note  Patient: Sara Mccoy  Procedure(s) Performed: Procedure(s) (LRB): REPAIR OF RECTAL PROLAPSE (N/A)  Patient location during evaluation: PACU Anesthesia Type: General Level of consciousness: awake and alert Pain management: pain level controlled Vital Signs Assessment: post-procedure vital signs reviewed and stable Respiratory status: spontaneous breathing, nonlabored ventilation, respiratory function stable and patient connected to nasal cannula oxygen Cardiovascular status: blood pressure returned to baseline and stable Postop Assessment: no signs of nausea or vomiting Anesthetic complications: no    Last Vitals:  Vitals:   09/09/16 1508 09/09/16 1523  BP: 118/66 130/66  Pulse: (!) 55 92  Resp: 18 (!) 23  Temp:  36.7 C    Last Pain:  Vitals:   09/09/16 1523  TempSrc:   PainSc: Asleep                 Precious Haws Humberto Addo

## 2016-09-09 NOTE — Progress Notes (Signed)
She has been drowsy since surgery.  She has been arousing at times and drinking small amount of clear liquids.  She reports no pain at the operative site.  She reports breathing satisfactorily.  On examination vital signs are stable.  She is drowsy but can be aroused and is aware of circumstance.  Oxygen saturation is 90%.  Lung sounds are shallow.  Continue sipping clear liquids and advance diet as tolerated to regular.  Resume walking with a walker once her drowsiness resolves

## 2016-09-09 NOTE — Progress Notes (Signed)
Patient ID: [name], female   DOB: January 18, 1926, 82 y.o.   MRN: [id number]   [facility] PROGRESS NOTE  [facility] ACZ:[id number] DOB: 1926/06/24 DOA: 09/05/2016 PCP: Tracie Harrier, MD  HPI/Subjective: Patient seen this am prior to surgery. Patient looking forward to getting things taken care of  Objective: Vitals:   09/09/16 0855 09/09/16 1237  BP: 108/60   Pulse: 84 (!) 105  Resp:  20  Temp:      Filed Weights   09/05/16 2039  Weight: 59.9 kg (132 lb)    ROS: Review of Systems  Constitutional: Negative for chills and fever.  Eyes: Negative for blurred vision.  Respiratory: Positive for shortness of breath. Negative for cough.   Cardiovascular: Negative for chest pain.  Gastrointestinal: Negative for abdominal pain, constipation, diarrhea, nausea and vomiting.  Genitourinary: Negative for dysuria.  Musculoskeletal: Negative for joint pain.  Neurological: Positive for weakness. Negative for dizziness and headaches.   Exam: Physical Exam  Constitutional: She is oriented to person, place, and time.  HENT:  Nose: No mucosal edema.  Mouth/Throat: No oropharyngeal exudate or posterior oropharyngeal edema.  Eyes: Conjunctivae, EOM and lids are normal. Pupils are equal, round, and reactive to light.  Neck: No JVD present. Carotid bruit is not present. No edema present. No thyroid mass and no thyromegaly present.  Cardiovascular: S1 normal and S2 normal.  Exam reveals no gallop.   No murmur heard. Pulses:      Dorsalis pedis pulses are 2+ on the right side, and 2+ on the left side.  Respiratory: No respiratory distress. She has decreased breath sounds in the right lower field and the left lower field. She has no wheezes. She has no rhonchi. She has no rales.  GI: Soft. Bowel sounds are normal. There is no tenderness.  Musculoskeletal:       Right ankle: She exhibits no swelling.       Left ankle: She exhibits no swelling.   Lymphadenopathy:    She has no cervical adenopathy.  Neurological: She is alert and oriented to person, place, and time. No cranial nerve deficit.  Skin: Skin is warm. No rash noted. Nails show no clubbing.  Psychiatric: She has a normal mood and affect.      Data Reviewed: Basic Metabolic Panel:  Recent Labs Lab 09/05/16 2047 09/06/16 1104 09/07/16 0526 09/08/16 0340  NA 140 143 142 139  K 4.1 3.5 4.3 4.1  CL 103 112* 102 99*  CO2 31 29 35* 37*  GLUCOSE 95 92 96 106*  BUN 24* 19 25* 31*  CREATININE 0.99 0.78 0.88 0.94  CALCIUM 8.8* 7.2* 8.6* 8.7*  MG 1.9 1.6* 2.0  --   PHOS 3.7  --   --   --    Liver Function Tests:  Recent Labs Lab 09/05/16 2047  AST 21  ALT 16  ALKPHOS 62  BILITOT 0.7  PROT 6.0*  ALBUMIN 3.4*   CBC:  Recent Labs Lab 09/03/16 1608 09/05/16 2047 09/06/16 1104 09/07/16 0526 09/08/16 0340  WBC 8.9 8.5 6.6 8.2 12.7*  NEUTROABS 6.4  --   --   --   --   HGB 7.6* 8.2* 8.5* 9.5* 10.7*  HCT 22.6* 24.7* 25.7* 28.4* 31.6*  MCV 109.6* 109.8* 105.6* 105.6* 102.5*  PLT 194 177 131* 139* 115*   BNP (last 3 results)  Recent Labs  04/25/16 0106 05/15/16 1830  BNP 318.0* 363.0*     Scheduled Meds: . calcium-vitamin D  1 tablet  Oral BID  . cefoTEtan (CEFOTAN) IV  2 g Intravenous Once  . cholecalciferol  1,000 Units Oral Daily  . feeding supplement (ENSURE ENLIVE)  237 mL Oral BID BM  . ferrous sulfate  325 mg Oral Q breakfast  . ipratropium-albuterol  3 mL Nebulization Once  . metoprolol  25 mg Oral BID  . mometasone-formoterol  2 puff Inhalation BID  . multivitamin with minerals  1 tablet Oral Daily  . pantoprazole  40 mg Oral QAC breakfast  . potassium chloride  20 mEq Oral Daily  . pramipexole  0.5 mg Oral QHS  . sodium chloride flush  3 mL Intravenous Q12H  . venlafaxine XR  37.5 mg Oral Q breakfast  . vitamin B-12  5,000 mcg Oral Daily  . vitamin C  1,000 mg Oral Daily  . vitamin E  400 Units Oral Daily     Assessment/Plan:  1. Patient admitted with symptomatic anemia. Patient's lower GI bleed is from rectal prolapse irritation. Patient received 3 units of blood. Last hb 10.7. 2. Pre-op consult- no contraindications to surgery at this time. Rectal prolapse, for repair today. 3. Paroxysmal atrial fibrillation. On cardizem and lopressor 4. Hypertension. BP lower side today. 5. Anxiety on Xanax and Effexor 6. Chronic respiratory failure and COPD. Continue Advair and DuoNeb and oxygen supplementation. 7. Hypomagnesemia replaced 8. Hypokalemia replace potassium orally daily 9. GERD on Prilosec  Code Status:     Code Status Orders        Start     Ordered   09/06/16 0206  Do not attempt resuscitation (DNR)  Continuous    Question Answer Comment  In the event of cardiac or respiratory ARREST Do not call a "code blue"   In the event of cardiac or respiratory ARREST Do not perform Intubation, CPR, defibrillation or ACLS   In the event of cardiac or respiratory ARREST Use medication by any route, position, wound care, and other measures to relive pain and suffering. May use oxygen, suction and manual treatment of airway obstruction as needed for comfort.      09/06/16 0205    Code Status History    Date Active Date Inactive Code Status Order ID Comments User Context   09/06/2016  2:05 AM 09/06/2016  1:01 PM DNR [id number]  [name], DO Inpatient   09/06/2016  1:40 AM 09/06/2016  2:05 AM Full Code [id number]  [name], DO Inpatient   09/06/2016  1:40 AM 09/06/2016  1:40 AM DNR [id number]  [name], DO Inpatient   07/25/2016  8:20 AM 07/27/2016  5:29 PM Full Code [id number]  [name], MD Inpatient   07/22/2016 10:49 AM 07/24/2016  7:04 PM DNR [id number]  [name], MD Inpatient   07/21/2016 10:33 PM 07/22/2016 10:48 AM Full Code [id number]  [name], DO ED   07/21/2016 10:33 PM 07/21/2016 10:33 PM DNR [id number]  [name], DO ED   04/25/2016  3:51 AM  04/26/2016  7:38 PM DNR [id number]  [name], MD Inpatient   04/25/2016  3:29 AM 04/25/2016  3:29 AM Full Code [id number]  [name], MD ED    Questions for Most Recent Historical Code Status (Order [id number])    Question Answer Comment   In the event of cardiac or respiratory ARREST Do not call a "code blue"    In the event of cardiac or respiratory ARREST Do not perform Intubation, CPR, defibrillation or ACLS    In the event of cardiac or respiratory ARREST  Use medication by any route, position, wound care, and other measures to relive pain and suffering. May use oxygen, suction and manual treatment of airway obstruction as needed for comfort.         Advance Directive Documentation   Flowsheet Row Most Recent Value  Type of Advance Directive  Healthcare Power of Attorney, Living will  Pre-existing out of facility DNR order (yellow form or pink MOST form)  No data  "MOST" Form in Place?  No data     Disposition Plan: To be determined  Consultants:  Gen. Surgery  Cardiology consultation put in by admitting physician  Pulmonary put in by admitting physician  Time spent: 20 minutes  Loletha Grayer  [employer]

## 2016-09-09 NOTE — Progress Notes (Signed)
SUBJECTIVE: Patient is awake and alert. She denies any chest pain or pressure, shortness of breath or dizziness. She is anxious to undergo her surgery.   Vitals:   09/08/16 1313 09/08/16 2138 09/09/16 0500 09/09/16 0855  BP: 105/60 (!) 115/52 (!) 113/58 108/60  Pulse: 94 90 98 84  Resp: (!) 24 (!) 22 (!) 22   Temp: 97.9 F (36.6 C) 98.1 F (36.7 C) 97.7 F (36.5 C)   TempSrc: Oral Oral Oral   SpO2: 99% 99% 100%   Weight:      Height:        Intake/Output Summary (Last 24 hours) at 09/09/16 0904 Last data filed at 09/09/16 0831  Gross per 24 hour  Intake          1291.83 ml  Output              450 ml  Net           841.83 ml    LABS: Basic Metabolic Panel:  Recent Labs  09/06/16 1104 09/07/16 0526 09/08/16 0340  NA 143 142 139  K 3.5 4.3 4.1  CL 112* 102 99*  CO2 29 35* 37*  GLUCOSE 92 96 106*  BUN 19 25* 31*  CREATININE 0.78 0.88 0.94  CALCIUM 7.2* 8.6* 8.7*  MG 1.6* 2.0  --    Liver Function Tests: No results for input(s): AST, ALT, ALKPHOS, BILITOT, PROT, ALBUMIN in the last 72 hours. No results for input(s): LIPASE, AMYLASE in the last 72 hours. CBC:  Recent Labs  09/07/16 0526 09/08/16 0340  WBC 8.2 12.7*  HGB 9.5* 10.7*  HCT 28.4* 31.6*  MCV 105.6* 102.5*  PLT 139* 115*   Cardiac Enzymes: No results for input(s): CKTOTAL, CKMB, CKMBINDEX, TROPONINI in the last 72 hours. BNP: Invalid input(s): POCBNP D-Dimer: No results for input(s): DDIMER in the last 72 hours. Hemoglobin A1C: No results for input(s): HGBA1C in the last 72 hours. Fasting Lipid Panel: No results for input(s): CHOL, HDL, LDLCALC, TRIG, CHOLHDL, LDLDIRECT in the last 72 hours. Thyroid Function Tests: No results for input(s): TSH, T4TOTAL, T3FREE, THYROIDAB in the last 72 hours.  Invalid input(s): FREET3 Anemia Panel: No results for input(s): VITAMINB12, FOLATE, FERRITIN, TIBC, IRON, RETICCTPCT in the last 72 hours.   PHYSICAL EXAM General: Well developed, well  nourished, in no acute distress HEENT:  Normocephalic and atramatic Neck:  No JVD.  Lungs: Clear bilaterally to auscultation and percussion. Heart: HR irregular . Normal S1 and S2 without gallops or murmurs.  Abdomen: Bowel sounds are positive, abdomen soft and non-tender  Msk:  Back normal, normal gait. Normal strength and tone for age. Extremities: No clubbing, cyanosis or edema.   Neuro: Alert and oriented X 3. Psych:  Good affect, responds appropriately  TELEMETRY: Atrial fibrillation in the 90s  ASSESSMENT AND PLAN: Patient with anemia secondary to GI bleed related to rectal prolapse. Her anemia has been improved with hemoglobin over 10 and she is to have surgery for rectal prolapse repair today. She has had no further nonsustained V. tach and continues with rate controlled atrial fibrillation. She has moderate LV dysfunction with ejection fraction 40% and wall motion abnormalities suggestive of coronary artery disease. Patient is low to moderate risk for surgery, would advise proceeding. Providing she does well through her surgery, We'll plan to do outpatient cardiac workup including nuclear stress test.  Active Problems:   Symptomatic anemia   Protein-calorie malnutrition, severe    Daune Perch, NP 09/09/2016 9:04 AM

## 2016-09-09 NOTE — Op Note (Signed)
OPERATIVE REPORT  PREOPERATIVE  DIAGNOSIS: . Rectal prolapse  POSTOPERATIVE DIAGNOSIS: . Rectal prolapse  PROCEDURE: . Excision of rectal prolapse  ANESTHESIA:  General  SURGEON: Rochel Brome  MD   INDICATIONS: . She has history of rectal prolapse dating back to May of this year. This is been very symptomatic. She is also had some minimal rectal bleeding. Surgery was recommended for definitive treatment.  With the patient on the operating table in the supine position under general anesthesia the legs were elevated into the lithotomy position using ankle straps. The anal area was prepared with Betadine solution. The rectum spontaneously prolapsed during preparation. The site was draped with sterile towels and sheets.  The prolapsed rectum was examined and protruded out approximately 6 cm. The vaginal wall was palpated and was not included in the prolapse. The anal sphincter could be palpated. Traction was applied as the GIA 75 mm stapler was applied in a longitudinal direction on the patient's left side up to the sphincter and engaged and activated. This was repeated on the patient's right side. Seeing the  flap of rectum which was anterior this was divided with a horizontal oriented staple line with 2 loads. Next the posterior flap was removed with 2 loads of the GIA stapler. The specimen was submitted for pathology. There was one tiny benign-appearing polyp on the specimen. There was no significant bleeding. Estimated blood loss less than 1 cc. The staple line was allowed to retract back back up to the normal anatomic position. The patency of the anal canal was further demonstrated with digital examination.  The patient appeared to tolerate the procedure satisfactorily and was surgically prepared for transfer to the recovery room  [facility].D.

## 2016-09-09 NOTE — Anesthesia Preprocedure Evaluation (Signed)
Anesthesia Evaluation  Patient identified by MRN, date of birth, ID band Patient awake    Reviewed: Allergy & Precautions, H&P , NPO status , Patient's Chart, lab work & pertinent test results  History of Anesthesia Complications Negative for: history of anesthetic complications  Airway Mallampati: III  TM Distance: <3 FB Neck ROM: limited    Dental no notable dental hx. (+) Poor Dentition, Missing, Edentulous Upper   Pulmonary COPD,  COPD inhaler and oxygen dependent, former smoker,    Pulmonary exam normal breath sounds clear to auscultation       Cardiovascular Exercise Tolerance: Poor (-) angina+ Peripheral Vascular Disease  (-) Past MI negative cardio ROS Normal cardiovascular exam Rhythm:regular Rate:Normal     Neuro/Psych negative neurological ROS  negative psych ROS   GI/Hepatic negative GI ROS, Neg liver ROS, neg GERD  ,  Endo/Other  negative endocrine ROS  Renal/GU      Musculoskeletal   Abdominal   Peds  Hematology negative hematology ROS (+)   Anesthesia Other Findings Past Medical History: No date: Anemia No date: COPD (chronic obstructive pulmonary disease) (* 1997: Lung cancer, upper lobe (HCC)     Comment: right upper lobe No date: Prolapse of intestine No date: Raynaud's disease  Past Surgical History: No date: ABDOMINAL HYSTERECTOMY No date: CESAREAN SECTION No date: CHOLECYSTECTOMY 04/25/2016: FLEXIBLE SIGMOIDOSCOPY N/A     Comment: Procedure: FLEXIBLE SIGMOIDOSCOPY;  Surgeon:               Lucilla Lame, MD;  Location: [facility] ENDOSCOPY;                Service: Endoscopy;  Laterality: N/A; No date: JOINT REPLACEMENT 1997: LUNG SURGERY Right     Comment: RUL removed  BMI    Body Mass Index:  24.94 kg/m      Reproductive/Obstetrics negative OB ROS                             Anesthesia Physical Anesthesia Plan  ASA: IV  Anesthesia Plan: General ETT    Post-op Pain Management:    Induction:   Airway Management Planned:   Additional Equipment:   Intra-op Plan:   Post-operative Plan:   Informed Consent: I have reviewed the patients History and Physical, chart, labs and discussed the procedure including the risks, benefits and alternatives for the proposed anesthesia with the patient or authorized representative who has indicated his/her understanding and acceptance.     Plan Discussed with: Anesthesiologist, CRNA and Surgeon  Anesthesia Plan Comments: (Patient and family informed that patient is higher risk for complications from anesthesia during this procedure due to their medical history and age including but not limited to post operative cognitive dysfunction and post operative ventilation.  They voiced understanding. )        Anesthesia Quick Evaluation

## 2016-09-09 NOTE — Progress Notes (Signed)
I spoke with Pecolia Ades NP with [facility].  She said to give metoprolol this am

## 2016-09-09 NOTE — Anesthesia Procedure Notes (Signed)
Procedure Name: Intubation Date/Time: 09/09/2016 1:13 PM Performed by: Dionne Bucy Pre-anesthesia Checklist: Patient identified, Patient being monitored, Timeout performed, Emergency Drugs available and Suction available Patient Re-evaluated:Patient Re-evaluated prior to inductionOxygen Delivery Method: Circle system utilized Preoxygenation: Pre-oxygenation with 100% oxygen Intubation Type: IV induction, Rapid sequence and Cricoid Pressure applied Ventilation: Mask ventilation without difficulty Laryngoscope Size: Mac and 3 Grade View: Grade I Tube type: Oral Tube size: 7.0 mm Number of attempts: 1 Airway Equipment and Method: Stylet Placement Confirmation: ETT inserted through vocal cords under direct vision,  positive ETCO2 and breath sounds checked- equal and bilateral Secured at: 21 cm Tube secured with: Tape Dental Injury: Teeth and Oropharynx as per pre-operative assessment

## 2016-09-09 NOTE — Progress Notes (Signed)
Pt sent to the OR via her bed by the OR orderly.  cefotan on the chart

## 2016-09-10 ENCOUNTER — Inpatient Hospital Stay: Payer: PPO

## 2016-09-10 LAB — CBC
HEMATOCRIT: 31.3 % — AB (ref 35.0–47.0)
Hemoglobin: 10.5 g/dL — ABNORMAL LOW (ref 12.0–16.0)
MCH: 34.9 pg — AB (ref 26.0–34.0)
MCHC: 33.5 g/dL (ref 32.0–36.0)
MCV: 104.2 fL — AB (ref 80.0–100.0)
Platelets: 119 10*3/uL — ABNORMAL LOW (ref 150–440)
RBC: 3 MIL/uL — ABNORMAL LOW (ref 3.80–5.20)
RDW: 20.1 % — AB (ref 11.5–14.5)
WBC: 12 10*3/uL — ABNORMAL HIGH (ref 3.6–11.0)

## 2016-09-10 LAB — BASIC METABOLIC PANEL
Anion gap: 4 — ABNORMAL LOW (ref 5–15)
BUN: 34 mg/dL — AB (ref 6–20)
CALCIUM: 9 mg/dL (ref 8.9–10.3)
CO2: 37 mmol/L — AB (ref 22–32)
CREATININE: 1.03 mg/dL — AB (ref 0.44–1.00)
Chloride: 96 mmol/L — ABNORMAL LOW (ref 101–111)
GFR calc non Af Amer: 47 mL/min — ABNORMAL LOW (ref >60)
GFR, EST AFRICAN AMERICAN: 55 mL/min — AB (ref >60)
GLUCOSE: 123 mg/dL — AB (ref 65–99)
Potassium: 5 mmol/L (ref 3.5–5.1)
Sodium: 137 mmol/L (ref 135–145)

## 2016-09-10 MED ORDER — IPRATROPIUM-ALBUTEROL 0.5-2.5 (3) MG/3ML IN SOLN
3.0000 mL | Freq: Three times a day (TID) | RESPIRATORY_TRACT | Status: DC
  Administered 2016-09-11 (×2): 3 mL via RESPIRATORY_TRACT
  Filled 2016-09-10 (×2): qty 3

## 2016-09-10 MED ORDER — FUROSEMIDE 10 MG/ML IJ SOLN
40.0000 mg | Freq: Once | INTRAMUSCULAR | Status: AC
  Administered 2016-09-10: 40 mg via INTRAVENOUS
  Filled 2016-09-10: qty 4

## 2016-09-10 MED ORDER — IPRATROPIUM-ALBUTEROL 0.5-2.5 (3) MG/3ML IN SOLN
3.0000 mL | Freq: Four times a day (QID) | RESPIRATORY_TRACT | Status: DC
  Administered 2016-09-10 (×3): 3 mL via RESPIRATORY_TRACT
  Filled 2016-09-10 (×3): qty 3

## 2016-09-10 NOTE — Evaluation (Signed)
Physical Therapy Evaluation Patient Details Name: Sara Mccoy MRN: [id number] DOB: [birth year] Today's Date: 09/10/2016   History of Present Illness  Pt is a 82 y/o F admitted with symptomatic anemia due to rectal prolapse.  She is now s/p rectal prolapse surgery on 09/09/16.  Pt recently admitted to hospital in 07/2016 due to onset of anemia and respiratory failure.  From there went to [facility] where she stayed until ~9/28 pre pt report. Pt's PMH includes prolapse of intestine, lung cancer (upper lobe), COPD, anemia.     Clinical Impression  Patient is s/p above surgery resulting in functional limitations due to the deficits listed below (see PT Problem List). Ms. Gardiner presents with generalized weakness and requires close min guard for short distance ambulation (40 ft).  She fatigues quickly and SpO2 drops to 87% on 2L O2 while ambulating.  She was recently d/c from [facility] following previous hospital stay and expresses desire to return to [facility].  Given pt's current mobility status and increased fall risk, recommending SNF upon d/c. Patient will benefit from skilled PT to increase their independence and safety with mobility to allow discharge to the venue listed below.      Follow Up Recommendations SNF;Supervision/Assistance - 24 hour    Equipment Recommendations  None recommended by PT    Recommendations for Other Services OT consult     Precautions / Restrictions Precautions Precautions: Fall Precaution Comments: O2 dependent, 3L at home per pt Restrictions Weight Bearing Restrictions: No      Mobility  Bed Mobility Overal bed mobility: Modified Independent             General bed mobility comments: Increased time and effort  Transfers Overall transfer level: Needs assistance Equipment used: Rolling walker (2 wheeled) Transfers: Sit to/from Stand Sit to Stand: Min assist         General transfer comment: Requires min  assist after multiple unsuccessful attempts to stand from commode despite pushing from armrest.    Ambulation/Gait Ambulation/Gait assistance: Min guard Ambulation Distance (Feet): 40 Feet Assistive device: Rolling walker (2 wheeled) Gait Pattern/deviations: Step-through pattern;Decreased stride length;Antalgic;Trunk flexed Gait velocity: decreased Gait velocity interpretation: <1.8 ft/sec, indicative of risk for recurrent falls General Gait Details: Very decreased gait speed and flexed posture despite cues for upright posture and forward gaze.  Pt occasionally leaning on R elbow on RW, cues provided to place both hands on RW at all times.  SpO2 down to 87% on 2L O2 while ambulating.  Increased O2 to 3L with immediate SpO2 improvement to 98%.   Cues for pursed lip breathing.  Stairs            Wheelchair Mobility    Modified Rankin (Stroke Patients Only)       Balance Overall balance assessment: Needs assistance Sitting-balance support: No upper extremity supported;Feet supported Sitting balance-Leahy Scale: Fair     Standing balance support: Bilateral upper extremity supported;During functional activity Standing balance-Leahy Scale: Fair                               Pertinent Vitals/Pain Pain Assessment: No/denies pain    Home Living Family/patient expects to be discharged to:: Skilled nursing facility Living Arrangements: Other relatives (granddaughter) Available Help at Discharge: Family;Available PRN/intermittently (grandaughter works during day) Type of Home: House Home Access: Stairs to enter Entrance Stairs-Rails: Horticulturist, commercial of Steps: 5 Home Layout: One level Home Equipment: Environmental consultant -  4 wheels;Bedside commode;Shower seat      Prior Function Level of Independence: Independent with assistive device(s)         Comments: Pt has outside assist for cleaning.  She reports she does her own cooking.  Has not driven for ~5 years.        Hand Dominance        Extremity/Trunk Assessment   Upper Extremity Assessment: Generalized weakness           Lower Extremity Assessment: Generalized weakness      Cervical / Trunk Assessment: Kyphotic  Communication   Communication: HOH  Cognition Arousal/Alertness: Awake/alert Behavior During Therapy: WFL for tasks assessed/performed Overall Cognitive Status: Within Functional Limits for tasks assessed                      General Comments      Exercises     Assessment/Plan    PT Assessment Patient needs continued PT services  PT Problem List Decreased strength;Decreased activity tolerance;Decreased balance;Decreased knowledge of use of DME;Decreased safety awareness;Cardiopulmonary status limiting activity          PT Treatment Interventions DME instruction;Gait training;Stair training;Functional mobility training;Therapeutic activities;Therapeutic exercise;Balance training;Patient/family education    PT Goals (Current goals can be found in the Care Plan section)  Acute Rehab PT Goals Patient Stated Goal: rehab before home PT Goal Formulation: With patient Time For Goal Achievement: 09/24/16 Potential to Achieve Goals: Good    Frequency Min 2X/week   Barriers to discharge Inaccessible home environment;Decreased caregiver support Alone during the day and 5 steps to enter home    Co-evaluation               End of Session Equipment Utilized During Treatment: Gait belt Activity Tolerance: Patient limited by fatigue Patient left: in chair;with call bell/phone within reach;with chair alarm set Nurse Communication: Mobility status;Other (comment) (SpO2)         Time: 0131-4388 PT Time Calculation (min) (ACUTE ONLY): 28 min   Charges:   PT Evaluation $PT Eval Moderate Complexity: 1 Procedure PT Treatments $Therapeutic Activity: 8-22 mins   PT G Codes:        Collie Siad PT, DPT 09/10/2016, 4:38 PM

## 2016-09-10 NOTE — Progress Notes (Signed)
She is awake alert and oriented this morning. She reports breathing satisfactorily. She reports no anorectal pain.  Vital signs are stable. Oxygen saturation is 100% on 2 L O2 supplement. Lung sounds are clear. Her dressing was removed and did have some serosanguineous drainage and also was saturated with urine. External inspection of the anus appears normal.  Impression satisfactory progress 1 day after excision of prolapsed rectum  We are not using subcutaneous heparin as she does have a mild coagulopathy. Plan for today is to have regular diet, walk with a walker. Observe to see that she has a bowel movement prior to discharge. Possibly can be discharged in 1 or 2 days from now.

## 2016-09-10 NOTE — Progress Notes (Signed)
  SUBJECTIVE: Patient is alert and oriented and doing well this morning. She has no chest pain or shortness of breath.   Vitals:   09/09/16 1616 09/09/16 1713 09/09/16 2100 09/10/16 0436  BP: (!) 117/54 (!) 120/54 120/65 (!) 124/58  Pulse: 74 79 97 86  Resp: '16 14 17 17  '$ Temp: 97.8 F (36.6 C) 98.1 F (36.7 C) 98.3 F (36.8 C) 97.8 F (36.6 C)  TempSrc: Oral Oral Oral Oral  SpO2: 90% 94% 99% 100%  Weight:      Height:        Intake/Output Summary (Last 24 hours) at 09/10/16 0847 Last data filed at 09/10/16 0754  Gross per 24 hour  Intake             1300 ml  Output             1155 ml  Net              145 ml    LABS: Basic Metabolic Panel:  Recent Labs  09/08/16 0340 09/10/16 0536  NA 139 137  K 4.1 5.0  CL 99* 96*  CO2 37* 37*  GLUCOSE 106* 123*  BUN 31* 34*  CREATININE 0.94 1.03*  CALCIUM 8.7* 9.0   Liver Function Tests: No results for input(s): AST, ALT, ALKPHOS, BILITOT, PROT, ALBUMIN in the last 72 hours. No results for input(s): LIPASE, AMYLASE in the last 72 hours. CBC:  Recent Labs  09/08/16 0340 09/10/16 0536  WBC 12.7* 12.0*  HGB 10.7* 10.5*  HCT 31.6* 31.3*  MCV 102.5* 104.2*  PLT 115* 119*   Cardiac Enzymes: No results for input(s): CKTOTAL, CKMB, CKMBINDEX, TROPONINI in the last 72 hours. BNP: Invalid input(s): POCBNP D-Dimer: No results for input(s): DDIMER in the last 72 hours. Hemoglobin A1C: No results for input(s): HGBA1C in the last 72 hours. Fasting Lipid Panel: No results for input(s): CHOL, HDL, LDLCALC, TRIG, CHOLHDL, LDLDIRECT in the last 72 hours. Thyroid Function Tests: No results for input(s): TSH, T4TOTAL, T3FREE, THYROIDAB in the last 72 hours.  Invalid input(s): FREET3 Anemia Panel: No results for input(s): VITAMINB12, FOLATE, FERRITIN, TIBC, IRON, RETICCTPCT in the last 72 hours.   PHYSICAL EXAM General: Well developed, well nourished, in no acute distress HEENT:  Normocephalic and atramatic Neck:  No JVD.   Lungs: Clear bilaterally to auscultation and percussion. Heart: HR irregular . Normal S1 and S2 without gallops or murmurs.  Abdomen: Bowel sounds are positive, abdomen soft and non-tender  Msk:  Back normal, normal gait. Normal strength and tone for age. Extremities: No clubbing, cyanosis or edema.   Neuro: Alert and oriented X 3. Psych:  Good affect, responds appropriately  TELEMETRY: Non-telemetry  ASSESSMENT AND PLAN: Patient with anemia secondary to GI bleed related to rectal prolapse. Her anemia has been improved with hemoglobin over 10 and she is now status post rectal prolapse surgery. She has had no further nonsustained V. tach and continues with rate controlled atrial fibrillation. She has moderate LV dysfunction with ejection fraction 40% and wall motion abnormalities suggestive of coronary artery disease. We'll plan to do outpatient cardiac workup including nuclear stress test.   Active Problems:   Symptomatic anemia   Protein-calorie malnutrition, severe    Daune Perch, NP 09/10/2016 8:47 AM

## 2016-09-10 NOTE — Progress Notes (Signed)
Patient ID: Sara Mccoy, female   DOB: [birth year], 82 y.o.   MRN: [id number]   [employer] PROGRESS NOTE  [facility] NUU:[id number] DOB: [birth year] DOA: 09/05/2016 PCP: Tracie Harrier, MD  HPI/Subjective: Patient seen earlier this morning. She only had some burning in the rectum. No abdominal pain. She states that her breathing is hard and she feels a little bit weak. She also had some confusion this morning.  Objective: Vitals:   09/10/16 0436 09/10/16 1233  BP: (!) 124/58 (!) 122/58  Pulse: 86 96  Resp: 17 (!) 24  Temp: 97.8 F (36.6 C) 98.5 F (36.9 C)    Filed Weights   09/05/16 2039  Weight: 59.9 kg (132 lb)    ROS: Review of Systems  Constitutional: Negative for chills and fever.  Eyes: Negative for blurred vision.  Respiratory: Positive for shortness of breath. Negative for cough.   Cardiovascular: Negative for chest pain.  Gastrointestinal: Negative for abdominal pain, constipation, diarrhea, nausea and vomiting.  Genitourinary: Negative for dysuria.  Musculoskeletal: Negative for joint pain.  Neurological: Positive for weakness. Negative for dizziness and headaches.   Exam: Physical Exam  Constitutional: She is oriented to person, place, and time.  HENT:  Nose: No mucosal edema.  Mouth/Throat: No oropharyngeal exudate or posterior oropharyngeal edema.  Eyes: Conjunctivae, EOM and lids are normal. Pupils are equal, round, and reactive to light.  Neck: No JVD present. Carotid bruit is not present. No edema present. No thyroid mass and no thyromegaly present.  Cardiovascular: S1 normal and S2 normal.  Exam reveals no gallop.   Murmur heard.  Systolic murmur is present with a grade of 2/6  Pulses:      Dorsalis pedis pulses are 2+ on the right side, and 2+ on the left side.  Respiratory: No respiratory distress. She has decreased breath sounds in the right lower field and the left lower field. She has no wheezes. She has no  rhonchi. She has rales in the right lower field and the left lower field.  GI: Soft. Bowel sounds are normal. There is no tenderness.  Musculoskeletal:       Right ankle: She exhibits no swelling.       Left ankle: She exhibits no swelling.  Lymphadenopathy:    She has no cervical adenopathy.  Neurological: She is alert and oriented to person, place, and time. No cranial nerve deficit.  Skin: Skin is warm. No rash noted. Nails show no clubbing.  Psychiatric: She has a normal mood and affect.      Data Reviewed: Basic Metabolic Panel:  Recent Labs Lab 09/05/16 2047 09/06/16 1104 09/07/16 0526 09/08/16 0340 09/10/16 0536  NA 140 143 142 139 137  K 4.1 3.5 4.3 4.1 5.0  CL 103 112* 102 99* 96*  CO2 31 29 35* 37* 37*  GLUCOSE 95 92 96 106* 123*  BUN 24* 19 25* 31* 34*  CREATININE 0.99 0.78 0.88 0.94 1.03*  CALCIUM 8.8* 7.2* 8.6* 8.7* 9.0  MG 1.9 1.6* 2.0  --   --   PHOS 3.7  --   --   --   --    Liver Function Tests:  Recent Labs Lab 09/05/16 2047  AST 21  ALT 16  ALKPHOS 62  BILITOT 0.7  PROT 6.0*  ALBUMIN 3.4*   CBC:  Recent Labs Lab 09/03/16 1608 09/05/16 2047 09/06/16 1104 09/07/16 0526 09/08/16 0340 09/10/16 0536  WBC 8.9 8.5 6.6 8.2 12.7* 12.0*  NEUTROABS 6.4  --   --   --   --   --  HGB 7.6* 8.2* 8.5* 9.5* 10.7* 10.5*  HCT 22.6* 24.7* 25.7* 28.4* 31.6* 31.3*  MCV 109.6* 109.8* 105.6* 105.6* 102.5* 104.2*  PLT 194 177 131* 139* 115* 119*   BNP (last 3 results)  Recent Labs  04/25/16 0106 05/15/16 1830  BNP 318.0* 363.0*     Scheduled Meds: . calcium-vitamin D  1 tablet Oral BID  . cholecalciferol  1,000 Units Oral Daily  . feeding supplement (ENSURE ENLIVE)  237 mL Oral BID BM  . ferrous sulfate  325 mg Oral Q breakfast  . ipratropium-albuterol  3 mL Nebulization Q6H  . metoprolol  25 mg Oral BID  . mometasone-formoterol  2 puff Inhalation BID  . multivitamin with minerals  1 tablet Oral Daily  . pantoprazole  40 mg Oral QAC  breakfast  . pramipexole  0.5 mg Oral QHS  . sodium chloride flush  3 mL Intravenous Q12H  . venlafaxine XR  37.5 mg Oral Q breakfast  . vitamin B-12  5,000 mcg Oral Daily  . vitamin C  1,000 mg Oral Daily  . vitamin E  400 Units Oral Daily    Assessment/Plan:  1. Patient admitted with symptomatic anemia. Patient's lower GI bleed is from rectal prolapse irritation. Patient received 3 units of blood. Last hb 10.5. 2. Rectal prolapse surgery. Patient doing well. As per surgeon's note may be able to go home in a day or so. Fluid overload after surgery. Stop IV fluids and give 1 dose of Lasix. 3. Paroxysmal atrial fibrillation. On cardizem and lopressor 4. Hypertension. Blood pressure stable today. 5. Anxiety on Xanax and Effexor 6. Chronic respiratory failure and COPD. Continue Advair and DuoNeb and oxygen supplementation. One dose of Lasix given today. 7. Hypomagnesemia replaced 8. Hypokalemia replaced 9. GERD on Prilosec 10. Weakness. Physical therapy evaluation  Code Status:     Code Status Orders        Start     Ordered   09/06/16 0206  Do not attempt resuscitation (DNR)  Continuous    Question Answer Comment  In the event of cardiac or respiratory ARREST Do not call a "code blue"   In the event of cardiac or respiratory ARREST Do not perform Intubation, CPR, defibrillation or ACLS   In the event of cardiac or respiratory ARREST Use medication by any route, position, wound care, and other measures to relive pain and suffering. May use oxygen, suction and manual treatment of airway obstruction as needed for comfort.      09/06/16 0205    Code Status History    Date Active Date Inactive Code Status Order ID Comments User Context   09/06/2016  2:05 AM 09/06/2016  1:01 PM DNR [id number]  [name], DO Inpatient   09/06/2016  1:40 AM 09/06/2016  2:05 AM Full Code [id number]  [name], DO Inpatient   09/06/2016  1:40 AM 09/06/2016  1:40 AM DNR [id number]  [name], DO Inpatient   07/25/2016  8:20 AM 07/27/2016  5:29 PM Full Code [id number]  [name], MD Inpatient   07/22/2016 10:49 AM 07/24/2016  7:04 PM DNR [id number]  [name], MD Inpatient   07/21/2016 10:33 PM 07/22/2016 10:48 AM Full Code [id number]  [name], DO ED   07/21/2016 10:33 PM 07/21/2016 10:33 PM DNR [id number]  [name], DO ED   04/25/2016  3:51 AM 04/26/2016  7:38 PM DNR [id number]  [name], MD Inpatient   04/25/2016  3:29 AM 04/25/2016  3:29 AM Full Code [id number]  [name], MD ED    Questions for Most Recent Historical Code Status (Order [id number])    Question Answer Comment   In the event of cardiac or respiratory ARREST Do not call a "code blue"    In the event of cardiac or respiratory ARREST Do not perform Intubation, CPR, defibrillation or ACLS    In the event of cardiac or respiratory ARREST Use medication by any route, position, wound care, and other measures to relive pain and suffering. May use oxygen, suction and manual treatment of airway obstruction as needed for comfort.         Advance Directive Documentation   Flowsheet Row Most Recent Value  Type of Advance Directive  Healthcare Power of Attorney, Living will  Pre-existing out of facility DNR order (yellow form or pink MOST form)  No data  "MOST" Form in Place?  No data     Disposition Plan: Potentially home in the next day or so.  Consultants:  Gen. Surgery  Cardiology consultation put in by admitting physician  Time spent: 20 minutes  Loletha Grayer  [employer]

## 2016-09-11 ENCOUNTER — Inpatient Hospital Stay: Payer: PPO

## 2016-09-11 LAB — BASIC METABOLIC PANEL
Anion gap: 5 (ref 5–15)
BUN: 41 mg/dL — AB (ref 6–20)
CALCIUM: 8.6 mg/dL — AB (ref 8.9–10.3)
CHLORIDE: 93 mmol/L — AB (ref 101–111)
CO2: 40 mmol/L — AB (ref 22–32)
CREATININE: 1.02 mg/dL — AB (ref 0.44–1.00)
GFR calc Af Amer: 55 mL/min — ABNORMAL LOW (ref >60)
GFR calc non Af Amer: 48 mL/min — ABNORMAL LOW (ref >60)
Glucose, Bld: 104 mg/dL — ABNORMAL HIGH (ref 65–99)
Potassium: 3.9 mmol/L (ref 3.5–5.1)
SODIUM: 138 mmol/L (ref 135–145)

## 2016-09-11 LAB — SURGICAL PATHOLOGY

## 2016-09-11 MED ORDER — OXYCODONE-ACETAMINOPHEN 5-325 MG PO TABS: 1.0000 | ORAL_TABLET | Freq: Two times a day (BID) | ORAL | 0 refills | Status: AC | PRN

## 2016-09-11 MED ORDER — FUROSEMIDE 20 MG PO TABS: 20.0000 mg | ORAL_TABLET | Freq: Every day | ORAL | 0 refills | Status: DC

## 2016-09-11 MED ORDER — DILTIAZEM HCL 60 MG PO TABS
120.0000 mg | ORAL_TABLET | Freq: Every day | ORAL | Status: DC
  Administered 2016-09-11: 120 mg via ORAL
  Filled 2016-09-11: qty 2

## 2016-09-11 MED ORDER — ENSURE ENLIVE PO LIQD: 237.0000 mL | Freq: Two times a day (BID) | ORAL | 0 refills | Status: DC

## 2016-09-11 MED ORDER — FUROSEMIDE 10 MG/ML IJ SOLN
20.0000 mg | Freq: Once | INTRAMUSCULAR | Status: AC
  Administered 2016-09-11: 20 mg via INTRAVENOUS
  Filled 2016-09-11: qty 2

## 2016-09-11 MED ORDER — ALPRAZOLAM 0.5 MG PO TABS: 0.5000 mg | ORAL_TABLET | Freq: Every evening | ORAL | 0 refills | Status: AC | PRN

## 2016-09-11 MED ORDER — METOPROLOL TARTRATE 50 MG PO TABS
50.0000 mg | ORAL_TABLET | Freq: Two times a day (BID) | ORAL | Status: DC
  Administered 2016-09-11: 50 mg via ORAL
  Filled 2016-09-11: qty 1

## 2016-09-11 MED ORDER — BUDESONIDE 0.25 MG/2ML IN SUSP: 0.2500 mg | Freq: Two times a day (BID) | RESPIRATORY_TRACT | Status: DC

## 2016-09-11 MED ORDER — DILTIAZEM HCL 120 MG PO TABS: 120.0000 mg | ORAL_TABLET | Freq: Every day | ORAL | 0 refills | Status: DC

## 2016-09-11 NOTE — Progress Notes (Signed)
SUBJECTIVE: Patient is feeling much better after surgery with no chest pain or shortness of breath   Vitals:   09/10/16 2106 09/10/16 2200 09/11/16 0537 09/11/16 0719  BP: (!) 103/47 (!) 110/58 107/61   Pulse: 86  96   Resp: 18  18   Temp: 98 F (36.7 C)  98.1 F (36.7 C)   TempSrc: Oral  Oral   SpO2: 99%  99% 96%  Weight:      Height:        Intake/Output Summary (Last 24 hours) at 09/11/16 0831 Last data filed at 09/11/16 0500  Gross per 24 hour  Intake              723 ml  Output             1200 ml  Net             -477 ml    LABS: Basic Metabolic Panel:  Recent Labs  09/10/16 0536 09/11/16 0453  NA 137 138  K 5.0 3.9  CL 96* 93*  CO2 37* 40*  GLUCOSE 123* 104*  BUN 34* 41*  CREATININE 1.03* 1.02*  CALCIUM 9.0 8.6*   Liver Function Tests: No results for input(s): AST, ALT, ALKPHOS, BILITOT, PROT, ALBUMIN in the last 72 hours. No results for input(s): LIPASE, AMYLASE in the last 72 hours. CBC:  Recent Labs  09/10/16 0536  WBC 12.0*  HGB 10.5*  HCT 31.3*  MCV 104.2*  PLT 119*   Cardiac Enzymes: No results for input(s): CKTOTAL, CKMB, CKMBINDEX, TROPONINI in the last 72 hours. BNP: Invalid input(s): POCBNP D-Dimer: No results for input(s): DDIMER in the last 72 hours. Hemoglobin A1C: No results for input(s): HGBA1C in the last 72 hours. Fasting Lipid Panel: No results for input(s): CHOL, HDL, LDLCALC, TRIG, CHOLHDL, LDLDIRECT in the last 72 hours. Thyroid Function Tests: No results for input(s): TSH, T4TOTAL, T3FREE, THYROIDAB in the last 72 hours.  Invalid input(s): FREET3 Anemia Panel: No results for input(s): VITAMINB12, FOLATE, FERRITIN, TIBC, IRON, RETICCTPCT in the last 72 hours.   PHYSICAL EXAM General: Well developed, well nourished, in no acute distress HEENT:  Normocephalic and atramatic Neck:  No JVD.  Lungs: Clear bilaterally to auscultation and percussion. Heart: HRRR . Normal S1 and S2 without gallops or murmurs.  Abdomen:  Bowel sounds are positive, abdomen soft and non-tender  Msk:  Back normal, normal gait. Normal strength and tone for age. Extremities: No clubbing, cyanosis or edema.   Neuro: Alert and oriented X 3. Psych:  Good affect, responds appropriately  TELEMETRY:A. fib with controlled ventricular rate between 90 and 100  ASSESSMENT AND PLAN: Chronic atrial fibrillation with controlled ventricular rate and metoprolol changed from 25 twice a day to 50 twice a day which will control rate even better. Patient denies any chest pain or shortness of breath after rectal surgery day #2. May go home with follow-up in the office. Patient already given appointment for follow-up in the office. We will sign off.  Active Problems:   Symptomatic anemia   Protein-calorie malnutrition, severe    Jaclyn Andy A, MD, [facility] 09/11/2016 8:31 AM

## 2016-09-11 NOTE — Progress Notes (Signed)
Physical Therapy Treatment Patient Details Name: Sara Mccoy MRN: [id number] DOB: 01-30-1926 Today's Date: 09/11/2016    History of Present Illness Pt is a 82 y/o F admitted with symptomatic anemia due to rectal prolapse.  She is now s/p rectal prolapse surgery on 09/09/16.  Pt recently admitted to hospital in 07/2016 due to onset of anemia and respiratory failure.  From there went to [facility] where she stayed until ~9/28 pre pt report. Pt's PMH includes prolapse of intestine, lung cancer (upper lobe), COPD, anemia.     PT Comments    Sara Mccoy is making modest progress toward therapy goals.  She requires min assist to boost up to standing from bed and BSC.  Close min guard assist provided while ambulating as pt demonstrates flexed posture and fatigues quickly.  Recommendation for SNF at d/c remains appropriate.   Follow Up Recommendations  SNF;Supervision/Assistance - 24 hour     Equipment Recommendations  None recommended by PT    Recommendations for Other Services OT consult     Precautions / Restrictions Precautions Precautions: Fall Precaution Comments: O2 dependent, 3L at home per pt Restrictions Weight Bearing Restrictions: No    Mobility  Bed Mobility Overal bed mobility: Modified Independent             General bed mobility comments: Increased time and effort  Transfers Overall transfer level: Needs assistance Equipment used: Rolling walker (2 wheeled) Transfers: Sit to/from Stand Sit to Stand: Min assist         General transfer comment: Requires min assist to steady during sit>stand.  Cues to turn and back up completely to chair prior to sitting.  Ambulation/Gait Ambulation/Gait assistance: Min guard Ambulation Distance (Feet): 50 Feet Assistive device: Rolling walker (2 wheeled) Gait Pattern/deviations: Step-through pattern;Decreased stride length;Trunk flexed Gait velocity: decreased Gait velocity interpretation: <1.8  ft/sec, indicative of risk for recurrent falls General Gait Details: Cues for upright posture and forward gaze as pt demonstrates flexed posture.  Pt fatigues after ambulating ~25 ft and requests to return to room.  3L O2 via [place] while ambulating.   Stairs            Wheelchair Mobility    Modified Rankin (Stroke Patients Only)       Balance Overall balance assessment: Needs assistance Sitting-balance support: No upper extremity supported;Feet supported Sitting balance-Leahy Scale: Fair     Standing balance support: Bilateral upper extremity supported;During functional activity Standing balance-Leahy Scale: Fair Standing balance comment: RW for support                    Cognition Arousal/Alertness: Awake/alert Behavior During Therapy: WFL for tasks assessed/performed Overall Cognitive Status: Within Functional Limits for tasks assessed                      Exercises General Exercises - Upper Extremity Shoulder Flexion: AROM;Both;20 reps;Seated General Exercises - Lower Extremity Ankle Circles/Pumps: AROM;Both;10 reps;Seated Long Arc Quad: AROM;Both;10 reps;Seated Hip Flexion/Marching: Both;10 reps;Seated    General Comments        Pertinent Vitals/Pain Pain Assessment: No/denies pain    Home Living                      Prior Function            PT Goals (current goals can now be found in the care plan section) Acute Rehab PT Goals Patient Stated Goal: rehab before home PT Goal Formulation:  With patient Time For Goal Achievement: 09/24/16 Potential to Achieve Goals: Good Progress towards PT goals: Progressing toward goals    Frequency    Min 2X/week      PT Plan Current plan remains appropriate    Co-evaluation             End of Session Equipment Utilized During Treatment: Gait belt;Oxygen Activity Tolerance: Patient limited by fatigue;Patient tolerated treatment well Patient left: in chair;with call  bell/phone within reach;with chair alarm set;with nursing/sitter in room     Time: 1030-1314 PT Time Calculation (min) (ACUTE ONLY): 31 min  Charges:  $Gait Training: 8-22 mins $Therapeutic Exercise: 8-22 mins                    G Codes:      Sara Mccoy PT, DPT 09/11/2016, 3:34 PM

## 2016-09-11 NOTE — NC FL2 (Signed)
[place] LEVEL OF CARE SCREENING TOOL     IDENTIFICATION  Patient Name: Sara Mccoy [facility] Birthdate: [birth year] Sex: female Admission Date (Current Location): 09/05/2016  [place] and [place] Number:  Engineering geologist and Address:  [facility], [address], [place], [place] [address]      Provider Number: [id number]  Attending Physician Name and Address:  Loletha Grayer, MD  Relative Name and Phone Number:       Current Level of Care: Hospital Recommended Level of Care: [place] Prior Approval Number:    Date Approved/Denied:   PASRR Number: [id number]  Discharge Plan: SNF    Current Diagnoses: Patient Active Problem List   Diagnosis Date Noted  . Protein-calorie malnutrition, severe 09/08/2016  . Rectal prolapse 09/05/2016  . Acute respiratory failure ([place]) 07/22/2016  . Symptomatic anemia 07/21/2016  . GI bleed 04/25/2016  . Abdominal mass   . Absolute anemia   . Blood in stool   . Diverticulosis of large intestine without diverticulitis     Orientation RESPIRATION BLADDER Height & Weight     Self, Time, Situation, Place  Normal, O2 (2 liters) Continent Weight: 132 lb (59.9 kg) Height:  '5\' 1"'$  (154.9 cm)  BEHAVIORAL SYMPTOMS/MOOD NEUROLOGICAL BOWEL NUTRITION STATUS   (none)  (none) Continent Diet (regular)  AMBULATORY STATUS COMMUNICATION OF NEEDS Skin   Limited Assist Verbally Normal                       Personal Care Assistance Level of Assistance  Bathing, Dressing Bathing Assistance: Limited assistance   Dressing Assistance: Limited assistance     Functional Limitations Info             SPECIAL CARE FACTORS FREQUENCY  PT (By licensed PT)                    Contractures Contractures Info: Not present    Additional Factors Info  Code Status, Allergies Code Status Info: DNR Allergies Info: nka           Current Medications (09/11/2016):  This is the  current hospital active medication list Current Facility-Administered Medications  Medication Dose Route Frequency Provider Last Rate Last Dose  . acetaminophen (TYLENOL) tablet 650 mg  650 mg Oral Q6H PRN Demetrios Loll, MD      . ALPRAZolam Duanne Moron) tablet 0.5 mg  0.5 mg Oral QHS PRN [name], DO   0.5 mg at 09/07/16 2208  . bisacodyl (DULCOLAX) EC tablet 5 mg  5 mg Oral Daily PRN [name], DO      . budesonide (PULMICORT) nebulizer solution 0.25 mg  0.25 mg Nebulization BID Loletha Grayer, MD      . calcium-vitamin D (OSCAL WITH D) 500-200 MG-UNIT per tablet 1 tablet  1 tablet Oral BID [name], DO   1 tablet at 09/11/16 1112  . cholecalciferol (VITAMIN D) tablet 1,000 Units  1,000 Units Oral Daily [name], DO   1,000 Units at 09/11/16 1112  . feeding supplement (ENSURE ENLIVE) (ENSURE ENLIVE) liquid 237 mL  237 mL Oral BID BM Demetrios Loll, MD   237 mL at 09/11/16 1113  . ferrous sulfate tablet 325 mg  325 mg Oral Q breakfast [name], DO   325 mg at 09/11/16 0818  . furosemide (LASIX) injection 20 mg  20 mg Intravenous Once Loletha Grayer, MD      . ipratropium-albuterol (DUONEB) 0.5-2.5 (3) MG/3ML nebulizer solution  3 mL  3 mL Nebulization TID Loletha Grayer, MD   3 mL at 09/11/16 0717  . metoprolol (LOPRESSOR) tablet 50 mg  50 mg Oral BID Dionisio David, MD   50 mg at 09/11/16 1112  . multivitamin with minerals tablet 1 tablet  1 tablet Oral Daily [name], DO   1 tablet at 09/11/16 1112  . ondansetron (ZOFRAN) tablet 4 mg  4 mg Oral Q6H PRN [name], DO       Or  . ondansetron (ZOFRAN) injection 4 mg  4 mg Intravenous Q6H PRN [name], DO   4 mg at 09/08/16 1321  . oxyCODONE-acetaminophen (PERCOCET/ROXICET) 5-325 MG per tablet 1 tablet  1 tablet Oral BID PRN [name], DO   1 tablet at 09/09/16 2315  . pantoprazole (PROTONIX) EC tablet 40 mg  40 mg Oral QAC breakfast [name], DO   40 mg at 09/11/16 0818  .  pramipexole (MIRAPEX) tablet 0.5 mg  0.5 mg Oral QHS [name], DO   0.5 mg at 09/10/16 2149  . senna-docusate (Senokot-S) tablet 1 tablet  1 tablet Oral QHS PRN [name], DO      . sodium chloride flush (NS) 0.9 % injection 3 mL  3 mL Intravenous Q12H [name], DO   3 mL at 09/11/16 1113  . venlafaxine XR (EFFEXOR-XR) 24 hr capsule 37.5 mg  37.5 mg Oral Q breakfast [name], DO   37.5 mg at 09/11/16 0818  . vitamin B-12 (CYANOCOBALAMIN) tablet 5,000 mcg  5,000 mcg Oral Daily [name], DO   5,000 mcg at 09/11/16 1113  . vitamin C (ASCORBIC ACID) tablet 1,000 mg  1,000 mg Oral Daily [name], DO   1,000 mg at 09/11/16 1112  . vitamin E capsule 400 Units  400 Units Oral Daily [name], DO   400 Units at 09/11/16 1112     Discharge Medications: Please see discharge summary for a list of discharge medications.  Relevant Imaging Results:  Relevant Lab Results:   Additional Information SS: [id number]  [name], LCSW

## 2016-09-11 NOTE — Clinical Social Work Placement (Signed)
   CLINICAL SOCIAL WORK PLACEMENT  NOTE  Date:  09/11/2016  Patient Details  Name: Sara Mccoy MRN: [id number] Date of Birth: 08/10/1926  Clinical Social Work is seeking post-discharge placement for this patient at the [place] level of care (*CSW will initial, date and re-position this form in  chart as items are completed):  Yes   Patient/family provided with [place] Work Department's list of facilities offering this level of care within the geographic area requested by the patient (or if unable, by the patient's family).  Yes   Patient/family informed of their freedom to choose among providers that offer the needed level of care, that participate in Medicare, Medicaid or managed care program needed by the patient, have an available bed and are willing to accept the patient.  Yes   Patient/family informed of [facility]'s ownership interest in [facility] and [facility] as well as of the fact that they are under no obligation to receive care at these facilities.  PASRR submitted to EDS on       PASRR number received on       Existing PASRR number confirmed on 09/11/16     FL2 transmitted to all facilities in geographic area requested by pt/family on 09/11/16     FL2 transmitted to all facilities within larger geographic area on       Patient informed that his/her managed care company has contracts with or will negotiate with certain facilities, including the following:        Yes   Patient/family informed of bed offers received.  Patient chooses bed at  [facility])     Physician recommends and patient chooses bed at  [facility])    Patient to be transferred to  [facility]) on 09/11/16.  Patient to be transferred to facility by EMS     Patient family notified on 09/11/16 of transfer.  Name of family member notified:  Niece and daughter      PHYSICIAN       Additional Comment:     _______________________________________________ [name], LCSW 09/11/2016, 2:42 PM

## 2016-09-11 NOTE — Clinical Social Work Note (Signed)
Clinical Social Work Assessment  Patient Details  Name: Keyarah Mcroy MRN: [id number] Date of Birth: Mar 02, 1926  Date of referral:  09/11/16               Reason for consult:  Facility Placement                Permission sought to share information with:  Facility Sport and exercise psychologist, Family Supports Permission granted to share information::  Yes, Verbal Permission Granted  Name::        Agency::     Relationship::     Contact Information:     Housing/Transportation Living arrangements for the past 2 months:  [place] of Information:  Patient Patient Interpreter Needed:  None Criminal Activity/Legal Involvement Pertinent to Current Situation/Hospitalization:  No - Comment as needed Significant Relationships:  Adult Children Lives with:  Self Do you feel safe going back to the place where you live?  Yes Need for family participation in patient care:  Yes (Comment)  Care giving concerns:  Patient is alone during the day and PT is recommending STR.   Social Worker assessment / plan:  CSW familiar with patient as patient was sent to [facility] a couple of months ago for [employer]. CSW attempted to speak with patient's daughter this afternoon but patient's daughter answered and said she could not talk and would have to call me back because she is at work. CSW spoke with patient who is alert and oriented X4 today. Patient is in agreement with rehab. She stated she loved [facility] and would want to go back there. CSW was informed by [facility] that patient would be in her copay days of her insurance and they were requesting $1050 up front, which is a week's worth. I spoke with patient about this and stated she could do that payment if she had to. CSW is checking with [facility] to see if they would be willing to consider a payment plan and CSW has left a message for patient's insurance as she would have to be prior  authed.  Employment status:  Retired Nurse, adult PT Recommendations:  [place] / Referral to community resources:  [place]  Patient/Family's Response to care:  Patient expressed appreciation for CSW assistance.  Patient/Family's Understanding of and Emotional Response to Diagnosis, Current Treatment, and Prognosis:  Patient was not happy about the thought of paying the money up front to [facility] but stated she would do it if she had to.  Emotional Assessment Appearance:  Appears stated age Attitude/Demeanor/Rapport:   (pleasant and cooperative) Affect (typically observed):  Accepting, Adaptable, Calm Orientation:  Oriented to Self, Oriented to Place, Oriented to  Time, Oriented to Situation Alcohol / Substance use:  Not Applicable Psych involvement (Current and /or in the community):  No (Comment)  Discharge Needs  Concerns to be addressed:  Care Coordination Readmission within the last 30 days:  No Current discharge risk:  None Barriers to Discharge:  No Barriers Identified   [name], LCSW 09/11/2016, 12:47 PM

## 2016-09-11 NOTE — Progress Notes (Signed)
Pt d/c to [facility] today.  IV removed intact.  Report called to [facility].  Pt transported by EMS.

## 2016-09-11 NOTE — Care Management Important Message (Signed)
Important Message  Patient Details  Name: Sara Mccoy MRN: [id number] Date of Birth: 11/22/1926   Medicare Important Message Given:  Yes    Beverly Sessions, RN 09/11/2016, 2:02 PM

## 2016-09-11 NOTE — Discharge Summary (Signed)
Discharge summary [facility] NAME: Sara Mccoy    MR#:  [id number]  DATE OF BIRTH:  Mar 03, 1926  DATE OF ADMISSION:  09/05/2016 ADMITTING PHYSICIAN: [name], DO  DATE OF DISCHARGE: 09/11/2016  PRIMARY CARE PHYSICIAN: Tracie Harrier, MD    ADMISSION DIAGNOSIS:  Rectal bleeding [K62.5]  DISCHARGE DIAGNOSIS:  Active Problems:   Symptomatic anemia   Protein-calorie malnutrition, severe   SECONDARY DIAGNOSIS:   Past Medical History:  Diagnosis Date  . Anemia   . COPD (chronic obstructive pulmonary disease) ([place])   . Lung cancer, upper lobe ([place]) 1997   right upper lobe  . Prolapse of intestine   . Raynaud's disease     HOSPITAL COURSE:   1.  Symptomatic anemia. Patient had lower GI bleed from rectal prolapse irritation. This is anemia of chronic disease with macrocytosis. This could be underlying myelodysplasia area patient refused a bone marrow as outpatient with Dr. Mike Gip. The patient was transfused 3 units of packed red blood cells during the hospital course. Last hemoglobin above 10. 2. Rectal prolapse surgery. Patient doing well postoperatively. Please see operative report by Dr. Tamala Julian. 3. Fluid overload with surgery. I needed to give a couple doses of IV Lasix 2 days in a row. Continue oral Lasix 20 mg daily upon discharge. 4. Paroxysmal atrial fibrillation. Sometimes it's rapid. By mouth Cardizem and metoprolol. Seen by Dr. Humphrey Rolls cardiology during hospital course. 5. Anxiety on Xanax and Effexor. 6. Chronic respiratory failure and COPD. Continue Advair and DuoNeb and oxygen supplementation at home. Patient wears 2 L of oxygen. 7. Hypomagnesemia. Replaced during the hospital course 8. Hypokalemia replaced during the hospital course 9. GERD on Prilosec 10. Weakness. Physical therapy recommended rehabilitation.  DISCHARGE CONDITIONS:   Satisfactory  CONSULTS OBTAINED:  Treatment Team:  Dionisio David, MD  DRUG ALLERGIES:  No Known Allergies  DISCHARGE MEDICATIONS:   Current Discharge Medication List    START taking these medications   Details  feeding supplement, ENSURE ENLIVE, (ENSURE ENLIVE) LIQD Take 237 mLs by mouth 2 (two) times daily between meals. Qty: 60 Bottle, Refills: 0    furosemide (LASIX) 20 MG tablet Take 1 tablet (20 mg total) by mouth daily. Qty: 30 tablet, Refills: 0      CONTINUE these medications which have CHANGED   Details  ALPRAZolam (XANAX) 0.5 MG tablet Take 1 tablet (0.5 mg total) by mouth at bedtime as needed for anxiety. Qty: 10 tablet, Refills: 0    oxyCODONE-acetaminophen (PERCOCET/ROXICET) 5-325 MG tablet Take 1 tablet by mouth 2 (two) times daily as needed for severe pain. Qty: 10 tablet, Refills: 0      CONTINUE these medications which have NOT CHANGED   Details  Ascorbic Acid (VITAMIN C) 1000 MG tablet Take 1,000 mg by mouth daily.    b complex vitamins capsule Take 1 capsule by mouth daily. Reported on 06/08/2016    Calcium Carb-Cholecalciferol (CALCIUM + D3) 600-200 MG-UNIT TABS Take 1 tablet by mouth 2 (two) times daily. Reported on 06/08/2016    cholecalciferol (VITAMIN D) 1000 units tablet Take 1,000 Units by mouth daily.    Cyanocobalamin (VITAMIN B-12) 5000 MCG SUBL Place 1 tablet (5,000 mcg total) under the tongue daily. Daily Qty: 30 tablet, Refills: 0    ferrous sulfate 325 (65 FE) MG tablet Take 1 tablet (325 mg total) by mouth daily with breakfast. Qty: 30 tablet, Refills: 0    Fluticasone-Salmeterol (ADVAIR) 500-50 MCG/DOSE AEPB Inhale 1  puff into the lungs 2 (two) times daily.    ipratropium-albuterol (DUONEB) 0.5-2.5 (3) MG/3ML SOLN Take 3 mLs by nebulization every 6 (six) hours as needed. Reported on 06/08/2016    metoprolol (LOPRESSOR) 50 MG tablet Take 50 mg by mouth 2 (two) times daily.    Multiple Vitamin (MULTIVITAMIN WITH MINERALS) TABS tablet Take 1 tablet by mouth daily. Reported on 06/08/2016    omeprazole  (PRILOSEC) 40 MG capsule Take 40 mg by mouth daily.    pramipexole (MIRAPEX) 0.5 MG tablet Take 0.5 mg by mouth at bedtime.     venlafaxine XR (EFFEXOR-XR) 37.5 MG 24 hr capsule Take 37.5 mg by mouth daily with breakfast.    vitamin E 400 UNIT capsule Take 400 Units by mouth daily.      STOP taking these medications     amLODipine (NORVASC) 5 MG tablet          DISCHARGE INSTRUCTIONS:   Follow-up with Dr. rehabilitation one day  If you experience worsening of your admission symptoms, develop shortness of breath, life threatening emergency, suicidal or homicidal thoughts you must seek medical attention immediately by calling 911 or calling your MD immediately  if symptoms less severe.  You Must read complete instructions/literature along with all the possible adverse reactions/side effects for all the Medicines you take and that have been prescribed to you. Take any new Medicines after you have completely understood and accept all the possible adverse reactions/side effects.   Please note  You were cared for by a hospitalist during your hospital stay. If you have any questions about your discharge medications or the care you received while you were in the hospital after you are discharged, you can call the unit and asked to speak with the hospitalist on call if the hospitalist that took care of you is not available. Once you are discharged, your primary care physician will handle any further medical issues. Please note that NO REFILLS for any discharge medications will be authorized once you are discharged, as it is imperative that you return to your primary care physician (or establish a relationship with a primary care physician if you do not have one) for your aftercare needs so that they can reassess your need for medications and monitor your lab values.    Today   CHIEF COMPLAINT:   Chief Complaint  Patient presents with  . Rectal Bleeding    HISTORY OF PRESENT ILLNESS:   Sara Mccoy  is a 82 y.o. female with a known history of Anemia rectal breathing and rectal prolapse.   VITAL SIGNS:  Blood pressure (!) 114/45, pulse (!) 105, temperature 98 F (36.7 C), resp. rate 18, height '5\' 1"'$  (1.549 m), weight 59.9 kg (132 lb), SpO2 98 %.    PHYSICAL EXAMINATION:  GENERAL:  82 y.o.-year-old patient lying in the bed, She breathes with pursed lips. EYES: Pupils equal, round, reactive to light and accommodation. No scleral icterus. Extraocular muscles intact.  HEENT: Head atraumatic, normocephalic. Oropharynx and nasopharynx clear.  NECK:  Supple, no jugular venous distention. No thyroid enlargement, no tenderness.  LUNGS: Decreased breath sounds bilaterally, no wheezing, rales,rhonchi or crepitation. No use of accessory muscles of respiration.  CARDIOVASCULAR: S1, S2 irregularly irregular. 2/6 systolic murmurs, no rubs, or gallops.  ABDOMEN: Soft, non-tender, non-distended. Bowel sounds present. No organomegaly or mass.  EXTREMITIES: Trace edema, cyanosis, or clubbing.  NEUROLOGIC: Cranial nerves II through XII are intact. Muscle strength 5/5 in all extremities. Sensation intact. Gait not checked.  PSYCHIATRIC: The patient is alert and oriented x 3.  SKIN: No obvious rash, lesion, or ulcer.   DATA REVIEW:   CBC  Recent Labs Lab 09/10/16 0536  WBC 12.0*  HGB 10.5*  HCT 31.3*  PLT 119*    Chemistries   Recent Labs Lab 09/05/16 2047  09/07/16 0526  09/11/16 0453  NA 140  < > 142  < > 138  K 4.1  < > 4.3  < > 3.9  CL 103  < > 102  < > 93*  CO2 31  < > 35*  < > 40*  GLUCOSE 95  < > 96  < > 104*  BUN 24*  < > 25*  < > 41*  CREATININE 0.99  < > 0.88  < > 1.02*  CALCIUM 8.8*  < > 8.6*  < > 8.6*  MG 1.9  < > 2.0  --   --   AST 21  --   --   --   --   ALT 16  --   --   --   --   ALKPHOS 62  --   --   --   --   BILITOT 0.7  --   --   --   --   < > = values in this interval not displayed.   Microbiology Results  Results for orders placed or  performed during the hospital encounter of 09/05/16  MRSA PCR Screening     Status: None   Collection Time: 09/09/16  9:03 AM  Result Value Ref Range Status   MRSA by PCR NEGATIVE NEGATIVE Final    Comment:        The GeneXpert MRSA Assay (FDA approved for NASAL specimens only), is one component of a comprehensive MRSA colonization surveillance program. It is not intended to diagnose MRSA infection nor to guide or monitor treatment for MRSA infections.     Management plans discussed with the patient, And she is in agreement.  CODE STATUS:     Code Status Orders        Start     Ordered   09/07/16 1649  Do not attempt resuscitation (DNR)  Continuous    Question Answer Comment  In the event of cardiac or respiratory ARREST Do not call a "code blue"   In the event of cardiac or respiratory ARREST Do not perform Intubation, CPR, defibrillation or ACLS   In the event of cardiac or respiratory ARREST Use medication by any route, position, wound care, and other measures to relive pain and suffering. May use oxygen, suction and manual treatment of airway obstruction as needed for comfort.      09/07/16 1648    Code Status History    Date Active Date Inactive Code Status Order ID Comments User Context   09/06/2016  2:05 AM 09/06/2016  1:01 PM DNR [id number]  [name], DO Inpatient   09/06/2016  1:40 AM 09/06/2016  2:05 AM Full Code [id number]  [name], DO Inpatient   09/06/2016  1:40 AM 09/06/2016  1:40 AM DNR [id number]  [name], DO Inpatient   07/25/2016  8:20 AM 07/27/2016  5:29 PM Full Code [id number]  [name], MD Inpatient   07/22/2016 10:49 AM 07/24/2016  7:04 PM DNR [id number]  [name], MD Inpatient   07/21/2016 10:33 PM 07/22/2016 10:48 AM Full Code [id number]  [name], DO ED   07/21/2016 10:33 PM 07/21/2016 10:33 PM DNR [id number]  [name], DO  ED   04/25/2016  3:51 AM 04/26/2016  7:38 PM DNR [id number]  [name], MD Inpatient    04/25/2016  3:29 AM 04/25/2016  3:29 AM Full Code [id number]  [name], MD ED    Advance Directive Documentation   Flowsheet Row Most Recent Value  Type of Advance Directive  Healthcare Power of Attorney, Living will  Pre-existing out of facility DNR order (yellow form or pink MOST form)  No data  "MOST" Form in Place?  No data      TOTAL TIME TAKING CARE OF THIS PATIENT: 35 minutes.    Loletha Grayer M.D on 09/11/2016 at 1:44 PM  Between 7am to 6pm - Pager - [phone]  After 6pm go to [email or web address] - password [email or web address]  [facility] Office  [phone]  CC: Primary care physician; Tracie Harrier, MD

## 2016-09-11 NOTE — Clinical Social Work Note (Signed)
Patient, patient's daughter and [employer]' business office have worked out a Agricultural consultant. Patient has received auth from [facility]: [id number] and this has been provided to [employer]. Physician to discharge patient today and discharge information has been sent. Nurse to call report then EMS. [place] has been notified that EMS is backed up this afternoon and there could be a long wait. [name] MSW,LcSW [phone]

## 2016-09-17 ENCOUNTER — Telehealth: Payer: Self-pay | Admitting: *Deleted

## 2016-09-17 NOTE — Telephone Encounter (Signed)
Called daughter to check on pt. She is a resident of [facility] and daughter not sure when she is going to get discharged. I asked daughter if they are checking her hgb and she does not believe so.  She should know tom whether pt d/c this weekend or stay another week. It is up in the air now.  I told her that I will check with her tom. To see if we know her status. We can schedule appt to get labs done and see md here next week and if she is in SNF we can request that they draw blood. Daughter would appreciate call tom.

## 2016-09-21 ENCOUNTER — Telehealth: Payer: Self-pay | Admitting: *Deleted

## 2016-09-21 NOTE — Telephone Encounter (Signed)
Daughter [name] texted staff here to [facility] y that her Mom is still in [facility] and that I Had mentioned that they could check her labs over there.  I called [facility] and spoke to [place] taking care of pt and says they can check her labs tom. I will need to fax order over and send it to  [phone]. Order done and faxed

## 2016-10-01 ENCOUNTER — Inpatient Hospital Stay: Payer: PPO | Admitting: Hematology and Oncology

## 2016-10-01 ENCOUNTER — Inpatient Hospital Stay: Payer: PPO

## 2016-10-01 ENCOUNTER — Other Ambulatory Visit: Payer: Self-pay | Admitting: *Deleted

## 2016-10-01 ENCOUNTER — Encounter: Payer: Self-pay | Admitting: *Deleted

## 2016-10-01 NOTE — Patient Outreach (Signed)
[facility]) Care Management  10/01/2016  Sara Mccoy [facility] 04-Aug-1926 [id number]   Phone call to patient to assess for nutritional needs.  Patient referred by [facility] to assess patient for Meals on Wheels.  Per patient, she is not interested in a referral for Meals on Wheels. "I have plenty of food in my freezer".  Per patient, she can make her own meals or she will have a frozen dinner.  Patient verbalized having no issues or concerns with her appetite. Patient describes a positive support network, stating that her daughter lives approximately 5 minutes away and visits daily on her way home for work.  Patient's daughter does the grocery shopping and writes out patient's checks to pay bills.  Patient's daughter is her Power of [place].  RNCM Rose Pierzchala notified of the above information.  She states that she is scheduled for a home visit next week.  Patient verbalized no additional social work needs at this time.  Patient urged to contact this social worker if any community resource needs arise in the future. Contact information provided.   Sara Mccoy [facility] Care Management [phone]

## 2016-10-01 NOTE — Patient Outreach (Signed)
Transition of care call successful, follow up on [facility] referral received 10/25.  Spoke with pt, HIPAA verified, discussed purpose of call - follow up on recent SNF discharge, recent hospitalization 9/30- 10/6 for rectal bleeding, anemia, malnutrition, repair of rectal prolapse, [facility] referral.  Pt gave verbal  consent to follow for transition of care, written consent in chart from another encounter.   Pt reports doing good, discharged from [facility] 10/13, completed her course.    Pt reports [place] RN came today, went over discharge papers plus daughter also went over them with her.   Pt reports [facility] PT came 10/23.   Pt reports appetite is good, on a Low Na+ diet, last weight obtained at  MD office visit was 122.5 lbs. Pt reports she did does not have MD follow up appointments scheduled yet, plan to call tomorrow.   Pt reports she is taking her medications per discharge papers, manages her own medications but does not have time to over them now with RN CM.   RN CM discussed with pt [facility] transition of care program, follow for 31 days (weekly phone calls, a home visit) to which pt requested daughter be present during home visit.     Plan: As discussed with pt, plan to  follow up again next week with home visit.            Dr. Ginette Pitman informed of [facility] involvement- barrier letter sent.     Zara Chess.   [facility] Care Management  [phone]

## 2016-10-02 ENCOUNTER — Inpatient Hospital Stay: Payer: PPO

## 2016-10-02 ENCOUNTER — Inpatient Hospital Stay: Payer: PPO | Admitting: Hematology and Oncology

## 2016-10-08 ENCOUNTER — Inpatient Hospital Stay: Payer: PPO | Admitting: Hematology and Oncology

## 2016-10-08 ENCOUNTER — Inpatient Hospital Stay: Payer: PPO

## 2016-10-09 ENCOUNTER — Other Ambulatory Visit: Payer: Self-pay | Admitting: *Deleted

## 2016-10-09 ENCOUNTER — Inpatient Hospital Stay
Admission: EM | Admit: 2016-10-09 | Discharge: 2016-10-13 | DRG: 308 | Disposition: A | Discharge status: Skilled Nursing Facility | Payer: PPO | Attending: Internal Medicine | Admitting: Internal Medicine

## 2016-10-09 ENCOUNTER — Encounter: Payer: Self-pay | Admitting: *Deleted

## 2016-10-09 ENCOUNTER — Emergency Department: Payer: PPO

## 2016-10-09 DIAGNOSIS — J189 Pneumonia, unspecified organism: Secondary | ICD-10-CM | POA: Diagnosis present

## 2016-10-09 DIAGNOSIS — D72829 Elevated white blood cell count, unspecified: Secondary | ICD-10-CM | POA: Diagnosis present

## 2016-10-09 DIAGNOSIS — Z85118 Personal history of other malignant neoplasm of bronchus and lung: Secondary | ICD-10-CM

## 2016-10-09 DIAGNOSIS — Z8249 Family history of ischemic heart disease and other diseases of the circulatory system: Secondary | ICD-10-CM

## 2016-10-09 DIAGNOSIS — E43 Unspecified severe protein-calorie malnutrition: Secondary | ICD-10-CM | POA: Diagnosis present

## 2016-10-09 DIAGNOSIS — Z79899 Other long term (current) drug therapy: Secondary | ICD-10-CM

## 2016-10-09 DIAGNOSIS — I272 Pulmonary hypertension, unspecified: Secondary | ICD-10-CM | POA: Diagnosis present

## 2016-10-09 DIAGNOSIS — R0902 Hypoxemia: Secondary | ICD-10-CM

## 2016-10-09 DIAGNOSIS — R002 Palpitations: Secondary | ICD-10-CM | POA: Diagnosis present

## 2016-10-09 DIAGNOSIS — J440 Chronic obstructive pulmonary disease with acute lower respiratory infection: Secondary | ICD-10-CM | POA: Diagnosis present

## 2016-10-09 DIAGNOSIS — I4891 Unspecified atrial fibrillation: Secondary | ICD-10-CM | POA: Diagnosis present

## 2016-10-09 DIAGNOSIS — I482 Chronic atrial fibrillation: Principal | ICD-10-CM | POA: Diagnosis present

## 2016-10-09 DIAGNOSIS — Z9660 Presence of unspecified orthopedic joint implant: Secondary | ICD-10-CM | POA: Diagnosis present

## 2016-10-09 DIAGNOSIS — D649 Anemia, unspecified: Secondary | ICD-10-CM | POA: Diagnosis present

## 2016-10-09 DIAGNOSIS — Z6827 Body mass index (BMI) 27.0-27.9, adult: Secondary | ICD-10-CM

## 2016-10-09 DIAGNOSIS — Z9889 Other specified postprocedural states: Secondary | ICD-10-CM

## 2016-10-09 DIAGNOSIS — I248 Other forms of acute ischemic heart disease: Secondary | ICD-10-CM | POA: Diagnosis present

## 2016-10-09 DIAGNOSIS — R0989 Other specified symptoms and signs involving the circulatory and respiratory systems: Secondary | ICD-10-CM

## 2016-10-09 DIAGNOSIS — J449 Chronic obstructive pulmonary disease, unspecified: Secondary | ICD-10-CM | POA: Diagnosis present

## 2016-10-09 DIAGNOSIS — Z7951 Long term (current) use of inhaled steroids: Secondary | ICD-10-CM

## 2016-10-09 DIAGNOSIS — Z87891 Personal history of nicotine dependence: Secondary | ICD-10-CM

## 2016-10-09 DIAGNOSIS — K219 Gastro-esophageal reflux disease without esophagitis: Secondary | ICD-10-CM | POA: Diagnosis present

## 2016-10-09 DIAGNOSIS — R262 Difficulty in walking, not elsewhere classified: Secondary | ICD-10-CM

## 2016-10-09 DIAGNOSIS — Z902 Acquired absence of lung [part of]: Secondary | ICD-10-CM

## 2016-10-09 DIAGNOSIS — Z9049 Acquired absence of other specified parts of digestive tract: Secondary | ICD-10-CM

## 2016-10-09 DIAGNOSIS — F419 Anxiety disorder, unspecified: Secondary | ICD-10-CM | POA: Diagnosis present

## 2016-10-09 DIAGNOSIS — I5023 Acute on chronic systolic (congestive) heart failure: Secondary | ICD-10-CM | POA: Diagnosis present

## 2016-10-09 DIAGNOSIS — Z66 Do not resuscitate: Secondary | ICD-10-CM | POA: Diagnosis present

## 2016-10-09 DIAGNOSIS — D62 Acute posthemorrhagic anemia: Secondary | ICD-10-CM | POA: Diagnosis present

## 2016-10-09 DIAGNOSIS — M6281 Muscle weakness (generalized): Secondary | ICD-10-CM

## 2016-10-09 DIAGNOSIS — R0600 Dyspnea, unspecified: Secondary | ICD-10-CM

## 2016-10-09 DIAGNOSIS — I7300 Raynaud's syndrome without gangrene: Secondary | ICD-10-CM | POA: Diagnosis present

## 2016-10-09 DIAGNOSIS — Z833 Family history of diabetes mellitus: Secondary | ICD-10-CM

## 2016-10-09 HISTORY — DX: Anxiety disorder, unspecified: F41.9

## 2016-10-09 HISTORY — DX: Gastro-esophageal reflux disease without esophagitis: K21.9

## 2016-10-09 LAB — CBC
HEMATOCRIT: 25.9 % — AB (ref 35.0–47.0)
Hemoglobin: 8.5 g/dL — ABNORMAL LOW (ref 12.0–16.0)
MCH: 34.8 pg — ABNORMAL HIGH (ref 26.0–34.0)
MCHC: 32.7 g/dL (ref 32.0–36.0)
MCV: 106.4 fL — AB (ref 80.0–100.0)
PLATELETS: 129 10*3/uL — AB (ref 150–440)
RBC: 2.44 MIL/uL — ABNORMAL LOW (ref 3.80–5.20)
RDW: 21 % — AB (ref 11.5–14.5)
WBC: 16.1 10*3/uL — ABNORMAL HIGH (ref 3.6–11.0)

## 2016-10-09 LAB — BASIC METABOLIC PANEL
Anion gap: 6 (ref 5–15)
BUN: 35 mg/dL — AB (ref 6–20)
CHLORIDE: 98 mmol/L — AB (ref 101–111)
CO2: 35 mmol/L — ABNORMAL HIGH (ref 22–32)
CREATININE: 0.84 mg/dL (ref 0.44–1.00)
Calcium: 9.2 mg/dL (ref 8.9–10.3)
GFR calc Af Amer: >60 mL/min (ref >60)
GLUCOSE: 83 mg/dL (ref 65–99)
POTASSIUM: 4.7 mmol/L (ref 3.5–5.1)
Sodium: 139 mmol/L (ref 135–145)

## 2016-10-09 LAB — TROPONIN I: Troponin I: 0.04 ng/mL (ref <0.03)

## 2016-10-09 MED ORDER — DILTIAZEM HCL 25 MG/5ML IV SOLN
INTRAVENOUS | Status: AC
  Administered 2016-10-09: 10 mg via INTRAVENOUS
  Filled 2016-10-09: qty 5

## 2016-10-09 MED ORDER — PRAMIPEXOLE DIHYDROCHLORIDE 0.25 MG PO TABS
0.5000 mg | ORAL_TABLET | Freq: Every day | ORAL | Status: DC
  Administered 2016-10-09 – 2016-10-12 (×4): 0.5 mg via ORAL
  Filled 2016-10-09 (×4): qty 2

## 2016-10-09 MED ORDER — ONDANSETRON HCL 4 MG PO TABS: 4.0000 mg | ORAL_TABLET | Freq: Four times a day (QID) | ORAL | Status: DC | PRN

## 2016-10-09 MED ORDER — MOMETASONE FURO-FORMOTEROL FUM 200-5 MCG/ACT IN AERO
2.0000 | INHALATION_SPRAY | Freq: Two times a day (BID) | RESPIRATORY_TRACT | Status: DC
  Administered 2016-10-10 – 2016-10-13 (×6): 2 via RESPIRATORY_TRACT
  Filled 2016-10-09: qty 8.8

## 2016-10-09 MED ORDER — ORAL CARE MOUTH RINSE
15.0000 mL | Freq: Two times a day (BID) | OROMUCOSAL | Status: DC
  Administered 2016-10-10 – 2016-10-12 (×4): 15 mL via OROMUCOSAL

## 2016-10-09 MED ORDER — ALPRAZOLAM 0.5 MG PO TABS
0.5000 mg | ORAL_TABLET | Freq: Every day | ORAL | Status: DC
Start: 2016-10-09 — End: 2016-10-13
  Administered 2016-10-09 – 2016-10-12 (×4): 0.5 mg via ORAL
  Filled 2016-10-09 (×4): qty 1

## 2016-10-09 MED ORDER — SODIUM CHLORIDE 0.9% FLUSH
3.0000 mL | Freq: Two times a day (BID) | INTRAVENOUS | Status: DC
  Administered 2016-10-09 – 2016-10-13 (×8): 3 mL via INTRAVENOUS

## 2016-10-09 MED ORDER — DILTIAZEM HCL 25 MG/5ML IV SOLN
10.0000 mg | Freq: Once | INTRAVENOUS | Status: AC
Start: 2016-10-09 — End: 2016-10-09
  Administered 2016-10-09: 10 mg via INTRAVENOUS

## 2016-10-09 MED ORDER — ACETAMINOPHEN 325 MG PO TABS
650.0000 mg | ORAL_TABLET | Freq: Four times a day (QID) | ORAL | Status: DC | PRN
  Administered 2016-10-13: 650 mg via ORAL
  Filled 2016-10-09: qty 2

## 2016-10-09 MED ORDER — DILTIAZEM HCL 60 MG PO TABS
60.0000 mg | ORAL_TABLET | Freq: Three times a day (TID) | ORAL | Status: DC
  Administered 2016-10-09 – 2016-10-13 (×11): 60 mg via ORAL
  Filled 2016-10-09 (×11): qty 1

## 2016-10-09 MED ORDER — ENOXAPARIN SODIUM 40 MG/0.4ML ~~LOC~~ SOLN
40.0000 mg | SUBCUTANEOUS | Status: DC
  Administered 2016-10-10 – 2016-10-12 (×3): 40 mg via SUBCUTANEOUS
  Filled 2016-10-09 (×3): qty 0.4

## 2016-10-09 MED ORDER — PANTOPRAZOLE SODIUM 40 MG PO TBEC
40.0000 mg | DELAYED_RELEASE_TABLET | Freq: Every day | ORAL | Status: DC
  Administered 2016-10-10 – 2016-10-13 (×4): 40 mg via ORAL
  Filled 2016-10-09 (×4): qty 1

## 2016-10-09 MED ORDER — DILTIAZEM HCL 25 MG/5ML IV SOLN
10.0000 mg | Freq: Once | INTRAVENOUS | Status: AC
  Administered 2016-10-09: 10 mg via INTRAVENOUS

## 2016-10-09 MED ORDER — ACETAMINOPHEN 650 MG RE SUPP: 650.0000 mg | Freq: Four times a day (QID) | RECTAL | Status: DC | PRN

## 2016-10-09 MED ORDER — ONDANSETRON HCL 4 MG/2ML IJ SOLN: 4.0000 mg | Freq: Four times a day (QID) | INTRAMUSCULAR | Status: DC | PRN

## 2016-10-09 NOTE — ED Notes (Signed)
Pt provided [place] sandwich tray and milk to drink. OK per Dr. Jannifer Franklin.

## 2016-10-09 NOTE — ED Notes (Signed)
Sara Mccoy [phone] Daughter Home [phone]  Son in law Pietro Cassis [phone] in case daughter can't be reached

## 2016-10-09 NOTE — ED Notes (Signed)
Pt attempted to urinate, was unable at this time. Did well getting up with assistance. Became SOB with movement.

## 2016-10-09 NOTE — H&P (Signed)
[facility] [place] NAME: Sara Mccoy    MR#:  [id number]  DATE OF BIRTH:  1926-11-05  DATE OF ADMISSION:  10/09/2016  PRIMARY CARE PHYSICIAN: Tracie Harrier, MD   REQUESTING/REFERRING PHYSICIAN: Kerman Passey, MD  CHIEF COMPLAINT:   Chief Complaint  Patient presents with  . Tachycardia    HISTORY OF PRESENT ILLNESS:  Sara Mccoy  is a 82 y.o. female who presents with Chest tightness and shortness of breath. The patient arrived in the ED she was found to be in A. fib with RVR. She was given a couple doses of diltiazem with good response, but not complete control of her heart rate. Troponin was very mildly elevated and she had a little bit of leukocytosis. Hospitals were called for admission and further evaluation.  PAST MEDICAL HISTORY:   Past Medical History:  Diagnosis Date  . Anemia   . Anxiety   . COPD (chronic obstructive pulmonary disease) ([place])   . GERD (gastroesophageal reflux disease)   . Lung cancer, upper lobe ([place]) 1997   right upper lobe  . Prolapse of intestine   . Raynaud's disease     PAST SURGICAL HISTORY:   Past Surgical History:  Procedure Laterality Date  . ABDOMINAL HYSTERECTOMY    . CESAREAN SECTION    . CHOLECYSTECTOMY    . FLEXIBLE SIGMOIDOSCOPY N/A 04/25/2016   Procedure: FLEXIBLE SIGMOIDOSCOPY;  Surgeon: Lucilla Lame, MD;  Location: [facility] ENDOSCOPY;  Service: Endoscopy;  Laterality: N/A;  . JOINT REPLACEMENT    . LUNG SURGERY Right 1997   RUL removed  . REPAIR OF RECTAL PROLAPSE N/A 09/09/2016   Procedure: REPAIR OF RECTAL PROLAPSE;  Surgeon: Leonie Green, MD;  Location: [facility] ORS;  Service: General;  Laterality: N/A;    SOCIAL HISTORY:   Social History  Substance Use Topics  . Smoking status: Former Smoker    Packs/day: 2.00    Years: 46.00  . Smokeless tobacco: Never Used  . Alcohol use No    FAMILY HISTORY:   Family History  Problem Relation Age of Onset  .  Heart attack Son   . Diabetes Son   . Diabetes Mother   . Hypertension Daughter     DRUG ALLERGIES:  No Known Allergies  MEDICATIONS AT HOME:   Prior to Admission medications   Medication Sig Start Date End Date Taking? Authorizing Provider  albuterol (PROVENTIL HFA;VENTOLIN HFA) 108 (90 Base) MCG/ACT inhaler Inhale 2 puffs into the lungs every 6 (six) hours as needed for wheezing or shortness of breath.    Yes Historical Provider, MD  ALPRAZolam Duanne Moron) 0.5 MG tablet Take 1 tablet (0.5 mg total) by mouth at bedtime as needed for anxiety. Patient taking differently: Take 0.5 mg by mouth at bedtime.  09/11/16  Yes Loletha Grayer, MD  Ascorbic Acid (VITAMIN C) 1000 MG tablet Take 1,000 mg by mouth daily.   Yes Historical Provider, MD  b complex vitamins capsule Take 1 capsule by mouth daily. Reported on 06/08/2016   Yes Historical Provider, MD  Calcium 600-200 MG-UNIT tablet Take 1 tablet by mouth daily.   Yes Historical Provider, MD  cholecalciferol (VITAMIN D) 1000 units tablet Take 1,000 Units by mouth daily.   Yes Historical Provider, MD  Cyanocobalamin (VITAMIN B-12) 5000 MCG SUBL Place 1 tablet (5,000 mcg total) under the tongue daily. Daily Patient taking differently: Place 1 tablet under the tongue daily.  07/27/16  Yes Srikar Sudini, MD  ferrous sulfate 325 (65  FE) MG tablet Take 1 tablet (325 mg total) by mouth daily with breakfast. 07/27/16  Yes Srikar Sudini, MD  Fluticasone-Salmeterol (ADVAIR) 500-50 MCG/DOSE AEPB Inhale 1 puff into the lungs 2 (two) times daily.   Yes Historical Provider, MD  ipratropium-albuterol (DUONEB) 0.5-2.5 (3) MG/3ML SOLN Take 3 mLs by nebulization every 6 (six) hours as needed. For wheezing/shortness of breath   Yes Historical Provider, MD  Multiple Vitamin (MULTIVITAMIN WITH MINERALS) TABS tablet Take 1 tablet by mouth daily. Reported on 06/08/2016   Yes Historical Provider, MD  omeprazole (PRILOSEC) 40 MG capsule Take 40 mg by mouth daily.   Yes Historical  Provider, MD  oxyCODONE-acetaminophen (PERCOCET/ROXICET) 5-325 MG tablet Take 1 tablet by mouth 2 (two) times daily as needed for severe pain. Patient taking differently: Take 1 tablet by mouth every morning.  09/11/16  Yes Richard Leslye Peer, MD  pramipexole (MIRAPEX) 0.5 MG tablet Take 0.5 mg by mouth at bedtime.    Yes Historical Provider, MD  vitamin E 400 UNIT capsule Take 400 Units by mouth daily.   Yes Historical Provider, MD  diltiazem (CARDIZEM) 120 MG tablet Take 1 tablet (120 mg total) by mouth daily. Patient not taking: Reported on 10/09/2016 09/11/16   Loletha Grayer, MD  furosemide (LASIX) 20 MG tablet Take 1 tablet (20 mg total) by mouth daily. Patient not taking: Reported on 10/09/2016 09/11/16   Loletha Grayer, MD    REVIEW OF SYSTEMS:  Review of Systems  Constitutional: Negative for chills, fever, malaise/fatigue and weight loss.  HENT: Negative for ear pain, hearing loss and tinnitus.   Eyes: Negative for blurred vision, double vision, pain and redness.  Respiratory: Positive for shortness of breath. Negative for cough and hemoptysis.   Cardiovascular: Positive for chest pain. Negative for palpitations, orthopnea and leg swelling.  Gastrointestinal: Negative for abdominal pain, constipation, diarrhea, nausea and vomiting.  Genitourinary: Negative for dysuria, frequency and hematuria.  Musculoskeletal: Negative for back pain, joint pain and neck pain.  Skin:       No acne, rash, or lesions  Neurological: Negative for dizziness, tremors, focal weakness and weakness.  Endo/Heme/Allergies: Negative for polydipsia. Does not bruise/bleed easily.  Psychiatric/Behavioral: Negative for depression. The patient is not nervous/anxious and does not have insomnia.      VITAL SIGNS:   Vitals:   10/09/16 1830 10/09/16 1900 10/09/16 1930 10/09/16 2000  BP: 120/84 120/68 116/68 129/69  Pulse:      Resp: (!) 23 (!) 25 (!) 33 (!) 23  Temp:      TempSrc:      SpO2:      Weight:       Height:       Wt Readings from Last 3 Encounters:  10/09/16 64.4 kg (142 lb)  10/09/16 64 kg (141 lb)  09/05/16 59.9 kg (132 lb)    PHYSICAL EXAMINATION:  Physical Exam  Vitals reviewed. Constitutional: She is oriented to person, place, and time. She appears well-developed and well-nourished. No distress.  HENT:  Head: Normocephalic and atraumatic.  Mouth/Throat: Oropharynx is clear and moist.  Eyes: Conjunctivae and EOM are normal. Pupils are equal, round, and reactive to light. No scleral icterus.  Neck: Normal range of motion. Neck supple. No JVD present. No thyromegaly present.  Cardiovascular: Intact distal pulses.  Exam reveals no gallop and no friction rub.   No murmur heard. Tachycardic, irregular rhythm  Respiratory: Effort normal and breath sounds normal. No respiratory distress. She has no wheezes. She has no rales.  GI: Soft. Bowel sounds are normal. She exhibits no distension. There is no tenderness.  Musculoskeletal: Normal range of motion. She exhibits no edema.  No arthritis, no gout  Lymphadenopathy:    She has no cervical adenopathy.  Neurological: She is alert and oriented to person, place, and time. No cranial nerve deficit.  No dysarthria, no aphasia  Skin: Skin is warm and dry. No rash noted. No erythema.  Psychiatric: She has a normal mood and affect. Her behavior is normal. Judgment and thought content normal.    LABORATORY PANEL:   CBC  Recent Labs Lab 10/09/16 1835  WBC 16.1*  HGB 8.5*  HCT 25.9*  PLT 129*   ------------------------------------------------------------------------------------------------------------------  Chemistries   Recent Labs Lab 10/09/16 1835  NA 139  K 4.7  CL 98*  CO2 35*  GLUCOSE 83  BUN 35*  CREATININE 0.84  CALCIUM 9.2   ------------------------------------------------------------------------------------------------------------------  Cardiac Enzymes  Recent Labs Lab 10/09/16 1835  TROPONINI  0.04*   ------------------------------------------------------------------------------------------------------------------  RADIOLOGY:  Dg Chest Portable 1 View  Result Date: 10/09/2016 CLINICAL DATA:  Weakness, atrial fibrillation with rapid ventricular response, COPD, history lung cancer RIGHT upper lobe EXAM: PORTABLE CHEST 1 VIEW COMPARISON:  Portable exam 1946 hours compared to 07/25/2016 FINDINGS: Enlargement of cardiac silhouette. Atherosclerotic calcification and tortuosity of thoracic aorta. Mediastinal contours and pulmonary vascularity normal. Emphysematous changes with RIGHT basilar scarring. No definite acute infiltrate, pleural effusion or pneumothorax. Diffuse osseous demineralization with orthopedic hardware at LEFT humeral diaphysis distally. IMPRESSION: Enlargement of cardiac silhouette. RIGHT basilar scarring without acute infiltrate. Aortic atherosclerosis. Electronically Signed   By: Lavonia Dana M.D.   On: 10/09/2016 20:01    EKG:   Orders placed or performed during the hospital encounter of 10/09/16  . EKG 12-Lead  . EKG 12-Lead  . ED EKG within 10 minutes  . ED EKG within 10 minutes    IMPRESSION AND PLAN:  Principal Problem:   Atrial fibrillation with RVR ([place]) - patient initially had a good response to IV diltiazem. We will initiate some by mouth diltiazem as well to see if we can finish controlling her heart rate. Goal is less than 110. Cardiology consult. Trend cardiac enzymes. Active Problems:   Leukocytosis - chest x-ray was clear, patient has no infectious symptoms, we will check a UA   COPD (chronic obstructive pulmonary disease) (HCC) - continue home inhalers   Anxiety - home dose anxiolytics   GERD (gastroesophageal reflux disease) - home dose PPI  All the records are reviewed and case discussed with ED provider. Management plans discussed with the patient and/or family.  DVT PROPHYLAXIS: SubQ lovenox  GI PROPHYLAXIS: PPI  ADMISSION STATUS:  Observation  CODE STATUS: DNR Code Status History    Date Active Date Inactive Code Status Order ID Comments User Context   09/07/2016  4:48 PM 09/11/2016  8:34 PM DNR [id number]  Rowe Clack, MD Inpatient   09/06/2016  2:05 AM 09/06/2016  1:01 PM DNR [id number]  [name], DO Inpatient   09/06/2016  1:40 AM 09/06/2016  2:05 AM Full Code [id number]  [name], DO Inpatient   09/06/2016  1:40 AM 09/06/2016  1:40 AM DNR [id number]  [name], DO Inpatient   07/25/2016  8:20 AM 07/27/2016  5:29 PM Full Code [id number]  [name], MD Inpatient   07/22/2016 10:49 AM 07/24/2016  7:04 PM DNR [id number]  [name], MD Inpatient   07/21/2016 10:33 PM 07/22/2016 10:48 AM Full Code [id number]  [name], DO  ED   07/21/2016 10:33 PM 07/21/2016 10:33 PM DNR [id number]  [name], DO ED   04/25/2016  3:51 AM 04/26/2016  7:38 PM DNR [id number]  [name], MD Inpatient   04/25/2016  3:29 AM 04/25/2016  3:29 AM Full Code [id number]  [name], MD ED    Questions for Most Recent Historical Code Status (Order [id number])    Question Answer Comment   In the event of cardiac or respiratory ARREST Do not call a "code blue"    In the event of cardiac or respiratory ARREST Do not perform Intubation, CPR, defibrillation or ACLS    In the event of cardiac or respiratory ARREST Use medication by any route, position, wound care, and other measures to relive pain and suffering. May use oxygen, suction and manual treatment of airway obstruction as needed for comfort.         Advance Directive Documentation   [place] Most Recent Value  Type of Advance Directive  Living will  Pre-existing out of facility DNR order (yellow form or pink MOST form)  No data  "MOST" Form in Place?  No data      TOTAL TIME TAKING CARE OF THIS PATIENT: 40 minutes.    [place] FIELDING 10/09/2016, 8:51 PM  [facility]  Office  [phone]  CC: Primary care  physician; Tracie Harrier, MD

## 2016-10-09 NOTE — Progress Notes (Signed)
Patient arrived to 2A Room 251. Patient denies pain and all questions answered. Patient oriented to unit and Fall Safety Plan signed. Skin assessment completed with [facility] and skin intact. A&Ox4, VSS, and A-fib on verified tele-box #[id number]. Call bell within reach, bed alarm on, and side rails up x2. Nursing staff will continue to monitor for any changes in patient status. Earleen Reaper, RN

## 2016-10-09 NOTE — Patient Outreach (Addendum)
[facility]) Care Management   10/09/2016  Sara Mccoy [facility] 07/09/1926 [id number]  Sara Mccoy is an 82 y.o. female  Subjective: Pt reports feeling anxious today, did not do her nebulizer as that would make her more  Anxious.  Pt reports to see Dr. Tamala Mccoy (surgeon) 11/7, Dr. Ginette Mccoy 11/16.  Pt report gets sob easy, today A bad day.   Pt reports Dr. Ginette Mccoy working on getting her portable oxygen tanks.   Pt  reports both she and daughter cannot handle the big oxygen tanks when she goes to MD visits so she goes without the oxygen.  Pt reports HH PT came today.    Objective:   Vitals:   10/09/16 1551  BP: 90/70  Pulse: (!) 55  Resp: (!) 24    ROS  Physical Exam  Constitutional: She is oriented to person, place, and time. She appears well-developed and well-nourished.  Cardiovascular:  Fluctuation in heart rate 55- 174.   Respiratory: Effort normal and breath sounds normal.  GI: Soft. Bowel sounds are normal.  Musculoskeletal: Normal range of motion. She exhibits edema.  Trace edema noted in both ankles.   Neurological: She is alert and oriented to person, place, and time.  Skin: Skin is warm and dry.  Psychiatric: She has a normal mood and affect. Her behavior is normal. Judgment and thought content normal.       Encounter Medications:  Reviewed with pt and daughter Sara Mccoy  Outpatient Encounter Prescriptions as of 10/09/2016  Medication Sig Note  . Ascorbic Acid (VITAMIN C) 1000 MG tablet Take 1,000 mg by mouth daily.   . Calcium Carb-Cholecalciferol (CALCIUM + D3) 600-200 MG-UNIT TABS Take 1 tablet by mouth 2 (two) times daily. Reported on 06/08/2016   . Cyanocobalamin (VITAMIN B-12) 5000 MCG SUBL Place 1 tablet (5,000 mcg total) under the tongue daily. Daily   . feeding supplement, ENSURE ENLIVE, (ENSURE ENLIVE) LIQD Take 237 mLs by mouth 2 (two) times daily between meals. 10/09/2016: Pt taking boost 2-3 times a day   . ferrous sulfate 325 (65  FE) MG tablet Take 1 tablet (325 mg total) by mouth daily with breakfast.   . Fluticasone-Salmeterol (ADVAIR) 500-50 MCG/DOSE AEPB Inhale 1 puff into the lungs 2 (two) times daily.   Marland Kitchen ipratropium-albuterol (DUONEB) 0.5-2.5 (3) MG/3ML SOLN Take 3 mLs by nebulization every 6 (six) hours as needed. Reported on 06/08/2016   . Multiple Vitamin (MULTIVITAMIN WITH MINERALS) TABS tablet Take 1 tablet by mouth daily. Reported on 06/08/2016   . omeprazole (PRILOSEC) 40 MG capsule Take 40 mg by mouth daily.   Marland Kitchen oxyCODONE-acetaminophen (PERCOCET/ROXICET) 5-325 MG tablet Take 1 tablet by mouth 2 (two) times daily as needed for severe pain.   . pramipexole (MIRAPEX) 0.5 MG tablet Take 0.5 mg by mouth at bedtime.    Marland Kitchen venlafaxine XR (EFFEXOR-XR) 37.5 MG 24 hr capsule Take 37.5 mg by mouth daily with breakfast.   . vitamin E 400 UNIT capsule Take 400 Units by mouth daily.   Marland Kitchen ALPRAZolam (XANAX) 0.5 MG tablet Take 1 tablet (0.5 mg total) by mouth at bedtime as needed for anxiety. (Patient not taking: Reported on 10/09/2016) 10/09/2016: Out of them,  . b complex vitamins capsule Take 1 capsule by mouth daily. Reported on 06/08/2016   . cholecalciferol (VITAMIN D) 1000 units tablet Take 1,000 Units by mouth daily.   Marland Kitchen diltiazem (CARDIZEM) 120 MG tablet Take 1 tablet (120 mg total) by mouth daily. (Patient not taking: Reported on  10/09/2016)   . furosemide (LASIX) 20 MG tablet Take 1 tablet (20 mg total) by mouth daily. (Patient not taking: Reported on 10/09/2016) 10/09/2016: Pt not taking  . metoprolol (LOPRESSOR) 50 MG tablet Take 50 mg by mouth 2 (two) times daily.    No facility-administered encounter medications on file as of 10/09/2016.     Functional Status:   In your present state of health, do you have any difficulty performing the following activities: 10/09/2016 09/06/2016  Hearing? Tempie Donning  Vision? N N  Difficulty concentrating or making decisions? Y N  Walking or climbing stairs? Y Y  Dressing or bathing? N N   Doing errands, shopping? Tempie Donning  Preparing Food and eating ? Y -  Using the Toilet? N -  In the past six months, have you accidently leaked urine? Y -  Do you have problems with loss of bowel control? Y -  Managing your Medications? - -  Managing your Finances? - -  Housekeeping or managing your Housekeeping? Y -  Some recent data might be hidden    Fall/Depression Screening:    PHQ 2/9 Scores 10/09/2016 06/08/2016  PHQ - 2 Score 0 1    Assessment:  82 year old woman, resides alone, daughter Sara Mccoy close by, manages pt's medications.                       Lungs clear, O 2 sat at rest 96% with 3 liters Sara Mccoy on.  O 2 sat checked after pt had oxygen                            Off  short period- dropped to 84%.   Fluctuation noted in pt's HR- ranging from 55-174,                            On auscultation- heart racing.   Pt called daughter, RN CM provided update.  Daughter                              And her spouse  arrived at pt's home- decision made to call EMS to take pt  to ED                                              As pt had no portable Oxygen tanks.                                                     Plan-    4:44 pm= RN CM called Dr. Linton Mccoy office, received on call service- message left to inform MD                On call pt's fluctuation in HR 55-174.                5:08 pm= RN CM called  Dr. Linton Mccoy after hours service to relay message sending pt to ED                     To  be evaluated- HR still racing.               5:19 pm = Received a call from Dr. Golden Mccoy (MD on call), relayed pt's fluctuation in HR,racing                       Highest reading 174, pt to f/u at ED to which MD agreed.                Daughter called EMS, RN CM provided report on pt's status to dispatcher.               Fire department/EMS arrived-  RN CM provided report on pt's status, vital signs.               Plan to continue to follow pt for transition of care, today's ED visit, follow up again                 Next week telephonically.               Plan to fax Dr. Ginette Mccoy today's home visit encounter.   [facility] CM Care Plan Problem One   Flowsheet Row Most Recent Value  Care Plan Problem One  Risk for readmission related to recent SNF discharge -follow hospitalization 9/30--10/6 rectal bleeding,anemia, malnutrition, repair of rectal prolapse   Role Documenting the Problem One  Care Management Coordinator  Care Plan for Problem One  Active  [facility] Long Term Goal (31-90 days)  (P) Pt would have no readmissions (SNF , hospital)within the next 31 days   [facility] Long Term Goal Start Date  (P) 10/01/16  Interventions for Problem One Long Term Goal  (P) initial home visit done, EMS called for pt to go to ED- elevated HR.        Zara Chess.   [place] Care Management  [phone]

## 2016-10-09 NOTE — ED Notes (Signed)
Called daughter [name] to inform her that pt will be admitted. Will call back later with a room number.

## 2016-10-09 NOTE — ED Triage Notes (Signed)
Pt came to ED via EMS from home c/o weakness. Pt in afib RVR w/ Hr 170-180s. Does not take any meds for afib.

## 2016-10-09 NOTE — ED Provider Notes (Addendum)
[facility] Emergency Department Provider Note  Time seen: 7:07 PM  I have reviewed the triage vital signs and the nursing notes.   HISTORY  Chief Complaint Tachycardia    HPI Sara Mccoy is a 82 y.o. female with a past medical history of COPD, atrial fibrillation, who presents the emergency department for difficulty breathing and chest tightness. According to the patientfor the past 2 days she has been feeling increased shortness of breath, and today has been feeling chest tightness with worsening shortness of breath. Denies any "pain." Patient states a history. A fibrillation diagnosed approximately 3 months ago. Denies missing any of her medications. Patient states that her breathing became significant nights she came to the emergency department via EMS for further evaluation. Upon arrival the patient is in no distress, but has a heart rate around 170 bpm.  Past Medical History:  Diagnosis Date  . Anemia   . COPD (chronic obstructive pulmonary disease) ([place])   . Lung cancer, upper lobe ([place]) 1997   right upper lobe  . Prolapse of intestine   . Raynaud's disease     Patient Active Problem List   Diagnosis Date Noted  . Protein-calorie malnutrition, severe 09/08/2016  . Rectal prolapse 09/05/2016  . Acute respiratory failure ([place]) 07/22/2016  . Symptomatic anemia 07/21/2016  . GI bleed 04/25/2016  . Abdominal mass   . Absolute anemia   . Blood in stool   . Diverticulosis of large intestine without diverticulitis     Past Surgical History:  Procedure Laterality Date  . ABDOMINAL HYSTERECTOMY    . CESAREAN SECTION    . CHOLECYSTECTOMY    . FLEXIBLE SIGMOIDOSCOPY N/A 04/25/2016   Procedure: FLEXIBLE SIGMOIDOSCOPY;  Surgeon: Lucilla Lame, MD;  Location: [facility] ENDOSCOPY;  Service: Endoscopy;  Laterality: N/A;  . JOINT REPLACEMENT    . LUNG SURGERY Right 1997   RUL removed  . REPAIR OF RECTAL PROLAPSE N/A 09/09/2016   Procedure: REPAIR OF  RECTAL PROLAPSE;  Surgeon: Leonie Green, MD;  Location: [facility] ORS;  Service: General;  Laterality: N/A;    Prior to Admission medications   Medication Sig Start Date End Date Taking? Authorizing Provider  ALPRAZolam Duanne Moron) 0.5 MG tablet Take 1 tablet (0.5 mg total) by mouth at bedtime as needed for anxiety. Patient not taking: Reported on 10/09/2016 09/11/16   Loletha Grayer, MD  Ascorbic Acid (VITAMIN C) 1000 MG tablet Take 1,000 mg by mouth daily.    Historical Provider, MD  b complex vitamins capsule Take 1 capsule by mouth daily. Reported on 06/08/2016    Historical Provider, MD  Calcium Carb-Cholecalciferol (CALCIUM + D3) 600-200 MG-UNIT TABS Take 1 tablet by mouth 2 (two) times daily. Reported on 06/08/2016    Historical Provider, MD  cholecalciferol (VITAMIN D) 1000 units tablet Take 1,000 Units by mouth daily.    Historical Provider, MD  Cyanocobalamin (VITAMIN B-12) 5000 MCG SUBL Place 1 tablet (5,000 mcg total) under the tongue daily. Daily 07/27/16   [name], MD  diltiazem (CARDIZEM) 120 MG tablet Take 1 tablet (120 mg total) by mouth daily. Patient not taking: Reported on 10/09/2016 09/11/16   Loletha Grayer, MD  feeding supplement, ENSURE ENLIVE, (ENSURE ENLIVE) LIQD Take 237 mLs by mouth 2 (two) times daily between meals. 09/11/16   Loletha Grayer, MD  ferrous sulfate 325 (65 FE) MG tablet Take 1 tablet (325 mg total) by mouth daily with breakfast. 07/27/16   [name], MD  Fluticasone-Salmeterol (ADVAIR) 500-50 MCG/DOSE AEPB  Inhale 1 puff into the lungs 2 (two) times daily.    Historical Provider, MD  furosemide (LASIX) 20 MG tablet Take 1 tablet (20 mg total) by mouth daily. Patient not taking: Reported on 10/09/2016 09/11/16   Loletha Grayer, MD  ipratropium-albuterol (DUONEB) 0.5-2.5 (3) MG/3ML SOLN Take 3 mLs by nebulization every 6 (six) hours as needed. Reported on 06/08/2016    Historical Provider, MD  metoprolol (LOPRESSOR) 50 MG tablet Take 50 mg by mouth 2 (two)  times daily.    Historical Provider, MD  Multiple Vitamin (MULTIVITAMIN WITH MINERALS) TABS tablet Take 1 tablet by mouth daily. Reported on 06/08/2016    Historical Provider, MD  omeprazole (PRILOSEC) 40 MG capsule Take 40 mg by mouth daily.    Historical Provider, MD  oxyCODONE-acetaminophen (PERCOCET/ROXICET) 5-325 MG tablet Take 1 tablet by mouth 2 (two) times daily as needed for severe pain. 09/11/16   Loletha Grayer, MD  pramipexole (MIRAPEX) 0.5 MG tablet Take 0.5 mg by mouth at bedtime.     Historical Provider, MD  venlafaxine XR (EFFEXOR-XR) 37.5 MG 24 hr capsule Take 37.5 mg by mouth daily with breakfast.    Historical Provider, MD  vitamin E 400 UNIT capsule Take 400 Units by mouth daily.    Historical Provider, MD    No Known Allergies  Family History  Problem Relation Age of Onset  . Heart attack Son   . Diabetes Son   . Diabetes Mother   . Hypertension Daughter     Social History Social History  Substance Use Topics  . Smoking status: Former Smoker    Packs/day: 2.00    Years: 46.00  . Smokeless tobacco: Never Used  . Alcohol use No    Review of Systems Constitutional: Negative for fever. Cardiovascular: Positive for chest tightness Respiratory: Positive for shortness of breath Gastrointestinal: Negative for abdominal pain Neurological: Negative for headaches, focal weakness or numbness. 10-point ROS otherwise negative.  ____________________________________________   PHYSICAL EXAM:  VITAL SIGNS: ED Triage Vitals  Enc Vitals Group     BP 10/09/16 1826 109/74     Pulse Rate 10/09/16 1826 (!) 157     Resp 10/09/16 1826 12     Temp 10/09/16 1826 98.3 F (36.8 C)     Temp Source 10/09/16 1826 Oral     SpO2 10/09/16 1826 95 %     Weight 10/09/16 1824 142 lb (64.4 kg)     Height 10/09/16 1824 '5\' 1"'$  (1.549 m)     Head Circumference --      Peak Flow --      Pain Score --      Pain Loc --      Pain Edu? --      Excl. in [place]? --     Constitutional: Alert  and oriented. Well appearing and in no distress. Eyes: Normal exam ENT   Head: Normocephalic and atraumatic   Mouth/Throat: Mucous membranes are moist. Cardiovascular: Irregular rhythm rate around 150 bpm. Respiratory: Normal respiratory effort without tachypnea nor retractions. Breath sounds are clear  Gastrointestinal: Soft and nontender. No distention.   Musculoskeletal: Nontender with normal range of motion in all extremities.  Neurologic:  Normal speech and language. No gross focal neurologic deficits  Skin:  Skin is warm, dry and intact.  Psychiatric: Mood and affect are normal.  ____________________________________________    EKG  EKG reviewed and interpreted by myself shows atrial fibrillation with rapid ventricular response at 141 bpm, slightly widened QRS, large normal axis,  nonspecific ST changes no obvious ST elevation.  ____________________________________________    RADIOLOGY  Chest x-ray consistent cardiomegaly, no acute change.  ____________________________________________   INITIAL IMPRESSION / ASSESSMENT AND PLAN / ED COURSE  Pertinent labs & imaging results that were available during my care of the patient were reviewed by me and considered in my medical decision making (see chart for details).  The patient presents to the emergency department with acute onset shortness of breath which has worsened significantly today. Patient found to be an atrial fibrillation with rapid ventricular response around 150 bpm in the emergency department. Labs show the patient's hemoglobin has decreased by approximately 2 points over the past one month. We will perform a rectal examination to check for any GI bleeding. Labs otherwise close to patient's baseline besides a moderate leukocytosis of 16,000, troponin pending.  Chest x-ray shows no acute change. Patient remains in a 2 fibrillation with rapid ventricular response. We will dose a second dose of IV  diltiazem.  Patient is rectal exam is negative. Guaiac negative. Patient had a surgery at the beginning of October for rectal prolapse, this is likely the cause of the patient's anemia. Patient continues to be in A. fib with RVR around 100 bpm. We'll admit to the hospital for further treatment.  CRITICAL CARE Performed by: Harvest Dark   Total critical care time: 30 minutes  Critical care time was exclusive of separately billable procedures and treating other patients.  Critical care was necessary to treat or prevent imminent or life-threatening deterioration.  Critical care was time spent personally by me on the following activities: development of treatment plan with patient and/or surrogate as well as nursing, discussions with consultants, evaluation of patient's response to treatment, examination of patient, obtaining history from patient or surrogate, ordering and performing treatments and interventions, ordering and review of laboratory studies, ordering and review of radiographic studies, pulse oximetry and re-evaluation of patient's condition.     ____________________________________________   FINAL CLINICAL IMPRESSION(S) / ED DIAGNOSES  Dyspnea Atrial fibrillation with rapid ventricular response    Harvest Dark, MD 10/09/16 2028    Harvest Dark, MD 10/09/16 2038

## 2016-10-09 NOTE — ED Notes (Signed)
Pt HR went from 120's to 90's, bouncing some into the low 100's. Will continue to monitor.

## 2016-10-10 ENCOUNTER — Observation Stay
Admit: 2016-10-10 | Discharge: 2016-10-10 | Disposition: A | Discharge status: Home / Self Care | Payer: PPO | Attending: Internal Medicine | Admitting: Internal Medicine

## 2016-10-10 DIAGNOSIS — Z87891 Personal history of nicotine dependence: Secondary | ICD-10-CM | POA: Diagnosis not present

## 2016-10-10 DIAGNOSIS — Z6827 Body mass index (BMI) 27.0-27.9, adult: Secondary | ICD-10-CM | POA: Diagnosis not present

## 2016-10-10 DIAGNOSIS — Z7951 Long term (current) use of inhaled steroids: Secondary | ICD-10-CM | POA: Diagnosis not present

## 2016-10-10 DIAGNOSIS — J189 Pneumonia, unspecified organism: Secondary | ICD-10-CM | POA: Diagnosis present

## 2016-10-10 DIAGNOSIS — I482 Chronic atrial fibrillation: Secondary | ICD-10-CM | POA: Diagnosis present

## 2016-10-10 DIAGNOSIS — Z9049 Acquired absence of other specified parts of digestive tract: Secondary | ICD-10-CM | POA: Diagnosis not present

## 2016-10-10 DIAGNOSIS — I4891 Unspecified atrial fibrillation: Secondary | ICD-10-CM | POA: Diagnosis present

## 2016-10-10 DIAGNOSIS — I5023 Acute on chronic systolic (congestive) heart failure: Secondary | ICD-10-CM | POA: Diagnosis present

## 2016-10-10 DIAGNOSIS — K219 Gastro-esophageal reflux disease without esophagitis: Secondary | ICD-10-CM | POA: Diagnosis present

## 2016-10-10 DIAGNOSIS — Z902 Acquired absence of lung [part of]: Secondary | ICD-10-CM | POA: Diagnosis not present

## 2016-10-10 DIAGNOSIS — R002 Palpitations: Secondary | ICD-10-CM | POA: Diagnosis present

## 2016-10-10 DIAGNOSIS — E43 Unspecified severe protein-calorie malnutrition: Secondary | ICD-10-CM | POA: Diagnosis present

## 2016-10-10 DIAGNOSIS — Z66 Do not resuscitate: Secondary | ICD-10-CM | POA: Diagnosis present

## 2016-10-10 DIAGNOSIS — I272 Pulmonary hypertension, unspecified: Secondary | ICD-10-CM | POA: Diagnosis present

## 2016-10-10 DIAGNOSIS — Z9889 Other specified postprocedural states: Secondary | ICD-10-CM | POA: Diagnosis not present

## 2016-10-10 DIAGNOSIS — Z833 Family history of diabetes mellitus: Secondary | ICD-10-CM | POA: Diagnosis not present

## 2016-10-10 DIAGNOSIS — Z85118 Personal history of other malignant neoplasm of bronchus and lung: Secondary | ICD-10-CM | POA: Diagnosis not present

## 2016-10-10 DIAGNOSIS — I248 Other forms of acute ischemic heart disease: Secondary | ICD-10-CM | POA: Diagnosis present

## 2016-10-10 DIAGNOSIS — Z8249 Family history of ischemic heart disease and other diseases of the circulatory system: Secondary | ICD-10-CM | POA: Diagnosis not present

## 2016-10-10 DIAGNOSIS — I7300 Raynaud's syndrome without gangrene: Secondary | ICD-10-CM | POA: Diagnosis present

## 2016-10-10 DIAGNOSIS — Z79899 Other long term (current) drug therapy: Secondary | ICD-10-CM | POA: Diagnosis not present

## 2016-10-10 DIAGNOSIS — J440 Chronic obstructive pulmonary disease with acute lower respiratory infection: Secondary | ICD-10-CM | POA: Diagnosis present

## 2016-10-10 DIAGNOSIS — Z9660 Presence of unspecified orthopedic joint implant: Secondary | ICD-10-CM | POA: Diagnosis present

## 2016-10-10 DIAGNOSIS — D62 Acute posthemorrhagic anemia: Secondary | ICD-10-CM | POA: Diagnosis present

## 2016-10-10 DIAGNOSIS — F419 Anxiety disorder, unspecified: Secondary | ICD-10-CM | POA: Diagnosis present

## 2016-10-10 LAB — RETICULOCYTES
RBC.: 2.09 MIL/uL — ABNORMAL LOW (ref 3.80–5.20)
RETIC COUNT ABSOLUTE: 39.7 10*3/uL (ref 19.0–183.0)
Retic Ct Pct: 1.9 % (ref 0.4–3.1)

## 2016-10-10 LAB — PREPARE RBC (CROSSMATCH)

## 2016-10-10 LAB — CBC
HCT: 20.8 % — ABNORMAL LOW (ref 35.0–47.0)
HEMOGLOBIN: 6.9 g/dL — AB (ref 12.0–16.0)
MCH: 34.5 pg — AB (ref 26.0–34.0)
MCHC: 33 g/dL (ref 32.0–36.0)
MCV: 104.6 fL — ABNORMAL HIGH (ref 80.0–100.0)
Platelets: 111 10*3/uL — ABNORMAL LOW (ref 150–440)
RBC: 1.99 MIL/uL — AB (ref 3.80–5.20)
RDW: 20.9 % — ABNORMAL HIGH (ref 11.5–14.5)
WBC: 15.6 10*3/uL — ABNORMAL HIGH (ref 3.6–11.0)

## 2016-10-10 LAB — BASIC METABOLIC PANEL
ANION GAP: 5 (ref 5–15)
BUN: 33 mg/dL — ABNORMAL HIGH (ref 6–20)
CALCIUM: 8.6 mg/dL — AB (ref 8.9–10.3)
CO2: 35 mmol/L — AB (ref 22–32)
Chloride: 99 mmol/L — ABNORMAL LOW (ref 101–111)
Creatinine, Ser: 0.77 mg/dL (ref 0.44–1.00)
GFR calc non Af Amer: >60 mL/min (ref >60)
Glucose, Bld: 137 mg/dL — ABNORMAL HIGH (ref 65–99)
Potassium: 4.8 mmol/L (ref 3.5–5.1)
Sodium: 139 mmol/L (ref 135–145)

## 2016-10-10 LAB — IRON AND TIBC
IRON: 20 ug/dL — AB (ref 28–170)
Saturation Ratios: 12 % (ref 10.4–31.8)
TIBC: 165 ug/dL — AB (ref 250–450)
UIBC: 145 ug/dL

## 2016-10-10 LAB — TSH: TSH: 3.05 u[IU]/mL (ref 0.350–4.500)

## 2016-10-10 LAB — FERRITIN: FERRITIN: 1330 ng/mL — AB (ref 11–307)

## 2016-10-10 LAB — TROPONIN I
TROPONIN I: 0.04 ng/mL — AB (ref <0.03)
TROPONIN I: 0.04 ng/mL — AB (ref <0.03)
Troponin I: 0.03 ng/mL (ref <0.03)

## 2016-10-10 LAB — HEMOGLOBIN AND HEMATOCRIT, BLOOD
HCT: 25.4 % — ABNORMAL LOW (ref 35.0–47.0)
Hemoglobin: 8.4 g/dL — ABNORMAL LOW (ref 12.0–16.0)

## 2016-10-10 LAB — VITAMIN B12: Vitamin B-12: 3028 pg/mL — ABNORMAL HIGH (ref 180–914)

## 2016-10-10 LAB — FOLATE: FOLATE: 13.1 ng/mL (ref >5.9)

## 2016-10-10 MED ORDER — SODIUM CHLORIDE 0.9 % IV SOLN
Freq: Once | INTRAVENOUS | Status: AC
  Administered 2016-10-10: 12:00:00 via INTRAVENOUS

## 2016-10-10 MED ORDER — DIGOXIN 125 MCG PO TABS
0.1250 mg | ORAL_TABLET | Freq: Every day | ORAL | Status: DC
  Administered 2016-10-10 – 2016-10-13 (×4): 0.125 mg via ORAL
  Filled 2016-10-10 (×4): qty 1

## 2016-10-10 NOTE — Consult Note (Signed)
[facility]  CARDIOLOGY CONSULT NOTE  Patient ID: Sara Mccoy MRN: [id number] DOB/AGE: 07/12/1926 82 y.o.  Admit date: 10/09/2016 Referring Physician Dr. Benjie Karvonen Primary Physician   Primary Cardiologist Dr. Saralyn Pilar Reason for Consultation afib with rvr  HPI: Sara Mccoy is an 82 year old female with history of COPD, previous atrial fibrillation, status post recent rectal surgery who was as a shallow and rapid heart rate. On presentation to the emergency room, she was noted to be in atrial fibrillation with rapid ventricular response. He was given IV Cardizem followed byby mouth Cardizem. Her hemoglobin was10.5 1 month ago, 8.5 on presentation.hemoglobin today is 6.9 with hematocrit of 20.8 and a white count of 15.6.She deniesany recent bleeding but did undergo rectal surgery recently. Her troponin was trivially elevated at 0.04 consistant with demand ischemia. She is relatively hypotensive on cadizem 60 q 8. Denies chest pain   Review of Systems  Constitutional: Positive for malaise/fatigue.  HENT: Negative.   Eyes: Negative.   Respiratory: Positive for shortness of breath.   Cardiovascular: Positive for palpitations.  Gastrointestinal: Negative.   Genitourinary: Negative.   Musculoskeletal: Negative.   Skin: Negative.   Neurological: Positive for weakness.  Endo/Heme/Allergies: Negative.   Psychiatric/Behavioral: Negative.     Past Medical History:  Diagnosis Date  . Anemia   . Anxiety   . COPD (chronic obstructive pulmonary disease) ([place])   . GERD (gastroesophageal reflux disease)   . Lung cancer, upper lobe ([place]) 1997   right upper lobe  . Prolapse of intestine   . Raynaud's disease     Family History  Problem Relation Age of Onset  . Heart attack Son   . Diabetes Son   . Diabetes Mother   . Hypertension Daughter     Social History   Social History  . Marital status: Widowed    Spouse name: N/A  . Number of  children: N/A  . Years of education: N/A   Occupational History  . Not on file.   Social History Main Topics  . Smoking status: Former Smoker    Packs/day: 2.00    Years: 46.00  . Smokeless tobacco: Never Used  . Alcohol use No  . Drug use: No  . Sexual activity: Not on file     Comment: 25 years ago   Other Topics Concern  . Not on file   Social History Narrative  . No narrative on file    Past Surgical History:  Procedure Laterality Date  . ABDOMINAL HYSTERECTOMY    . CESAREAN SECTION    . CHOLECYSTECTOMY    . FLEXIBLE SIGMOIDOSCOPY N/A 04/25/2016   Procedure: FLEXIBLE SIGMOIDOSCOPY;  Surgeon: Lucilla Lame, MD;  Location: [facility] ENDOSCOPY;  Service: Endoscopy;  Laterality: N/A;  . JOINT REPLACEMENT    . LUNG SURGERY Right 1997   RUL removed  . REPAIR OF RECTAL PROLAPSE N/A 09/09/2016   Procedure: REPAIR OF RECTAL PROLAPSE;  Surgeon: Leonie Green, MD;  Location: [facility] ORS;  Service: General;  Laterality: N/A;     Prescriptions Prior to Admission  Medication Sig Dispense Refill Last Dose  . albuterol (PROVENTIL HFA;VENTOLIN HFA) 108 (90 Base) MCG/ACT inhaler Inhale 2 puffs into the lungs every 6 (six) hours as needed for wheezing or shortness of breath.    PRN at PRN  . ALPRAZolam (XANAX) 0.5 MG tablet Take 1 tablet (0.5 mg total) by mouth at bedtime as needed for anxiety. (Patient taking differently: Take 0.5 mg by mouth at bedtime. )  10 tablet 0 10/08/2016 at pm  . Ascorbic Acid (VITAMIN C) 1000 MG tablet Take 1,000 mg by mouth daily.   10/09/2016 at am  . b complex vitamins capsule Take 1 capsule by mouth daily. Reported on 06/08/2016   10/09/2016 at am  . Calcium 600-200 MG-UNIT tablet Take 1 tablet by mouth daily.   10/09/2016 at am  . cholecalciferol (VITAMIN D) 1000 units tablet Take 1,000 Units by mouth daily.   10/09/2016 at am  . Cyanocobalamin (VITAMIN B-12) 5000 MCG SUBL Place 1 tablet (5,000 mcg total) under the tongue daily. Daily (Patient taking differently: Place  1 tablet under the tongue daily. ) 30 tablet 0 10/09/2016 at am  . ferrous sulfate 325 (65 FE) MG tablet Take 1 tablet (325 mg total) by mouth daily with breakfast. 30 tablet 0 10/09/2016 at am  . Fluticasone-Salmeterol (ADVAIR) 500-50 MCG/DOSE AEPB Inhale 1 puff into the lungs 2 (two) times daily.   10/09/2016 at am  . ipratropium-albuterol (DUONEB) 0.5-2.5 (3) MG/3ML SOLN Take 3 mLs by nebulization every 6 (six) hours as needed. For wheezing/shortness of breath   PRN at PRN  . Multiple Vitamin (MULTIVITAMIN WITH MINERALS) TABS tablet Take 1 tablet by mouth daily. Reported on 06/08/2016   10/09/2016 at am  . omeprazole (PRILOSEC) 40 MG capsule Take 40 mg by mouth daily.   10/09/2016 at am  . oxyCODONE-acetaminophen (PERCOCET/ROXICET) 5-325 MG tablet Take 1 tablet by mouth 2 (two) times daily as needed for severe pain. (Patient taking differently: Take 1 tablet by mouth every morning. ) 10 tablet 0 10/09/2016 at am  . pramipexole (MIRAPEX) 0.5 MG tablet Take 0.5 mg by mouth at bedtime.    10/08/2016 at pm  . vitamin E 400 UNIT capsule Take 400 Units by mouth daily.   10/09/2016 at am  . diltiazem (CARDIZEM) 120 MG tablet Take 1 tablet (120 mg total) by mouth daily. (Patient not taking: Reported on 10/09/2016) 30 tablet 0 Not Taking  . furosemide (LASIX) 20 MG tablet Take 1 tablet (20 mg total) by mouth daily. (Patient not taking: Reported on 10/09/2016) 30 tablet 0 Not Taking    Physical Exam: Blood pressure 97/71, pulse (!) 138, temperature 98.6 F (37 C), temperature source Oral, resp. rate (!) 22, height '5\' 1"'$  (1.549 m), weight 66.1 kg (145 lb 12.8 oz), SpO2 96 %.   Wt Readings from Last 1 Encounters:  10/09/16 66.1 kg (145 lb 12.8 oz)     General appearance: alert and cooperative Head: Normocephalic, without obvious abnormality, atraumatic Resp: clear to auscultation bilaterally Cardio: irregularly irregular rhythm GI: soft, non-tender; bowel sounds normal; no masses,  no organomegaly Pulses: 2+  and symmetric Neurologic: Grossly normal  Labs:   Lab Results  Component Value Date   WBC 15.6 (H) 10/10/2016   HGB 6.9 (L) 10/10/2016   HCT 20.8 (L) 10/10/2016   MCV 104.6 (H) 10/10/2016   PLT 111 (L) 10/10/2016    Recent Labs Lab 10/10/16 0049  NA 139  K 4.8  CL 99*  CO2 35*  BUN 33*  CREATININE 0.77  CALCIUM 8.6*  GLUCOSE 137*   Lab Results  Component Value Date   CKTOTAL 51 10/22/2013   CKMB 1.1 10/22/2013   TROPONINI 0.04 ([place]) 10/10/2016      Radiology: CXR-cardiomegaly with no acute infiltrate EKG: afib with variable vr  ASSESSMENT AND PLAN:  82 yo with admission for afib with rvr. Pt has history of afib in the past. She recently had rectal  surgery. She felt weakness and fatigue and presented to the er where she was noted to be in afib with rvr. Initial hgb was 8.9 but subsequent hgb today was 6.9. She is not complaining of bleeding. Troponin does not represent acute coronary syndrome. Ate is improved with cardizem 60 q 8 but relatively hypotensive. Will need to consider transfusion. If rate remains lelevated after rehydration and/or transfusion and pressure remains low, will need ot consider digoxin. Echo pending. Not a candidate for chronic anticoagulation at present.  Signed: Teodoro Spray MD, [facility] 10/10/2016, 9:19 AM

## 2016-10-10 NOTE — Progress Notes (Signed)
Family Meeting Note  Advance Directive:yes  Today a meeting took place with the Patient.   The following clinical team members were present during this meeting:MD  Dr Benjie Karvonen The following were discussed:Patient's diagnosis: Atrial fibrillation , Patient's progosis: Undetermined and Goals for treatment: DNR  Ok to use pressors but no intubation/shock/defib ok to use CPAP if needed  Additional follow-up to be provided: heart rate control which affects SOB  Time spent during discussion:16 minutes  Jayme Cham, MD

## 2016-10-10 NOTE — Progress Notes (Signed)
*  PRELIMINARY RESULTS* Echocardiogram 2D Echocardiogram has been performed.  Sara Mccoy 10/10/2016, 11:08 AM

## 2016-10-10 NOTE — Progress Notes (Signed)
PT Cancellation Note  Patient Details Name: Sara Mccoy MRN: [id number] DOB: 1926/02/28   Cancelled Treatment:    Reason Eval/Treat Not Completed: Medical issues which prohibited therapy (hgb 6.9 and has not been medically addressed yet).  Will check later as pt and time allow.   Ramond Dial 10/10/2016, 2:52 PM    Mee Hives, PT MS Acute Rehab Dept. Number: [place] [facility] [place]

## 2016-10-10 NOTE — Progress Notes (Addendum)
[facility] [place] NAME: Sara Mccoy    MR#:  [id number]  DATE OF BIRTH:  03-Jun-1926  SUBJECTIVE:   Patient here with atrial fibrillation and palpitations. She reports she had an episode of atrial fibrillation in the remote past.  REVIEW OF SYSTEMS:    Review of Systems  Constitutional: Negative.  Negative for chills, fever and malaise/fatigue.  HENT: Negative.  Negative for ear discharge, ear pain, hearing loss, nosebleeds and sore throat.   Eyes: Negative.  Negative for blurred vision and pain.  Respiratory: Negative.  Negative for cough, hemoptysis, shortness of breath and wheezing.   Cardiovascular: Positive for palpitations. Negative for chest pain and leg swelling.  Gastrointestinal: Negative.  Negative for abdominal pain, blood in stool, diarrhea, nausea and vomiting.  Genitourinary: Negative.  Negative for dysuria.  Musculoskeletal: Negative.  Negative for back pain.  Skin: Negative.   Neurological: Negative for dizziness, tremors, speech change, focal weakness, seizures and headaches.  Endo/Heme/Allergies: Negative.  Does not bruise/bleed easily.  Psychiatric/Behavioral: Negative.  Negative for depression, hallucinations and suicidal ideas.    Tolerating Diet: yes      DRUG ALLERGIES:  No Known Allergies  VITALS:  Blood pressure 97/71, pulse (!) 138, temperature 98.6 F (37 C), temperature source Oral, resp. rate (!) 22, height 5' 1"  (1.549 m), weight 66.1 kg (145 lb 12.8 oz), SpO2 96 %.  PHYSICAL EXAMINATION:   Physical Exam  Constitutional: She is oriented to person, place, and time and well-developed, well-nourished, and in no distress. No distress.  HENT:  Head: Normocephalic.  Eyes: No scleral icterus.  Neck: Normal range of motion. Neck supple. No JVD present. No tracheal deviation present.  Cardiovascular: Exam reveals no gallop and no friction rub.   Murmur heard. Irr, irr tachy   Pulmonary/Chest:  Effort normal and breath sounds normal. No respiratory distress. She has no wheezes. She has no rales. She exhibits no tenderness.  Abdominal: Soft. Bowel sounds are normal. She exhibits no distension and no mass. There is no tenderness. There is no rebound and no guarding.  Musculoskeletal: Normal range of motion. She exhibits no edema.  Neurological: She is alert and oriented to person, place, and time.  Skin: Skin is warm. No rash noted. No erythema.  Psychiatric: Affect and judgment normal.      LABORATORY PANEL:   CBC  Recent Labs Lab 10/10/16 0049  WBC 15.6*  HGB 6.9*  HCT 20.8*  PLT 111*   ------------------------------------------------------------------------------------------------------------------  Chemistries   Recent Labs Lab 10/10/16 0049  NA 139  K 4.8  CL 99*  CO2 35*  GLUCOSE 137*  BUN 33*  CREATININE 0.77  CALCIUM 8.6*   ------------------------------------------------------------------------------------------------------------------  Cardiac Enzymes  Recent Labs Lab 10/09/16 1835 10/10/16 0049 10/10/16 0558  TROPONINI 0.04* 0.04* 0.04*   ------------------------------------------------------------------------------------------------------------------  RADIOLOGY:  Dg Chest Portable 1 View  Result Date: 10/09/2016 CLINICAL DATA:  Weakness, atrial fibrillation with rapid ventricular response, COPD, history lung cancer RIGHT upper lobe EXAM: PORTABLE CHEST 1 VIEW COMPARISON:  Portable exam 1946 hours compared to 07/25/2016 FINDINGS: Enlargement of cardiac silhouette. Atherosclerotic calcification and tortuosity of thoracic aorta. Mediastinal contours and pulmonary vascularity normal. Emphysematous changes with RIGHT basilar scarring. No definite acute infiltrate, pleural effusion or pneumothorax. Diffuse osseous demineralization with orthopedic hardware at LEFT humeral diaphysis distally. IMPRESSION: Enlargement of cardiac silhouette. RIGHT  basilar scarring without acute infiltrate. Aortic atherosclerosis. Electronically Signed   By: Lavonia Dana M.D.   On: 10/09/2016 20:01  ASSESSMENT AND PLAN:    82 year old female with history of COPD and right upper lobe cancer who presents with atrial fibrillation.  1. Atrial fibrillation: Cardiology consult Continue diltiazem however blood pressure remains low then we'll need to add digoxin or amiodarone if does not come up with blood transfusion Check TSH and echo [place].  2. Elevated troponins: This is due to demand ischemia and not ACS.  3. History of COPD without exacerbation: Continue inhalers  4. GERD: Continue PPI  5. Acute blood loss Anemia:She has a history of chronic macrocytic anemia and a monoclonal gammopathy and evaluation at the cancer clinic.Marland Kitchen Her B12 folate and iron levels have been good. There is concern about possible myelodysplastic syndrome. Bone marrow biopsy has been discussed but patient was preferring not to have biopsy. She has had blood transfusions in the past  Order iron panel and transfuse 1 unit of PRBCs  5. Recent rectal prolapse surgery: Doing fine from this   Physical therapy consult for disposition   Rounded with nursing staff  Management plans discussed with the patient and she is in agreement.  CODE STATUS: DO NOT RESUSCITATE  TOTAL TIME TAKING CARE OF THIS PATIENT: 30 minutes.     POSSIBLE D/C 1-2 days, DEPENDING ON CLINICAL CONDITION.   Delorse Shane M.D on 10/10/2016 at 8:24 AM  Between 7am to 6pm - Pager - [phone] After 6pm go to [email or web address] - password [email or web address] [facility]  Office  [phone]  CC: Primary care physician; Tracie Harrier, MD  Note: This dictation was prepared with Dragon dictation along with smaller phrase technology. Any transcriptional errors that result from this process are unintentional.

## 2016-10-11 ENCOUNTER — Inpatient Hospital Stay: Payer: PPO

## 2016-10-11 LAB — TYPE AND SCREEN
ABO/RH(D): O POS
Antibody Screen: NEGATIVE
UNIT DIVISION: 0

## 2016-10-11 LAB — URINALYSIS COMPLETE WITH MICROSCOPIC (ARMC ONLY)
BACTERIA UA: NONE SEEN
BILIRUBIN URINE: NEGATIVE
Glucose, UA: NEGATIVE mg/dL
KETONES UR: NEGATIVE mg/dL
LEUKOCYTES UA: NEGATIVE
NITRITE: NEGATIVE
PH: 5 (ref 5.0–8.0)
PROTEIN: NEGATIVE mg/dL
SPECIFIC GRAVITY, URINE: 1.006 (ref 1.005–1.030)

## 2016-10-11 LAB — BLOOD GAS, ARTERIAL
ACID-BASE EXCESS: 13.7 mmol/L — AB (ref 0.0–2.0)
BICARBONATE: 41.4 mmol/L — AB (ref 20.0–28.0)
FIO2: 0.28
O2 SAT: 92.9 %
PCO2 ART: 75 mmHg — AB (ref 32.0–48.0)
PO2 ART: 70 mmHg — AB (ref 83.0–108.0)
Patient temperature: 37
pH, Arterial: 7.35 (ref 7.350–7.450)

## 2016-10-11 LAB — ECHOCARDIOGRAM COMPLETE
HEIGHTINCHES: 61 in
WEIGHTICAEL: 2332.8 [oz_av]

## 2016-10-11 LAB — GLUCOSE, CAPILLARY: GLUCOSE-CAPILLARY: 202 mg/dL — AB (ref 65–99)

## 2016-10-11 MED ORDER — PIPERACILLIN-TAZOBACTAM 3.375 G IVPB
3.3750 g | Freq: Three times a day (TID) | INTRAVENOUS | Status: DC
  Administered 2016-10-11 – 2016-10-12 (×4): 3.375 g via INTRAVENOUS
  Filled 2016-10-11 (×5): qty 50

## 2016-10-11 MED ORDER — IPRATROPIUM-ALBUTEROL 0.5-2.5 (3) MG/3ML IN SOLN
3.0000 mL | RESPIRATORY_TRACT | Status: DC
  Administered 2016-10-11 – 2016-10-13 (×10): 3 mL via RESPIRATORY_TRACT
  Filled 2016-10-11 (×10): qty 3

## 2016-10-11 MED ORDER — METHYLPREDNISOLONE SODIUM SUCC 40 MG IJ SOLR
40.0000 mg | Freq: Two times a day (BID) | INTRAMUSCULAR | Status: DC
  Administered 2016-10-11 – 2016-10-12 (×3): 40 mg via INTRAVENOUS
  Filled 2016-10-11 (×3): qty 1

## 2016-10-11 MED ORDER — ENSURE ENLIVE PO LIQD: 237.0000 mL | Freq: Two times a day (BID) | ORAL | Status: DC

## 2016-10-11 MED ORDER — FUROSEMIDE 10 MG/ML IJ SOLN
20.0000 mg | Freq: Once | INTRAMUSCULAR | Status: AC
  Administered 2016-10-11: 20 mg via INTRAVENOUS
  Filled 2016-10-11: qty 2

## 2016-10-11 MED ORDER — MORPHINE SULFATE (PF) 2 MG/ML IV SOLN
0.5000 mg | Freq: Once | INTRAVENOUS | Status: AC
  Administered 2016-10-11: 0.5 mg via INTRAVENOUS
  Filled 2016-10-11: qty 1

## 2016-10-11 NOTE — Progress Notes (Signed)
Dr. Benjie Karvonen called back with orders for 1x dose IV morphine, ABG and NMP.

## 2016-10-11 NOTE — Progress Notes (Addendum)
Patient is drowsy/lethargic again, tachypneic RR 30s-40s: using abdominal muscles, diaphoretic. Pt is also warm to touch w/o fever, see flow sheets for other vital signs. CBG WNL.Text page to Dr. Benjie Karvonen request for ABG. Awaiting call back.

## 2016-10-11 NOTE — Progress Notes (Signed)
Pharmacy Antibiotic Note  Sara Mccoy is a 82 y.o. female admitted on 10/09/2016 with Aspiration pneumonia.  Pharmacy has been consulted for Zosyn dosing.  Plan: Will start the patient on Zosyn 3.375 IV EI every 8 hours.   Height: '5\' 1"'$  (154.9 cm) Weight: 145 lb 12.8 oz (66.1 kg) IBW/kg (Calculated) : 47.8  Temp (24hrs), Avg:98.3 F (36.8 C), Min:97.9 F (36.6 C), Max:98.7 F (37.1 C)   Recent Labs Lab 10/09/16 1835 10/10/16 0049  WBC 16.1* 15.6*  CREATININE 0.84 0.77    Estimated Creatinine Clearance: 42.3 mL/min (by C-G formula based on SCr of 0.77 mg/dL).    No Known Allergies  Antimicrobials this admission: 11/5 Zosyn >>    Dose adjustments this admission:   Microbiology results:  Thank you for allowing pharmacy to be a part of this patient's care.  Pernell Dupre, PharmD Clinical Pharmacist  10/11/2016 11:02 AM

## 2016-10-11 NOTE — Progress Notes (Signed)
PT Cancellation Note  Patient Details Name: Sara Mccoy MRN: [id number] DOB: 1926/11/14   Cancelled Treatment:    Reason Eval/Treat Not Completed:  (Refused and had CHF issues from transfusion).  Will try again in AM as pt is motivated to be up.  Home alone and daughter states she can stay with pt if needed.   Ramond Dial 10/11/2016, 12:41 PM    Mee Hives, PT MS Acute Rehab Dept. Number: [facility] [phone] and [place], PT MS Acute Rehab Dept. Number: [place] [facility] [place]

## 2016-10-11 NOTE — Progress Notes (Signed)
Initial Nutrition Assessment  DOCUMENTATION CODES:   Severe malnutrition in context of chronic illness  INTERVENTION:  1. Ensure Enlive po BID, each supplement provides 350 kcal and 20 grams of protein 2. Strawberry yogurt between meals  NUTRITION DIAGNOSIS:   Malnutrition related to chronic illness as evidenced by severe depletion of muscle mass, severe depletion of body fat, percent weight loss.  GOAL:   Patient will meet greater than or equal to 90% of their needs  MONITOR:   PO intake, Supplement acceptance, I & O's, Labs, Weight trends  REASON FOR ASSESSMENT:   Malnutrition Screening Tool    ASSESSMENT:   Sara Mccoy  is a 82 y.o. female who presents with Chest tightness and shortness of breath. The patient arrived in the ED she was found to be in A. fib with RVR. She was given a couple doses of diltiazem with good response, but not complete control of her heart rate  Spoke with Sara Mccoy's daughter at bedside. Patient was sleepy today, did not say much during visit. Daughter endorses patient has had good PO intake at home, "eating 3 meals a day and drinking boost/eating yogurt faithfully." Patient has a history of weight loss, currently exhibiting a 9#/5.8% severe wt loss over 1 month, but she has actually gained 13# since 9/30.  Daughter denies any concerns with nausea/vomiting or issues chewing/swallowing/choking Patient did not eat breakfast this morning. Daughter states patient was confused, not herself. Meal completion so far: 25-100%  Nutrition-Focused physical exam completed. Findings are moderate-severe fat depletion, moderate-severe muscle depletion, and no edema.   Labs and medications reviewed: Solumedrol  Diet Order:  Diet Heart Room service appropriate? Yes; Fluid consistency: Thin  Skin:  Reviewed, no issues  Last BM:  10/08/2016  Height:   Ht Readings from Last 1 Encounters:  10/09/16 '5\' 1"'$  (1.549 m)    Weight:   Wt Readings from  Last 1 Encounters:  10/09/16 145 lb 12.8 oz (66.1 kg)    Ideal Body Weight:  47.72 kg  BMI:  Body mass index is 27.55 kg/m.  Estimated Nutritional Needs:   Kcal:  1350-1550 calories  Protein:  65-80 gm  Fluid:  >/= 1.35L  EDUCATION NEEDS:   No education needs identified at this time  Satira Anis. Emerald Shor, MS, RD LDN Inpatient Clinical Dietitian Pager [phone]

## 2016-10-11 NOTE — Progress Notes (Signed)
Dr. Benjie Karvonen paged with ABG results to review.

## 2016-10-11 NOTE — Progress Notes (Signed)
[employer] [place] NAME: Sara Mccoy    MR#:  [id number]  DATE OF BIRTH:  [birth year]  SUBJECTIVE:   Patient has weakness this am and increased work of breathing. Chest x-ray this morning shows possible pneumonia/aspiration patient herself feels weak  REVIEW OF SYSTEMS:    Review of Systems  Constitutional: Positive for malaise/fatigue. Negative for chills and fever.  HENT: Negative.  Negative for ear discharge, ear pain, hearing loss, nosebleeds and sore throat.   Eyes: Negative.  Negative for blurred vision and pain.  Respiratory: Positive for cough and shortness of breath. Negative for hemoptysis and wheezing.   Cardiovascular: Negative for chest pain and leg swelling.  Gastrointestinal: Negative.  Negative for abdominal pain, blood in stool, diarrhea, nausea and vomiting.  Genitourinary: Negative.  Negative for dysuria.  Musculoskeletal: Negative.  Negative for back pain.  Skin: Negative.   Neurological: Positive for weakness. Negative for dizziness, tremors, speech change, focal weakness, seizures and headaches.  Endo/Heme/Allergies: Negative.  Does not bruise/bleed easily.  Psychiatric/Behavioral: Negative.  Negative for depression, hallucinations and suicidal ideas.    Tolerating Diet: yes      DRUG ALLERGIES:  No Known Allergies  VITALS:  Blood pressure (!) 118/49, pulse (!) 118, temperature 97.9 F (36.6 C), temperature source Oral, resp. rate (!) 40, height 5' 1"  (1.549 m), weight 66.1 kg (145 lb 12.8 oz), SpO2 91 %.  PHYSICAL EXAMINATION:   Physical Exam  Constitutional: She is oriented to person, place, and time and well-developed, well-nourished, and in no distress. No distress.  HENT:  Head: Normocephalic.  Eyes: No scleral icterus.  Neck: Normal range of motion. Neck supple. No JVD present. No tracheal deviation present.  Cardiovascular: Exam reveals no gallop and no friction rub.   Murmur heard. Irr,  irr tachy   Pulmonary/Chest: Effort normal. No respiratory distress. She has wheezes. She has no rales. She exhibits no tenderness.  Abdominal: Soft. Bowel sounds are normal. She exhibits no distension and no mass. There is no tenderness. There is no rebound and no guarding.  Musculoskeletal: Normal range of motion. She exhibits no edema.  Neurological: She is alert and oriented to person, place, and time.  Skin: Skin is warm. No rash noted. No erythema.  Psychiatric: Affect and judgment normal.      LABORATORY PANEL:   CBC  Recent Labs Lab 10/10/16 0049 10/10/16 1634  WBC 15.6*  --   HGB 6.9* 8.4*  HCT 20.8* 25.4*  PLT 111*  --    ------------------------------------------------------------------------------------------------------------------  Chemistries   Recent Labs Lab 10/10/16 0049  NA 139  K 4.8  CL 99*  CO2 35*  GLUCOSE 137*  BUN 33*  CREATININE 0.77  CALCIUM 8.6*   ------------------------------------------------------------------------------------------------------------------  Cardiac Enzymes  Recent Labs Lab 10/10/16 0049 10/10/16 0558 10/10/16 1146  TROPONINI 0.04* 0.04* 0.03*   ------------------------------------------------------------------------------------------------------------------  RADIOLOGY:  Dg Chest Port 1 View  Result Date: 10/11/2016 CLINICAL DATA:  Rows, atrial fibrillation, palpitations, COPD, RIGHT upper lobe lung cancer, former smoker EXAM: PORTABLE CHEST 1 VIEW COMPARISON:  Portable exam 0930 hours compared to 10/09/2016 FINDINGS: Enlargement of cardiac silhouette. Atherosclerotic calcification aorta. Mediastinal contours and pulmonary vascularity normal. New LEFT lower lobe infiltrate question pneumonia though in the appropriate clinical setting aspiration should be considered. Underlying COPD changes with postsurgical changes of the RIGHT hemithorax. Small BILATERAL pleural effusions. No pneumothorax. Diffuse osseous  demineralization. IMPRESSION: Enlargement of cardiac silhouette. Post thoracotomy changes RIGHT chest with small bibasilar effusions  and new LEFT lower lobe consolidation question pneumonia or, in the appropriate clinical setting, aspiration. Electronically Signed   By: Lavonia Dana M.D.   On: 10/11/2016 09:50   Dg Chest Portable 1 View  Result Date: 10/09/2016 CLINICAL DATA:  Weakness, atrial fibrillation with rapid ventricular response, COPD, history lung cancer RIGHT upper lobe EXAM: PORTABLE CHEST 1 VIEW COMPARISON:  Portable exam 1946 hours compared to 07/25/2016 FINDINGS: Enlargement of cardiac silhouette. Atherosclerotic calcification and tortuosity of thoracic aorta. Mediastinal contours and pulmonary vascularity normal. Emphysematous changes with RIGHT basilar scarring. No definite acute infiltrate, pleural effusion or pneumothorax. Diffuse osseous demineralization with orthopedic hardware at LEFT humeral diaphysis distally. IMPRESSION: Enlargement of cardiac silhouette. RIGHT basilar scarring without acute infiltrate. Aortic atherosclerosis. Electronically Signed   By: Lavonia Dana M.D.   On: 10/09/2016 20:01     ASSESSMENT AND PLAN:    82 year old female with history of COPD and right upper lobe cancer who presents with atrial fibrillation.  1. Atrial fibrillation With RVR: Continue diltiazem for heart rate control as well as the [place]. Echocardiogram shows ejection fraction of 45-50% with mild pulmonary hypertension.  2. Shortness of breath : X-ray today shows left lobe pneumonia versus aspiration Start Zosyn and repeat x-ray in a.m. Patient also with ejection fraction of 45% and received 1 unit of PRBCs and therefore may have a component of mild pulmonary edema. One-time dose of IV Lasix given   3. Elevated troponins: This is due to demand ischemia and not ACS.  4. History of COPD : Start steroids in light of patient complaining of shortness of breath DuoNeb's and inhalers 5  GERD: Continue PPI  6. Acute blood loss Anemia: She has a history of chronic macrocytic anemia and a monoclonal gammopathy and evaluation at the cancer clinic. There is concern about possible myelodysplastic syndrome. Bone marrow biopsy has been discussed but patient was preferring not to have biopsy.  Status post 1 unit PRBC Hemoglobin 8.4 this morning.   7. Recent rectal prolapse surgery:  8. Acute on chronic systolic heart failure: One-time dose of IV Lasix given. Repeat chest x-ray and evaluate for need for Lasix in a.m.  Physical therapy consult for disposition   Rounded with nursing staff  Management plans discussed with the patient and she is in agreement.  CODE STATUS: DO NOT RESUSCITATE  TOTAL TIME TAKING CARE OF THIS PATIENT: 29 minutes.     POSSIBLE D/C 2-3 days DEPENDING ON CLINICAL CONDITION.   Eliot Popper M.D on 10/11/2016 at 10:47 AM  Between 7am to 6pm - Pager - [phone] After 6pm go to [email or web address] - password [email or web address] [facility]  Office  [phone]  CC: Primary care physician; Tracie Harrier, MD  Note: This dictation was prepared with Dragon dictation along with smaller phrase technology. Any transcriptional errors that result from this process are unintentional.

## 2016-10-11 NOTE — Progress Notes (Addendum)
Patient drowsy and disoriented to time, reports it is 50. Tachypneic, RR 30s-40s, O2 sats 92% on chronic 3L [place]; DOE and with conversation, BP WNL, see flowsheets. MD at bedside at this time, orders for STAT portable CXR and 20 IV lasix.

## 2016-10-12 ENCOUNTER — Inpatient Hospital Stay: Payer: PPO

## 2016-10-12 ENCOUNTER — Ambulatory Visit: Payer: Self-pay | Admitting: *Deleted

## 2016-10-12 LAB — BASIC METABOLIC PANEL
Anion gap: 6 (ref 5–15)
BUN: 42 mg/dL — ABNORMAL HIGH (ref 6–20)
CALCIUM: 8.6 mg/dL — AB (ref 8.9–10.3)
CHLORIDE: 97 mmol/L — AB (ref 101–111)
CO2: 38 mmol/L — AB (ref 22–32)
CREATININE: 0.99 mg/dL (ref 0.44–1.00)
GFR calc non Af Amer: 49 mL/min — ABNORMAL LOW (ref >60)
GFR, EST AFRICAN AMERICAN: 57 mL/min — AB (ref >60)
GLUCOSE: 159 mg/dL — AB (ref 65–99)
Potassium: 4.3 mmol/L (ref 3.5–5.1)
Sodium: 141 mmol/L (ref 135–145)

## 2016-10-12 LAB — CBC
HEMATOCRIT: 25.7 % — AB (ref 35.0–47.0)
HEMOGLOBIN: 8.6 g/dL — AB (ref 12.0–16.0)
MCH: 33.6 pg (ref 26.0–34.0)
MCHC: 33.4 g/dL (ref 32.0–36.0)
MCV: 100.8 fL — AB (ref 80.0–100.0)
Platelets: 117 10*3/uL — ABNORMAL LOW (ref 150–440)
RBC: 2.55 MIL/uL — ABNORMAL LOW (ref 3.80–5.20)
RDW: 24 % — AB (ref 11.5–14.5)
WBC: 11.1 10*3/uL — ABNORMAL HIGH (ref 3.6–11.0)

## 2016-10-12 MED ORDER — CALCIUM CARBONATE-VITAMIN D 500-200 MG-UNIT PO TABS
1.0000 | ORAL_TABLET | Freq: Every day | ORAL | Status: DC
  Administered 2016-10-12 – 2016-10-13 (×2): 1 via ORAL
  Filled 2016-10-12 (×2): qty 1

## 2016-10-12 MED ORDER — PREDNISONE 50 MG PO TABS
50.0000 mg | ORAL_TABLET | Freq: Every day | ORAL | Status: DC
  Administered 2016-10-13: 50 mg via ORAL
  Filled 2016-10-12: qty 1

## 2016-10-12 MED ORDER — VITAMIN E 180 MG (400 UNIT) PO CAPS
400.0000 [IU] | ORAL_CAPSULE | Freq: Every day | ORAL | Status: DC
  Administered 2016-10-12 – 2016-10-13 (×2): 400 [IU] via ORAL
  Filled 2016-10-12 (×2): qty 1

## 2016-10-12 MED ORDER — FERROUS SULFATE 325 (65 FE) MG PO TABS
325.0000 mg | ORAL_TABLET | Freq: Every day | ORAL | Status: DC
  Administered 2016-10-13: 325 mg via ORAL
  Filled 2016-10-12: qty 1

## 2016-10-12 MED ORDER — FUROSEMIDE 20 MG PO TABS
20.0000 mg | ORAL_TABLET | Freq: Every day | ORAL | Status: DC
  Administered 2016-10-12 – 2016-10-13 (×2): 20 mg via ORAL
  Filled 2016-10-12 (×2): qty 1

## 2016-10-12 MED ORDER — ADULT MULTIVITAMIN W/MINERALS CH
1.0000 | ORAL_TABLET | Freq: Every day | ORAL | Status: DC
  Administered 2016-10-12 – 2016-10-13 (×2): 1 via ORAL
  Filled 2016-10-12 (×2): qty 1

## 2016-10-12 MED ORDER — FERROUS SULFATE 325 (65 FE) MG PO TABS
325.0000 mg | ORAL_TABLET | Freq: Every day | ORAL | Status: DC
  Administered 2016-10-12: 325 mg via ORAL
  Filled 2016-10-12: qty 1

## 2016-10-12 MED ORDER — IPRATROPIUM-ALBUTEROL 0.5-2.5 (3) MG/3ML IN SOLN: 3.0000 mL | Freq: Four times a day (QID) | RESPIRATORY_TRACT | Status: DC | PRN

## 2016-10-12 MED ORDER — VITAMIN D 1000 UNITS PO TABS
1000.0000 [IU] | ORAL_TABLET | Freq: Every day | ORAL | Status: DC
  Administered 2016-10-12 – 2016-10-13 (×2): 1000 [IU] via ORAL
  Filled 2016-10-12 (×2): qty 1

## 2016-10-12 MED ORDER — ALBUTEROL SULFATE (2.5 MG/3ML) 0.083% IN NEBU: 2.5000 mg | INHALATION_SOLUTION | Freq: Four times a day (QID) | RESPIRATORY_TRACT | Status: DC | PRN

## 2016-10-12 MED ORDER — LEVOFLOXACIN 500 MG PO TABS
250.0000 mg | ORAL_TABLET | Freq: Every day | ORAL | Status: DC
  Administered 2016-10-12 – 2016-10-13 (×2): 250 mg via ORAL
  Filled 2016-10-12 (×2): qty 1

## 2016-10-12 NOTE — Progress Notes (Signed)
[facility] [place] [facility] [place] NAME: Sheretha Shadd    MR#:  [id number]  DATE OF BIRTH:  November 06, 1926  SUBJECTIVE:   Poor historian. Says not feeling well.  No new issues per RN  REVIEW OF SYSTEMS:    Review of Systems  Constitutional: Positive for malaise/fatigue. Negative for chills and fever.  HENT: Negative.  Negative for ear discharge, ear pain, hearing loss, nosebleeds and sore throat.   Eyes: Negative.  Negative for blurred vision and pain.  Respiratory: Positive for cough and shortness of breath. Negative for hemoptysis and wheezing.   Cardiovascular: Negative for chest pain and leg swelling.  Gastrointestinal: Negative.  Negative for abdominal pain, blood in stool, diarrhea, nausea and vomiting.  Genitourinary: Negative.  Negative for dysuria.  Musculoskeletal: Negative.  Negative for back pain.  Skin: Negative.   Neurological: Positive for weakness. Negative for dizziness, tremors, speech change, focal weakness, seizures and headaches.  Endo/Heme/Allergies: Negative.  Does not bruise/bleed easily.  Psychiatric/Behavioral: Negative.  Negative for depression, hallucinations and suicidal ideas.   Tolerating Diet: yes  DRUG ALLERGIES:  No Known Allergies  VITALS:  Blood pressure (!) 107/37, pulse 81, temperature 97.6 F (36.4 C), resp. rate 19, height 5' 1"  (1.549 m), weight 66.1 kg (145 lb 12.8 oz), SpO2 99 %.  PHYSICAL EXAMINATION:   Physical Exam  Constitutional: She is oriented to person, place, and time and well-developed, well-nourished, and in no distress. No distress.  HENT:  Head: Normocephalic.  Eyes: No scleral icterus.  Neck: Normal range of motion. Neck supple. No JVD present. No tracheal deviation present.  Cardiovascular: Exam reveals no gallop and no friction rub.   Murmur heard. Irr, irr tachy   Pulmonary/Chest: Effort normal. No respiratory distress. She has wheezes. She has no rales. She exhibits no tenderness.   Abdominal: Soft. Bowel sounds are normal. She exhibits no distension and no mass. There is no tenderness. There is no rebound and no guarding.  Musculoskeletal: Normal range of motion. She exhibits no edema.  Neurological: She is alert and oriented to person, place, and time.  Skin: Skin is warm. No rash noted. No erythema.  Psychiatric: Affect and judgment normal.      LABORATORY PANEL:   CBC  Recent Labs Lab 10/12/16 0653  WBC 11.1*  HGB 8.6*  HCT 25.7*  PLT 117*   ------------------------------------------------------------------------------------------------------------------  Chemistries   Recent Labs Lab 10/12/16 0653  NA 141  K 4.3  CL 97*  CO2 38*  GLUCOSE 159*  BUN 42*  CREATININE 0.99  CALCIUM 8.6*   ------------------------------------------------------------------------------------------------------------------  Cardiac Enzymes  Recent Labs Lab 10/10/16 0049 10/10/16 0558 10/10/16 1146  TROPONINI 0.04* 0.04* 0.03*   ------------------------------------------------------------------------------------------------------------------  RADIOLOGY:  Dg Chest 1 View  Result Date: 10/12/2016 CLINICAL DATA:  Hypoxia on BiPAP machine. Previous right upper lobectomy 1997. EXAM: CHEST 1 VIEW COMPARISON:  10/11/2016 FINDINGS: Lungs are adequately inflated and demonstrate continued bibasilar opacification suggesting bilateral effusions with atelectasis, although cannot exclude infection in the lung bases. Moderate stable cardiomegaly. Calcified plaque over the thoracic aorta. Degenerative change of the spine. Post thoracotomy changes of the right chest wall. IMPRESSION: Persistent hazy bibasilar opacification likely effusions with atelectasis, although cannot exclude infection. Stable moderate cardiomegaly. Electronically Signed   By: Marin Olp M.D.   On: 10/12/2016 07:35   Dg Chest Port 1 View  Result Date: 10/11/2016 CLINICAL DATA:  Rows, atrial  fibrillation, palpitations, COPD, RIGHT upper lobe lung cancer, former smoker EXAM: PORTABLE CHEST  1 VIEW COMPARISON:  Portable exam 0930 hours compared to 10/09/2016 FINDINGS: Enlargement of cardiac silhouette. Atherosclerotic calcification aorta. Mediastinal contours and pulmonary vascularity normal. New LEFT lower lobe infiltrate question pneumonia though in the appropriate clinical setting aspiration should be considered. Underlying COPD changes with postsurgical changes of the RIGHT hemithorax. Small BILATERAL pleural effusions. No pneumothorax. Diffuse osseous demineralization. IMPRESSION: Enlargement of cardiac silhouette. Post thoracotomy changes RIGHT chest with small bibasilar effusions and new LEFT lower lobe consolidation question pneumonia or, in the appropriate clinical setting, aspiration. Electronically Signed   By: Lavonia Dana M.D.   On: 10/11/2016 09:50     ASSESSMENT AND PLAN:    82 year old female with history of COPD and right upper lobe cancer who presents with atrial fibrillation.  1. Atrial fibrillation With RVR: Continue diltiazem for heart rate control  Echocardiogram shows ejection fraction of 45-50% with mild pulmonary hypertension. -HR in the 80's  2. Shortness of breath : X-ray today shows left lobe pneumonia versus aspiration Was on IV Zosyn ---change to po levaquin -Patient also with ejection fraction of 45% and received 1 unit of PRBCs and therefore may have a component of mild pulmonary edema. One-time dose of IV Lasix given yday  3. Elevated troponins: This is due to demand ischemia and not ACS.  4. History of COPD : Start steroids in light of patient complaining of shortness of breath DuoNeb's and inhalers 5 GERD: Continue PPI  6. Acute blood loss Anemia: She has a history of chronic macrocytic anemia and a monoclonal gammopathy and evaluation at the cancer clinic. There is concern about possible myelodysplastic syndrome. Bone marrow biopsy has been  discussed but patient was preferring not to have biopsy.  Status post 1 unit PRBC Hemoglobin 8.4 stable  7. Recent rectal prolapse surgery  8. Acute on chronic systolic heart failure: One-time dose of IV Lasix given.  -will place her on lasix 20 mg daily for next few days  Rounded with nursing staff  Management plans discussed with the patient and she is in agreement.  CODE STATUS: DO NOT RESUSCITATE  TOTAL TIME TAKING CARE OF THIS PATIENT: 30 minutes.   POSSIBLE D/C 1-2 days DEPENDING ON CLINICAL CONDITION.   Pamila Mendibles M.D on 10/12/2016 at 5:47 PM  Between 7am to 6pm - Pager - [phone] After 6pm go to [email or web address] - password [email or web address] [facility]  Office  [phone]  CC: Primary care physician; Tracie Harrier, MD  Note: This dictation was prepared with Dragon dictation along with smaller phrase technology. Any transcriptional errors that result from this process are unintentional.

## 2016-10-12 NOTE — Care Management (Signed)
Patient admitted with atrial fibrillation with RVR.  Required IV push cardizem.  During the past 24 hours she has had episodes of tachypnea, diaphoresis. She is being treated for possible pneumonia and has received dose of IV lasix and currently receiving IV steroids and oral levaquin.  Physical therapy has recommended skilled nursing.  When CM attempted to address disposition but patient does not appear to hear well or drowsy from being awakened. Has has a recent skilled nursing stay.  [facility] is aware of admission.

## 2016-10-12 NOTE — Evaluation (Signed)
Clinical/Bedside Swallow Evaluation Patient Details  Name: Sara Mccoy MRN: [id number] Date of Birth: 01-Mar-1926  Today's Date: 10/12/2016 Time: SLP Start Time (ACUTE ONLY): 64 SLP Stop Time (ACUTE ONLY): 1130 SLP Time Calculation (min) (ACUTE ONLY): 60 min  Past Medical History:  Past Medical History:  Diagnosis Date  . Anemia   . Anxiety   . COPD (chronic obstructive pulmonary disease) ([place])   . GERD (gastroesophageal reflux disease)   . Lung cancer, upper lobe ([place]) 1997   right upper lobe  . Prolapse of intestine   . Raynaud's disease    Past Surgical History:  Past Surgical History:  Procedure Laterality Date  . ABDOMINAL HYSTERECTOMY    . CESAREAN SECTION    . CHOLECYSTECTOMY    . FLEXIBLE SIGMOIDOSCOPY N/A 04/25/2016   Procedure: FLEXIBLE SIGMOIDOSCOPY;  Surgeon: Lucilla Lame, MD;  Location: [facility] ENDOSCOPY;  Service: Endoscopy;  Laterality: N/A;  . JOINT REPLACEMENT    . LUNG SURGERY Right 1997   RUL removed  . REPAIR OF RECTAL PROLAPSE N/A 09/09/2016   Procedure: REPAIR OF RECTAL PROLAPSE;  Surgeon: Leonie Green, MD;  Location: [facility] ORS;  Service: General;  Laterality: N/A;   HPI:  Pt is a 82 y.o. female who presents with chest tightness and shortness of breath. The patient arrived in the ED she was found to be in A. fib with RVR. She was given a couple doses of diltiazem with good response, but not complete control of her heart rate. Troponin was very mildly elevated and she had a little bit of leukocytosis.  Pt recently admitted to hospital in 07/2016 due to onset of anemia and respiratory failure.  From there went to [facility] where she stayed until ~9/28 per pt report. Pt has a h/o lung Ca, COPD.    Assessment / Plan / Recommendation Clinical Impression  Pt appears to adequately tolerate trials of soft foods/purees(easier for mastication secondary not having dentititon for fully masticating solids) and thin liquids via cup and straw w/ no  overt s/s of aspiration noted; oral phase appropriate for boluses given(management and oral clearing). Pt helped to feed self given setup assistance. Pt indicated she did not have much appetite "at times". Recommend f/u w/ Dietician for support. Recommend a Dysphagia 3 diet w/ thin liquids; general aspiration precautions; meds in Puree for easier swallowing if easier. Tray setup at meals. ST services will be available for any further education/assessment if needed while admitted but no further needs indicated at this time. NSG updated. Pt agreed.    Aspiration Risk   (reduced)    Diet Recommendation  Dysphagia 3(easier mastication), Thin liquids; aspiration precautions; meds in Puree - whole.   Medication Administration: Whole meds with puree    Other  Recommendations Recommended Consults:  (Dietician) Oral Care Recommendations: Oral care BID;Staff/trained caregiver to provide oral care   Follow up Recommendations None      Frequency and Duration            Prognosis Prognosis for Safe Diet Advancement: Good      Swallow Study   General Date of Onset: 10/09/16 HPI: Pt is a 82 y.o. female who presents with chest tightness and shortness of breath. The patient arrived in the ED she was found to be in A. fib with RVR. She was given a couple doses of diltiazem with good response, but not complete control of her heart rate. Troponin was very mildly elevated and she had a little bit of leukocytosis.  Pt recently admitted to hospital in 07/2016 due to onset of anemia and respiratory failure.  From there went to [facility] where she stayed until ~9/28 per pt report. Pt has a h/o lung Ca, COPD.  Type of Study: Bedside Swallow Evaluation Previous Swallow Assessment: none indicated Diet Prior to this Study: Dysphagia 3 (soft);Thin liquids Temperature Spikes Noted: No (wbc 11.1) Respiratory Status: Nasal cannula (4 liters) History of Recent Intubation: No Behavior/Cognition:  Alert;Cooperative;Pleasant mood;Distractible Oral Cavity Assessment: Within Functional Limits;Dry Oral Care Completed by SLP: Recent completion by staff Oral Cavity - Dentition:  (bottom dentition but does not wear the upper denture) Vision: Functional for self-feeding Self-Feeding Abilities: Able to feed self;Needs assist;Needs set up Patient Positioning: Upright in bed Baseline Vocal Quality: Normal Volitional Cough: Strong Volitional Swallow: Able to elicit    Oral/Motor/Sensory Function Overall Oral Motor/Sensory Function: Within functional limits   Ice Chips Ice chips: Within functional limits Presentation: Spoon (3 trials)   Thin Liquid Thin Liquid: Within functional limits Presentation: Cup;Self Fed;Straw (supported; ~3 ozs total) Other Comments: shaky UEs    Nectar Thick Nectar Thick Liquid: Not tested   Honey Thick Honey Thick Liquid: Not tested   Puree Puree: Within functional limits Presentation: Spoon;Self Fed (~3 ozs total)   Solid   GO   Solid: Impaired Presentation: Self Fed;Spoon (2 trials of broken down solids) Oral Phase Impairments:  (lacking dentition) Oral Phase Functional Implications:  (increased time) Pharyngeal Phase Impairments:  (none)         Orinda Kenner, MS, CCC-SLP  Kortne All 10/12/2016,2:53 PM

## 2016-10-12 NOTE — Evaluation (Signed)
Physical Therapy Evaluation Patient Details Name: Aaralyn Kil MRN: [id number] DOB: [birth year] Today's Date: 10/12/2016   History of Present Illness  Pt admitted with tachycardia and shortness of breath symptoms. Pt is now admitted with Afib with RVR.  She is now s/p rectal prolapse surgery on 09/09/16.  Pt recently admitted to hospital in 07/2016 due to onset of anemia and respiratory failure.  From there went to [facility] where she stayed until ~9/28 pre pt report. Pt's PMH includes prolapse of intestine, lung cancer (upper lobe), COPD, anemia.   Clinical Impression  Pt is a pleasant 82 year old female who was admitted for Afib with RVR . Pt performs bed mobility with min assist and refuses to further perform OOB mobility at this time. Will assess further mobility another date. Per patient, she was receiving HHPT and was able to navigate household distances with rollater and spends most of her day in lift chair. Pt currently fatigues while sitting at EOB. Not at baseline level at this time. Pt demonstrates deficits with strength/endurance/mobility. Would benefit from skilled PT to address above deficits and promote optimal return to PLOF; recommend transition to STR upon discharge from acute hospitalization.       Follow Up Recommendations SNF    Equipment Recommendations       Recommendations for Other Services       Precautions / Restrictions Precautions Precautions: Fall Restrictions Weight Bearing Restrictions: No      Mobility  Bed Mobility Overal bed mobility: Needs Assistance Bed Mobility: Supine to Sit     Supine to sit: Min assist     General bed mobility comments: assist for bed mobility including trunk support and moving B LE off bed. Once seated, able to sit with supervision. Only able to sit for approx 3-4 mins prior to fatigue and request to return back to supine position.  Transfers                 General transfer comment:  refuses to attempt at this time  Ambulation/Gait                Stairs            Wheelchair Mobility    Modified Rankin (Stroke Patients Only)       Balance Overall balance assessment: History of Falls;Needs assistance Sitting-balance support: Feet unsupported;Bilateral upper extremity supported Sitting balance-Leahy Scale: Fair                                       Pertinent Vitals/Pain Pain Assessment: Faces Faces Pain Scale: Hurts little more Pain Location: low back Pain Descriptors / Indicators: Aching Pain Intervention(s): Limited activity within patient's tolerance    Home Living Family/patient expects to be discharged to:: Private residence Living Arrangements: Alone Available Help at Discharge: Family;Available PRN/intermittently Type of Home: House Home Access: Stairs to enter Entrance Stairs-Rails: Left Entrance Stairs-Number of Steps: 5 Home Layout: One level Home Equipment: Walker - 4 wheels;Bedside commode;Shower seat (lift chair)      Prior Function Level of Independence: Independent with assistive device(s)         Comments: has supportive family near by. Reports 1 fall in April 2017. Was receiving HHPT prior to admission     Hand Dominance        Extremity/Trunk Assessment   Upper Extremity Assessment: Generalized weakness (B UE grossly 3/5)  Lower Extremity Assessment: Generalized weakness (B LE grossly 4/5)         Communication   Communication: HOH  Cognition Arousal/Alertness: Lethargic Behavior During Therapy: WFL for tasks assessed/performed Overall Cognitive Status: Within Functional Limits for tasks assessed                      General Comments      Exercises Other Exercises Other Exercises: Seated ther-ex performed including B LE LAQ, hip add squeezes, and B UE elbow flexion/extension. All ther-ex performed x 10 reps with cga and cues for correct technique. Pt fatigues  with minimal exertion and requests to return back supine.   Assessment/Plan    PT Assessment Patient needs continued PT services  PT Problem List Decreased strength;Decreased balance;Decreased mobility          PT Treatment Interventions Gait training;Therapeutic exercise    PT Goals (Current goals can be found in the Care Plan section)  Acute Rehab PT Goals Patient Stated Goal: to go back home PT Goal Formulation: With patient Time For Goal Achievement: 10/26/16 Potential to Achieve Goals: Good    Frequency Min 2X/week   Barriers to discharge Decreased caregiver support      Co-evaluation               End of Session Equipment Utilized During Treatment: Oxygen Activity Tolerance: Patient tolerated treatment well Patient left: in bed;with bed alarm set Nurse Communication: Mobility status         Time: 3142-7670 PT Time Calculation (min) (ACUTE ONLY): 18 min   Charges:   PT Evaluation $PT Eval Moderate Complexity: 1 Procedure PT Treatments $Therapeutic Exercise: 8-22 mins   PT G Codes:        Dreshawn Hendershott 10/15/16, 11:47 AM  Greggory Stallion, PT, DPT [phone]

## 2016-10-12 NOTE — Consult Note (Signed)
   [facility] CM Inpatient Consult   10/12/2016  [place] [birth year] [phone]  Patient is currently active with [facility] for chronic disease management services.  Patient has been engaged by a [employer] and CSW.  Our community based plan of care has focused on disease management and community resource support.  Patient will receive a post discharge transition of care call and will be evaluated for monthly home visits for assessments and disease process education. Of note, [facility] Care Management services does not replace or interfere with any services that are needed or arranged by inpatient case management or social work.  For additional questions or referrals please contact:  Natane Heward RN, [facility] Liaison  [phone]) [place] [phone]) Toll free office

## 2016-10-13 MED ORDER — DILTIAZEM HCL 120 MG PO TABS: 180.0000 mg | ORAL_TABLET | Freq: Every day | ORAL | 0 refills | Status: AC

## 2016-10-13 MED ORDER — DILTIAZEM HCL ER COATED BEADS 180 MG PO CP24
180.0000 mg | ORAL_CAPSULE | Freq: Every day | ORAL | Status: DC
Start: 2016-10-14 — End: 2016-10-13

## 2016-10-13 MED ORDER — IPRATROPIUM-ALBUTEROL 0.5-2.5 (3) MG/3ML IN SOLN
3.0000 mL | Freq: Four times a day (QID) | RESPIRATORY_TRACT | Status: DC
  Administered 2016-10-13 (×2): 3 mL via RESPIRATORY_TRACT
  Filled 2016-10-13 (×2): qty 3

## 2016-10-13 MED ORDER — FUROSEMIDE 20 MG PO TABS: 20.0000 mg | ORAL_TABLET | ORAL | 0 refills | Status: AC

## 2016-10-13 MED ORDER — FUROSEMIDE 20 MG PO TABS: 20.0000 mg | ORAL_TABLET | Freq: Every day | ORAL | 1 refills | Status: DC

## 2016-10-13 MED ORDER — ENSURE ENLIVE PO LIQD: 237.0000 mL | Freq: Two times a day (BID) | ORAL | 12 refills | Status: AC

## 2016-10-13 MED ORDER — DIGOXIN 125 MCG PO TABS: 0.1250 mg | ORAL_TABLET | Freq: Every day | ORAL | 1 refills | Status: AC

## 2016-10-13 MED ORDER — LEVOFLOXACIN 250 MG PO TABS: 250.0000 mg | ORAL_TABLET | Freq: Every day | ORAL | 0 refills | Status: DC

## 2016-10-13 NOTE — Care Management Important Message (Signed)
Important Message  Patient Details  Name: Sara Mccoy MRN: [id number] Date of Birth: 1926-11-12   Medicare Important Message Given:  Yes    Katrina Stack, RN 10/13/2016, 4:39 PM

## 2016-10-13 NOTE — NC FL2 (Signed)
[place] LEVEL OF CARE SCREENING TOOL     IDENTIFICATION  Patient Name: Sara Mccoy [facility] Birthdate: 1926-11-16 Sex: female Admission Date (Current Location): 10/09/2016  [place] and [place] Number:  Engineering geologist and Address:  [facility], [address], [place], [place] [address]      Provider Number: [id number]  Attending Physician Name and Address:  Fritzi Mandes, MD  Relative Name and Phone Number:  Tonny Bollman Daughter [phone]  [phone] or Zebedee Iba [phone]     Current Level of Care: Hospital Recommended Level of Care: [place] Prior Approval Number:    Date Approved/Denied:   PASRR Number: [id number]  Discharge Plan: SNF    Current Diagnoses: Patient Active Problem List   Diagnosis Date Noted  . Atrial fibrillation with RVR ([place]) 10/09/2016  . Leukocytosis 10/09/2016  . COPD (chronic obstructive pulmonary disease) ([place]) 10/09/2016  . GERD (gastroesophageal reflux disease) 10/09/2016  . Anxiety 10/09/2016  . Protein-calorie malnutrition, severe 09/08/2016  . Rectal prolapse 09/05/2016  . Acute respiratory failure ([place]) 07/22/2016  . Symptomatic anemia 07/21/2016  . GI bleed 04/25/2016  . Abdominal mass   . Absolute anemia   . Blood in stool   . Diverticulosis of large intestine without diverticulitis     Orientation RESPIRATION BLADDER Height & Weight     Self  O2 (4L of oxygen) Continent Weight: 145 lb 12.8 oz (66.1 kg) Height:  '5\' 1"'$  (154.9 cm)  BEHAVIORAL SYMPTOMS/MOOD NEUROLOGICAL BOWEL NUTRITION STATUS      Continent Diet (Dysphagia 3)  AMBULATORY STATUS COMMUNICATION OF NEEDS Skin   Limited Assist Verbally Normal                       Personal Care Assistance Level of Assistance  Bathing, Feeding, Dressing Bathing Assistance: Limited assistance Feeding assistance: Limited assistance Dressing Assistance: Limited assistance     Functional  Limitations Info  Sight, Hearing, Speech Sight Info: Adequate Hearing Info: Adequate Speech Info: Adequate    SPECIAL CARE FACTORS FREQUENCY  PT (By licensed PT)     PT Frequency: 5x a week              Contractures Contractures Info: Not present    Additional Factors Info  Code Status, Allergies, Psychotropic Code Status Info: DNR Allergies Info: NKA Psychotropic Info: ALPRAZolam (XANAX) tablet 0.5 mg         Current Medications (10/13/2016):  This is the current hospital active medication list Current Facility-Administered Medications  Medication Dose Route Frequency Provider Last Rate Last Dose  . acetaminophen (TYLENOL) tablet 650 mg  650 mg Oral Q6H PRN Lance Coon, MD       Or  . acetaminophen (TYLENOL) suppository 650 mg  650 mg Rectal Q6H PRN Lance Coon, MD      . albuterol (PROVENTIL) (2.5 MG/3ML) 0.083% nebulizer solution 2.5 mg  2.5 mg Inhalation Q6H PRN Fritzi Mandes, MD      . ALPRAZolam Duanne Moron) tablet 0.5 mg  0.5 mg Oral QHS Lance Coon, MD   0.5 mg at 10/12/16 2106  . calcium-vitamin D (OSCAL WITH D) 500-200 MG-UNIT per tablet 1 tablet  1 tablet Oral Daily Fritzi Mandes, MD   1 tablet at 10/12/16 2106  . cholecalciferol (VITAMIN D) tablet 1,000 Units  1,000 Units Oral Daily Fritzi Mandes, MD   1,000 Units at 10/12/16 2107  . digoxin (LANOXIN) tablet 0.125 mg  0.125 mg Oral Daily Teodoro Spray, MD  0.125 mg at 10/12/16 0950  . diltiazem (CARDIZEM) tablet 60 mg  60 mg Oral Q8H Lance Coon, MD   60 mg at 10/13/16 0524  . enoxaparin (LOVENOX) injection 40 mg  40 mg Subcutaneous Q24H Lance Coon, MD   40 mg at 10/12/16 2105  . feeding supplement (ENSURE ENLIVE) (ENSURE ENLIVE) liquid 237 mL  237 mL Oral BID BM Sital Mody, MD      . ferrous sulfate tablet 325 mg  325 mg Oral Q breakfast Fritzi Mandes, MD   325 mg at 10/13/16 0834  . furosemide (LASIX) tablet 20 mg  20 mg Oral Daily Fritzi Mandes, MD   20 mg at 10/12/16 2107  . ipratropium-albuterol (DUONEB) 0.5-2.5 (3)  MG/3ML nebulizer solution 3 mL  3 mL Nebulization Q6H PRN Fritzi Mandes, MD      . ipratropium-albuterol (DUONEB) 0.5-2.5 (3) MG/3ML nebulizer solution 3 mL  3 mL Nebulization Q6H Fritzi Mandes, MD   3 mL at 10/13/16 0725  . levofloxacin (LEVAQUIN) tablet 250 mg  250 mg Oral Daily Fritzi Mandes, MD   250 mg at 10/12/16 1457  . MEDLINE mouth rinse  15 mL Mouth Rinse BID Lance Coon, MD   15 mL at 10/12/16 2200  . mometasone-formoterol (DULERA) 200-5 MCG/ACT inhaler 2 puff  2 puff Inhalation BID Lance Coon, MD   2 puff at 10/13/16 0835  . multivitamin with minerals tablet 1 tablet  1 tablet Oral Daily Fritzi Mandes, MD   1 tablet at 10/12/16 2107  . ondansetron (ZOFRAN) tablet 4 mg  4 mg Oral Q6H PRN Lance Coon, MD       Or  . ondansetron [facility]) injection 4 mg  4 mg Intravenous Q6H PRN Lance Coon, MD      . pantoprazole (PROTONIX) EC tablet 40 mg  40 mg Oral Daily Lance Coon, MD   40 mg at 10/12/16 0950  . pramipexole (MIRAPEX) tablet 0.5 mg  0.5 mg Oral QHS Lance Coon, MD   0.5 mg at 10/12/16 2106  . predniSONE (DELTASONE) tablet 50 mg  50 mg Oral Q breakfast Fritzi Mandes, MD   50 mg at 10/13/16 0834  . sodium chloride flush (NS) 0.9 % injection 3 mL  3 mL Intravenous Q12H Lance Coon, MD   3 mL at 10/12/16 2105  . vitamin E capsule 400 Units  400 Units Oral Daily Fritzi Mandes, MD   400 Units at 10/12/16 2106     Discharge Medications: Please see discharge summary for a list of discharge medications.  Relevant Imaging Results:  Relevant Lab Results:   Additional Information SS: [id number]  Ross Ludwig

## 2016-10-13 NOTE — Progress Notes (Signed)
Physical Therapy Treatment Patient Details Name: Sara Mccoy MRN: [id number] DOB: [birth year] Today's Date: 10/13/2016    History of Present Illness Pt admitted with tachcardia and shortness of breath symptoms. Pt is now admitted with Afib with RVR.  She is now s/p rectal prolapse surgery on 09/09/16.  Pt recently admitted to hospital in 07/2016 due to onset of anemia and respiratory failure.  From there went to [facility] where she stayed until ~9/28 pre pt report. Pt's PMH includes prolapse of intestine, lung cancer (upper lobe), COPD, anemia.     PT Comments    Pt reluctant initially to participate with therapy, but with encouragement agreeable to up in chair. Pt requires increased time for all tasks and Min A for bed mobility and transfers especially from bedside commode. Cues both verbal and tactile for safe hand placement. Pt ambulates from bed to bedside commode (3 ft) and later from bedside commode to recliner (5 ft). Pt able to do so with Min guard and directional cues. Pt up in chair comfortably and pleased; pt notes fatigue and moderate shortness of breath. O2 saturation remains above 90% on 3 liters supplemental O2. Continue PT to progress strength and endurance to improve functional mobility.   Follow Up Recommendations  SNF     Equipment Recommendations       Recommendations for Other Services       Precautions / Restrictions Precautions Precautions: Fall Restrictions Weight Bearing Restrictions: No    Mobility  Bed Mobility Overal bed mobility: Needs Assistance Bed Mobility: Supine to Sit     Supine to sit: Min assist     General bed mobility comments: Assist for trunk elevation  Transfers Overall transfer level: Needs assistance Equipment used: Rolling walker (2 wheeled) Transfers: Sit to/from Stand Sit to Stand: Min assist         General transfer comment: Cues for hand placement; a little more assist and several attempts to get up  from [facility]  Ambulation/Gait Ambulation/Gait assistance: Min guard Ambulation Distance (Feet): 3 Feet (later 5 ft from [facility] to recliner) Assistive device: Rolling walker (2 wheeled) Gait Pattern/deviations: Step-to pattern;Trunk flexed Gait velocity: slow Gait velocity interpretation: <1.8 ft/sec, indicative of risk for recurrent falls General Gait Details: Small steps with little clearance from floor bed to [facility] then to recliner; no LOB   Stairs            Wheelchair Mobility    Modified Rankin (Stroke Patients Only)       Balance Overall balance assessment: History of Falls;Needs assistance Sitting-balance support: Feet supported Sitting balance-Leahy Scale: Fair     Standing balance support: Bilateral upper extremity supported Standing balance-Leahy Scale: Fair                      Cognition Arousal/Alertness: Awake/alert Behavior During Therapy: WFL for tasks assessed/performed Overall Cognitive Status: Within Functional Limits for tasks assessed                      Exercises      General Comments        Pertinent Vitals/Pain Pain Assessment: No/denies pain    Home Living                      Prior Function            PT Goals (current goals can now be found in the care plan section) Progress towards PT goals: Progressing  toward goals    Frequency    Min 2X/week      PT Plan Current plan remains appropriate    Co-evaluation             End of Session Equipment Utilized During Treatment: Gait belt;Oxygen Activity Tolerance: Patient tolerated treatment well Patient left: in chair;with call bell/phone within reach;with chair alarm set     Time: 2929-0903 PT Time Calculation (min) (ACUTE ONLY): 30 min  Charges:  $Gait Training: 8-22 mins $Therapeutic Activity: 8-22 mins                    G CodesLarae Grooms, PTA 10/13/2016, 3:23 PM

## 2016-10-13 NOTE — Progress Notes (Signed)
[place] [facility]  SUBJECTIVE: sob   Vitals:   10/12/16 1211 10/12/16 2007 10/12/16 2145 10/13/16 0451  BP: (!) 107/37 110/61  (!) 107/43  Pulse: 81 98 96 89  Resp: '19 18  18  '$ Temp: 97.6 F (36.4 C) 97.6 F (36.4 C)  97.7 F (36.5 C)  TempSrc:  Oral  Oral  SpO2: 99% 90% 93% 100%  Weight:      Height:        Intake/Output Summary (Last 24 hours) at 10/13/16 0753 Last data filed at 10/13/16 9937  Gross per 24 hour  Intake              480 ml  Output                0 ml  Net              480 ml    LABS: Basic Metabolic Panel:  Recent Labs  10/12/16 0653  NA 141  K 4.3  CL 97*  CO2 38*  GLUCOSE 159*  BUN 42*  CREATININE 0.99  CALCIUM 8.6*   Liver Function Tests: No results for input(s): AST, ALT, ALKPHOS, BILITOT, PROT, ALBUMIN in the last 72 hours. No results for input(s): LIPASE, AMYLASE in the last 72 hours. CBC:  Recent Labs  10/10/16 1634 10/12/16 0653  WBC  --  11.1*  HGB 8.4* 8.6*  HCT 25.4* 25.7*  MCV  --  100.8*  PLT  --  117*   Cardiac Enzymes:  Recent Labs  10/10/16 1146  TROPONINI 0.03*   BNP: Invalid input(s): POCBNP D-Dimer: No results for input(s): DDIMER in the last 72 hours. Hemoglobin A1C: No results for input(s): HGBA1C in the last 72 hours. Fasting Lipid Panel: No results for input(s): CHOL, HDL, LDLCALC, TRIG, CHOLHDL, LDLDIRECT in the last 72 hours. Thyroid Function Tests:  Recent Labs  10/10/16 1146  TSH 3.050   Anemia Panel:  Recent Labs  10/10/16 1146  VITAMINB12 3,028*  FOLATE 13.1  FERRITIN 1,330*  TIBC 165*  IRON 20*  RETICCTPCT 1.9     Physical Exam: Blood pressure (!) 107/43, pulse 89, temperature 97.7 F (36.5 C), temperature source Oral, resp. rate 18, height '5\' 1"'$  (1.549 m), weight 66.1 kg (145 lb 12.8 oz), SpO2 100 %.   Wt Readings from Last 1 Encounters:  10/09/16 66.1 kg (145 lb 12.8 oz)     General appearance: cooperative Resp: rhonchi  bilaterally Cardio: irregularly irregular rhythm Extremities: extremities normal, atraumatic, no cyanosis or edema Neurologic: Grossly normal  TELEMETRY: Reviewed telemetry pt in afib with variable but improved vr:  ASSESSMENT AND PLAN:  Principal Problem:   Atrial fibrillation with RVR (HCC)-rate improved with diltiazem at 60 q 8. Will convert to 180 cd at discharge aw well as digoxin at 0.125 mg . Will continue with current doses. Not a candidate for chronic anticoagulation due to recent hgb drop post rectal surgery.   CHF-more sob yesterday. Improved somewhat with lasix times 1 . Plus 1 liter . Continue to follow.   . Active Problems:   Symptomatic anemia-hgb stable at 8.6 with no further bleeding.    Leukocytosis   COPD (chronic obstructive pulmonary disease) (HCC)   GERD (gastroesophageal reflux disease)   Anxiety    Teodoro Spray, MD, [facility] 10/13/2016 7:53 AM

## 2016-10-13 NOTE — Discharge Instructions (Signed)
Use your oxygen as before

## 2016-10-13 NOTE — Clinical Social Work Note (Signed)
MSW attempted to contact patient's daughter [name] to discuss SNF placement process and options.  MSW left message awaiting call back from patient's daughter.  MSW to work up for SNF placement, per PT recommendation.  Jones Broom. Marketia Stallsmith, MSW [phone]  Mon-Fri 8a-4:30p 10/13/2016 9:17 AM

## 2016-10-13 NOTE — Progress Notes (Signed)
Checked in with Sara Mccoy, [name]. He is waiting on insurance authorization/paperwork for discharge. Patient resting quietly.

## 2016-10-13 NOTE — Clinical Social Work Placement (Signed)
   CLINICAL SOCIAL WORK PLACEMENT  NOTE  Date:  10/13/2016  Patient Details  Name: Sara Mccoy MRN: [id number] Date of Birth: [birth year]  Clinical Social Work is seeking post-discharge placement for this patient at the [place] level of care (*CSW will initial, date and re-position this form in  chart as items are completed):  Yes   Patient/family provided with [place] Work Department's list of facilities offering this level of care within the geographic area requested by the patient (or if unable, by the patient's family).  Yes   Patient/family informed of their freedom to choose among providers that offer the needed level of care, that participate in Medicare, Medicaid or managed care program needed by the patient, have an available bed and are willing to accept the patient.  Yes   Patient/family informed of [facility]'s ownership interest in [facility] and [facility], as well as of the fact that they are under no obligation to receive care at these facilities.  PASRR submitted to EDS on 10/13/16     PASRR number received on       Existing PASRR number confirmed on 10/13/16     FL2 transmitted to all facilities in geographic area requested by pt/family on 10/13/16     FL2 transmitted to all facilities within larger geographic area on       Patient informed that his/her managed care company has contracts with or will negotiate with certain facilities, including the following:        Yes   Patient/family informed of bed offers received.  Patient chooses bed at [facility]     Physician recommends and patient chooses bed at      Patient to be transferred to [facility] on 10/13/16.  Patient to be transferred to facility by [facility]     Patient family notified on 10/13/16 of transfer.  Name of family member notified:        PHYSICIAN Please sign DNR, Please sign FL2      Additional Comment:    _______________________________________________ Ross Ludwig 10/13/2016, 5:01 PM

## 2016-10-13 NOTE — Clinical Social Work Note (Signed)
Clinical Social Work Assessment  Patient Details  Name: Sara Mccoy MRN: [id number] Date of Birth: [birth year]  Date of referral:  10/13/16               Reason for consult:  Facility Placement                Permission sought to share information with:  Facility Sport and exercise psychologist, Family Supports Permission granted to share information::  Yes, Verbal Permission Granted  Name::     Tonny Bollman Daughter [phone]  [phone]   Agency::  SNF admissions  Relationship::     Contact Information:     Housing/Transportation Living arrangements for the past 2 months:  [place] of Information:  Patient, Adult Children Patient Interpreter Needed:  None Criminal Activity/Legal Involvement Pertinent to Current Situation/Hospitalization:  No - Comment as needed Significant Relationships:    Lives with:  Self Do you feel safe going back to the place where you live?  No Need for family participation in patient care:  Yes (Comment)  Care giving concerns:  Patient and family feel she needs some short term rehab before she can return back home.   Social Worker assessment / plan:  Patient is an 82 year old female who is alert and oriented x4 and able to express her feelings.  Patient states she has been to rehab three times in the past year and she has gone to [facility].  Patient stated she is pleased with the care that is provided, and would like to return back to SNF before she is able to go home.  Patient expressed that she is familiar with the process for SNF placement and knows what to expect at SNF.  Patient and family did not express any other concerns or issues.  MSW was given permission to begin bed search in [facility].  Employment status:  Retired Nurse, adult PT Recommendations:  [place] / Referral to community resources:  [place]  Patient/Family's Response to care:   Patient and family are agreeable to going to SNF for short term rehab.  Patient/Family's Understanding of and Emotional Response to Diagnosis, Current Treatment, and Prognosis:  Patient expressed that she is frustrated that she keeps getting sick, patient states she does not know what keeps bringing her back.  Patient expressed that she is hopeful that she does not have to be at [facility] for a long period of time.  Patient is hopeful that she can get her strength back to return back home  Emotional Assessment Appearance:  Appears stated age Attitude/Demeanor/Rapport:    Affect (typically observed):  Appropriate, Calm, Stable, Pleasant Orientation:  Oriented to Self, Oriented to Place, Oriented to  Time, Oriented to Situation Alcohol / Substance use:  Not Applicable Psych involvement (Current and /or in the community):  No (Comment)  Discharge Needs  Concerns to be addressed:  Lack of Support Readmission within the last 30 days:  No Current discharge risk:  Lack of support system Barriers to Discharge:  No Barriers Identified   Ross Ludwig 10/13/2016, 4:51 PM

## 2016-10-13 NOTE — Progress Notes (Signed)
Discharge packet prepared cy Randall Hiss, SW. He has already updated family who is now at bedside. Report called to Bethena Roys and [facility]. EMS called for transport. IV and telemetry removed prior to leaving. No patient complaints or questions at this time.

## 2016-10-13 NOTE — Discharge Summary (Signed)
[facility] [place] NAME: Sara Mccoy    MR#:  [id number]  DATE OF BIRTH:  1926-01-12  DATE OF ADMISSION:  10/09/2016 ADMITTING PHYSICIAN: Lance Coon, MD  DATE OF DISCHARGE: 10/13/16  PRIMARY CARE PHYSICIAN: Tracie Harrier, MD    ADMISSION DIAGNOSIS:  Atrial fibrillation, unspecified type ([place]) [I48.91] Dyspnea, unspecified type [R06.00]  DISCHARGE DIAGNOSIS:  Acute on chronic Afib with RVR Acute on chronic respiraotry faiure with pneumonia and acute on chronic systolic CHF  SECONDARY DIAGNOSIS:   Past Medical History:  Diagnosis Date  . Anemia   . Anxiety   . COPD (chronic obstructive pulmonary disease) ([place])   . GERD (gastroesophageal reflux disease)   . Lung cancer, upper lobe ([place]) 1997   right upper lobe  . Prolapse of intestine   . Raynaud's disease     HOSPITAL COURSE:  82 year old female with history of COPD and right upper lobe cancer who presents with atrial fibrillation.  1. Atrial fibrillation With RVR: Continue diltiazem for heart rate control  Echocardiogram shows ejection fraction of 45-50% with mild pulmonary hypertension. -HR in the 80's  2. Shortness of breath : X-ray today shows left lobe pneumonia versus aspiration Was on IV Zosyn ---changed to po levaquin -Patient also with ejection fraction of 45% and received 1 unit of PRBCs and therefore may have a component of mild pulmonary edema. One-time dose of IV Lasix  3. Elevated troponins: This is due to demand ischemia and not ACS.  4. History of COPD : Start steroids in light of patient complaining of shortness of breath DuoNeb's and inhalers 5 GERD: Continue PPI  6. Acute on chronic Anemia: She has a history of chronic macrocytic anemia and a monoclonal gammopathy and evaluation at the cancer clinic. There is concern about possible myelodysplastic syndrome. Bone marrow biopsy has been discussed but patient was preferring not to have  biopsy.  Status post 1 unit PRBC Hemoglobin 8.4 stable  7. Recent rectal prolapse surgery  8. Acute on chronic systolic heart failure: One-time dose of IV Lasix given.  -will place her on lasix 20 mg daily for next few days  Overall improving slowly PT recommends Rehab D/w amy grand-dter.  D/c to rehab today csw aware  CONSULTS OBTAINED:  Treatment Team:  Teodoro Spray, MD  DRUG ALLERGIES:  No Known Allergies  DISCHARGE MEDICATIONS:   Current Discharge Medication List    START taking these medications   Details  digoxin (LANOXIN) 0.125 MG tablet Take 1 tablet (0.125 mg total) by mouth daily. Qty: 30 tablet, Refills: 1    feeding supplement, ENSURE ENLIVE, (ENSURE ENLIVE) LIQD Take 237 mLs by mouth 2 (two) times daily between meals. Qty: 237 mL, Refills: 12    furosemide (LASIX) 20 MG tablet Take 1 tablet (20 mg total) by mouth every other day. Qty: 30 tablet, Refills: 0    levofloxacin (LEVAQUIN) 250 MG tablet Take 1 tablet (250 mg total) by mouth daily. Qty: 4 tablet, Refills: 0      CONTINUE these medications which have CHANGED   Details  diltiazem (CARDIZEM) 120 MG tablet Take 1.5 tablets (180 mg total) by mouth daily. Qty: 30 tablet, Refills: 0      CONTINUE these medications which have NOT CHANGED   Details  albuterol (PROVENTIL HFA;VENTOLIN HFA) 108 (90 Base) MCG/ACT inhaler Inhale 2 puffs into the lungs every 6 (six) hours as needed for wheezing or shortness of breath.     ALPRAZolam Duanne Moron)  0.5 MG tablet Take 1 tablet (0.5 mg total) by mouth at bedtime as needed for anxiety. Qty: 10 tablet, Refills: 0    Ascorbic Acid (VITAMIN C) 1000 MG tablet Take 1,000 mg by mouth daily.    b complex vitamins capsule Take 1 capsule by mouth daily. Reported on 06/08/2016    Calcium 600-200 MG-UNIT tablet Take 1 tablet by mouth daily.    cholecalciferol (VITAMIN D) 1000 units tablet Take 1,000 Units by mouth daily.    Cyanocobalamin (VITAMIN B-12) 5000 MCG SUBL  Place 1 tablet (5,000 mcg total) under the tongue daily. Daily Qty: 30 tablet, Refills: 0    ferrous sulfate 325 (65 FE) MG tablet Take 1 tablet (325 mg total) by mouth daily with breakfast. Qty: 30 tablet, Refills: 0    Fluticasone-Salmeterol (ADVAIR) 500-50 MCG/DOSE AEPB Inhale 1 puff into the lungs 2 (two) times daily.    ipratropium-albuterol (DUONEB) 0.5-2.5 (3) MG/3ML SOLN Take 3 mLs by nebulization every 6 (six) hours as needed. For wheezing/shortness of breath    Multiple Vitamin (MULTIVITAMIN WITH MINERALS) TABS tablet Take 1 tablet by mouth daily. Reported on 06/08/2016    omeprazole (PRILOSEC) 40 MG capsule Take 40 mg by mouth daily.    oxyCODONE-acetaminophen (PERCOCET/ROXICET) 5-325 MG tablet Take 1 tablet by mouth 2 (two) times daily as needed for severe pain. Qty: 10 tablet, Refills: 0    pramipexole (MIRAPEX) 0.5 MG tablet Take 0.5 mg by mouth at bedtime.     vitamin E 400 UNIT capsule Take 400 Units by mouth daily.        If you experience worsening of your admission symptoms, develop shortness of breath, life threatening emergency, suicidal or homicidal thoughts you must seek medical attention immediately by calling 911 or calling your MD immediately  if symptoms less severe.  You Must read complete instructions/literature along with all the possible adverse reactions/side effects for all the Medicines you take and that have been prescribed to you. Take any new Medicines after you have completely understood and accept all the possible adverse reactions/side effects.   Please note  You were cared for by a hospitalist during your hospital stay. If you have any questions about your discharge medications or the care you received while you were in the hospital after you are discharged, you can call the unit and asked to speak with the hospitalist on call if the hospitalist that took care of you is not available. Once you are discharged, your primary care physician will handle  any further medical issues. Please note that NO REFILLS for any discharge medications will be authorized once you are discharged, as it is imperative that you return to your primary care physician (or establish a relationship with a primary care physician if you do not have one) for your aftercare needs so that they can reassess your need for medications and monitor your lab values. Today   SUBJECTIVE   Feels btter today  VITAL SIGNS:  Blood pressure 106/62, pulse 62, temperature 97.7 F (36.5 C), temperature source Oral, resp. rate 18, height 5' 1"  (1.549 m), weight 66.1 kg (145 lb 12.8 oz), SpO2 90 %.  I/O:    Intake/Output Summary (Last 24 hours) at 10/13/16 1147 Last data filed at 10/13/16 1100  Gross per 24 hour  Intake              720 ml  Output              500 ml  Net  220 ml    PHYSICAL EXAMINATION:  GENERAL:  82 y.o.-year-old patient lying in the bed with no acute distress.  EYES: Pupils equal, round, reactive to light and accommodation. No scleral icterus. Extraocular muscles intact.  HEENT: Head atraumatic, normocephalic. Oropharynx and nasopharynx clear.  NECK:  Supple, no jugular venous distention. No thyroid enlargement, no tenderness.  LUNGS: Normal breath sounds bilaterally, no wheezing, rales,rhonchi o + chronic crepitation. No use of accessory muscles of respiration.  CARDIOVASCULAR: S1, S2 normal. No murmurs, rubs, or gallops.  ABDOMEN: Soft, non-tender, non-distended. Bowel sounds present. No organomegaly or mass.  EXTREMITIES: No pedal edema, cyanosis, or clubbing.  NEUROLOGIC: Cranial nerves II through XII are intact. Muscle strength 5/5 in all extremities. Sensation intact. Gait not checked.  PSYCHIATRIC: The patient is alert and oriented x 3.  SKIN: No obvious rash, lesion, or ulcer.   DATA REVIEW:   CBC   Recent Labs Lab 10/12/16 0653  WBC 11.1*  HGB 8.6*  HCT 25.7*  PLT 117*    Chemistries   Recent Labs Lab 10/12/16 0653   NA 141  K 4.3  CL 97*  CO2 38*  GLUCOSE 159*  BUN 42*  CREATININE 0.99  CALCIUM 8.6*    Microbiology Results   No results found for this or any previous visit (from the past 240 hour(s)).  RADIOLOGY:  Dg Chest 1 View  Result Date: 10/12/2016 CLINICAL DATA:  Hypoxia on BiPAP machine. Previous right upper lobectomy 1997. EXAM: CHEST 1 VIEW COMPARISON:  10/11/2016 FINDINGS: Lungs are adequately inflated and demonstrate continued bibasilar opacification suggesting bilateral effusions with atelectasis, although cannot exclude infection in the lung bases. Moderate stable cardiomegaly. Calcified plaque over the thoracic aorta. Degenerative change of the spine. Post thoracotomy changes of the right chest wall. IMPRESSION: Persistent hazy bibasilar opacification likely effusions with atelectasis, although cannot exclude infection. Stable moderate cardiomegaly. Electronically Signed   By: Marin Olp M.D.   On: 10/12/2016 07:35     Management plans discussed with the patient, family and they are in agreement.  CODE STATUS:     Code Status Orders        Start     Ordered   10/09/16 2204  Do not attempt resuscitation (DNR)  Continuous    Question Answer Comment  In the event of cardiac or respiratory ARREST Do not call a "code blue"   In the event of cardiac or respiratory ARREST Do not perform Intubation, CPR, defibrillation or ACLS   In the event of cardiac or respiratory ARREST Use medication by any route, position, wound care, and other measures to relive pain and suffering. May use oxygen, suction and manual treatment of airway obstruction as needed for comfort.      10/09/16 2203    Code Status History    Date Active Date Inactive Code Status Order ID Comments User Context   09/07/2016  4:48 PM 09/11/2016  8:34 PM DNR [id number]  Rowe Clack, MD Inpatient   09/06/2016  2:05 AM 09/06/2016  1:01 PM DNR [id number]  [name], DO Inpatient   09/06/2016  1:40 AM 09/06/2016   2:05 AM Full Code [id number]  [name], DO Inpatient   09/06/2016  1:40 AM 09/06/2016  1:40 AM DNR [id number]  [name], DO Inpatient   07/25/2016  8:20 AM 07/27/2016  5:29 PM Full Code [id number]  [name], MD Inpatient   07/22/2016 10:49 AM 07/24/2016  7:04 PM DNR [id number]  [name], MD Inpatient   07/21/2016  10:33 PM 07/22/2016 10:48 AM Full Code [id number]  [name], DO ED   07/21/2016 10:33 PM 07/21/2016 10:33 PM DNR [id number]  [name], DO ED   04/25/2016  3:51 AM 04/26/2016  7:38 PM DNR [id number]  [name], MD Inpatient   04/25/2016  3:29 AM 04/25/2016  3:29 AM Full Code [id number]  [name], MD ED    Advance Directive Documentation   Flowsheet Row Most Recent Value  Type of Advance Directive  Living will, Healthcare Power of Attorney  Pre-existing out of facility DNR order (yellow form or pink MOST form)  No data  "MOST" Form in Place?  No data      TOTAL TIME TAKING CARE OF THIS PATIENT: 40 minutes.    Kirra Verga M.D on 10/13/2016 at 11:47 AM  Between 7am to 6pm - Pager - [phone] After 6pm go to [email or web address] - password [email or web address] [facility]  Office  [phone]  CC: Primary care physician; Tracie Harrier, MD

## 2016-10-19 ENCOUNTER — Other Ambulatory Visit: Payer: Self-pay | Admitting: *Deleted

## 2016-10-19 NOTE — Patient Outreach (Signed)
[name]) Care Management  10/19/2016  Sara Mccoy [facility] Dec 20, 1925 [id number]   Subjective: Patient is a 82 year old female, currently in rehab at [facility].      Medication List       Accurate as of 10/19/16  4:38 PM. Always use your most recent med list.          albuterol 108 (90 Base) MCG/ACT inhaler Commonly known as:  PROVENTIL HFA;VENTOLIN HFA Inhale 2 puffs into the lungs every 6 (six) hours as needed for wheezing or shortness of breath.   ALPRAZolam 0.5 MG tablet Commonly known as:  XANAX Take 1 tablet (0.5 mg total) by mouth at bedtime as needed for anxiety.   b complex vitamins capsule Take 1 capsule by mouth daily. Reported on 06/08/2016   Calcium 600-200 MG-UNIT tablet Take 1 tablet by mouth daily.   cholecalciferol 1000 units tablet Commonly known as:  VITAMIN D Take 1,000 Units by mouth daily.   digoxin 0.125 MG tablet Commonly known as:  LANOXIN Take 1 tablet (0.125 mg total) by mouth daily.   diltiazem 120 MG tablet Commonly known as:  CARDIZEM Take 1.5 tablets (180 mg total) by mouth daily.   feeding supplement (ENSURE ENLIVE) Liqd Take 237 mLs by mouth 2 (two) times daily between meals.   ferrous sulfate 325 (65 FE) MG tablet Take 1 tablet (325 mg total) by mouth daily with breakfast.   Fluticasone-Salmeterol 500-50 MCG/DOSE Aepb Commonly known as:  ADVAIR Inhale 1 puff into the lungs 2 (two) times daily.   furosemide 20 MG tablet Commonly known as:  LASIX Take 1 tablet (20 mg total) by mouth every other day.   ipratropium-albuterol 0.5-2.5 (3) MG/3ML Soln Commonly known as:  DUONEB Take 3 mLs by nebulization every 6 (six) hours as needed. For wheezing/shortness of breath   levofloxacin 250 MG tablet Commonly known as:  LEVAQUIN Take 1 tablet (250 mg total) by mouth daily.   multivitamin with minerals Tabs tablet Take 1 tablet by mouth daily. Reported on 06/08/2016   omeprazole 40 MG  capsule Commonly known as:  PRILOSEC Take 40 mg by mouth daily.   oxyCODONE-acetaminophen 5-325 MG tablet Commonly known as:  PERCOCET/ROXICET Take 1 tablet by mouth 2 (two) times daily as needed for severe pain.   pramipexole 0.5 MG tablet Commonly known as:  MIRAPEX Take 0.5 mg by mouth at bedtime.   Vitamin B-12 5000 MCG Subl Place 1 tablet (5,000 mcg total) under the tongue daily. Daily   vitamin C 1000 MG tablet Take 1,000 mg by mouth daily.   vitamin E 400 UNIT capsule Take 400 Units by mouth daily.      Fall Risk  10/09/2016 06/08/2016  Falls in the past year? Yes Yes  Number falls in past yr: 1 1  Injury with Fall? No No  Risk for fall due to : - Impaired mobility   Depression screen [facility] 2/9 10/09/2016 06/08/2016  Decreased Interest 0 1  Down, Depressed, Hopeless 0 0  PHQ - 2 Score 0 1   Objective: This social met with patient and discharge planner Vickki Hearing  at [facility].  There is no tentative discharge date yet, however the family care planning meeting is scheduled for 10/22/16 at 2 pm.  This Education officer, museum met with patient briefly in her room  Patient was asleep with this Education officer, museum arrived.  Patient concerned as this is her third time in rehab. However is committed to getting strong enough  to return home with active participation in PT and OT.   Plan: This social worker will attend family care planning meeting on 11/12/13/15 at 2pm.

## 2016-10-20 ENCOUNTER — Encounter: Payer: Self-pay | Admitting: *Deleted

## 2016-10-22 ENCOUNTER — Other Ambulatory Visit: Payer: Self-pay | Admitting: *Deleted

## 2016-10-22 NOTE — Patient Outreach (Signed)
[facility]) Care Management  [facility] Social Work  10/22/2016  Jazzlene Huot [facility] 1926/06/05 [id number]  Subjective:  Patient is a 82 year old female currently in short term rehab at [facility]  Objective:   Encounter Medications:  Outpatient Encounter Prescriptions as of 10/22/2016  Medication Sig  . albuterol (PROVENTIL HFA;VENTOLIN HFA) 108 (90 Base) MCG/ACT inhaler Inhale 2 puffs into the lungs every 6 (six) hours as needed for wheezing or shortness of breath.   . ALPRAZolam (XANAX) 0.5 MG tablet Take 1 tablet (0.5 mg total) by mouth at bedtime as needed for anxiety. (Patient taking differently: Take 0.5 mg by mouth at bedtime. )  . Ascorbic Acid (VITAMIN C) 1000 MG tablet Take 1,000 mg by mouth daily.  Marland Kitchen b complex vitamins capsule Take 1 capsule by mouth daily. Reported on 06/08/2016  . Calcium 600-200 MG-UNIT tablet Take 1 tablet by mouth daily.  . cholecalciferol (VITAMIN D) 1000 units tablet Take 1,000 Units by mouth daily.  . Cyanocobalamin (VITAMIN B-12) 5000 MCG SUBL Place 1 tablet (5,000 mcg total) under the tongue daily. Daily (Patient taking differently: Place 1 tablet under the tongue daily. )  . digoxin (LANOXIN) 0.125 MG tablet Take 1 tablet (0.125 mg total) by mouth daily.  Marland Kitchen diltiazem (CARDIZEM) 120 MG tablet Take 1.5 tablets (180 mg total) by mouth daily.  . feeding supplement, ENSURE ENLIVE, (ENSURE ENLIVE) LIQD Take 237 mLs by mouth 2 (two) times daily between meals.  . ferrous sulfate 325 (65 FE) MG tablet Take 1 tablet (325 mg total) by mouth daily with breakfast.  . Fluticasone-Salmeterol (ADVAIR) 500-50 MCG/DOSE AEPB Inhale 1 puff into the lungs 2 (two) times daily.  . furosemide (LASIX) 20 MG tablet Take 1 tablet (20 mg total) by mouth every other day.  . ipratropium-albuterol (DUONEB) 0.5-2.5 (3) MG/3ML SOLN Take 3 mLs by nebulization every 6 (six) hours as needed. For wheezing/shortness of breath  . levofloxacin (LEVAQUIN) 250 MG  tablet Take 1 tablet (250 mg total) by mouth daily.  . Multiple Vitamin (MULTIVITAMIN WITH MINERALS) TABS tablet Take 1 tablet by mouth daily. Reported on 06/08/2016  . omeprazole (PRILOSEC) 40 MG capsule Take 40 mg by mouth daily.  Marland Kitchen oxyCODONE-acetaminophen (PERCOCET/ROXICET) 5-325 MG tablet Take 1 tablet by mouth 2 (two) times daily as needed for severe pain. (Patient taking differently: Take 1 tablet by mouth every morning. )  . pramipexole (MIRAPEX) 0.5 MG tablet Take 0.5 mg by mouth at bedtime.   . vitamin E 400 UNIT capsule Take 400 Units by mouth daily.   No facility-administered encounter medications on file as of 10/22/2016.     Functional Status:  In your present state of health, do you have any difficulty performing the following activities: 10/22/2016 10/09/2016  Hearing? Tempie Donning  Vision? N N  Difficulty concentrating or making decisions? N N  Walking or climbing stairs? Y Y  Dressing or bathing? N N  Doing errands, shopping? Tempie Donning  Preparing Food and eating ? Y -  Using the Toilet? N -  In the past six months, have you accidently leaked urine? Y -  Do you have problems with loss of bowel control? Y -  Managing your Medications? Y -  Managing your Finances? Y -  Housekeeping or managing your Housekeeping? Y -  Some recent data might be hidden    Fall/Depression Screening:  PHQ 2/9 Scores 10/09/2016 06/08/2016  PHQ - 2 Score 0 1    Assessment: This Education officer, museum attended  the care plan meeting to discuss patient's progress in rehab and tentative discharge plan. Patient's daughter present as well as nursing, therapy and social work.  Patient remains on O2 and will continue use at home.  Patient is now on a scheduled pain medication 3x per day. Patient's daughter verbalized concern of the scheduled dose increase  due to increased fatigue and drowsiness observed. It was recommended that patient remain on the scheduled does twice a day with the third being as needed.  Patient's progress in  PT and OT inconsistent. They are working on strengthening and increasing her mobility.  Patient currently completing her ADL's with stand by assist.  The are also working on energy conservation, so that she is not so out of breath when completing her ADL's.  Patient is wanting to go home post discharge from the SNF. Therapy supports this goal, however would like to see more consistency.  Patient lives alone, however her daughter lives close by and visits daily. She also has to grand daughters that assist with care, as well as a family friend that comes by monthly to to the heavy house cleaning.  Patient is not interested in Meals on Wheels. Patient's daughter states that she assists patient with meals as well as manages her medications and finances.  Patient reports having a [place]. Daughter agrees to look into this policy to determine if in home care is covered for additional in home support. The discharge planner also discussed the possibility  Palliative Care consult. Patient and daughter agree to being referred to this program.  She will be assessed while at [facility].  A discharge date has not been set, however patient will discharge with [place] through [facility].  There are no DME needs at this time. Patient's daughter requested to be contacted as well upon patient's discharge to review patient's medications.   Plan:  This social worker will continue to follow patient while in rehab.  This Education officer, museum will follow up in 2 weeks.   [name] [facility] Care Management [phone]  Plan:

## 2016-10-26 ENCOUNTER — Encounter: Payer: Self-pay | Admitting: Intensive Care

## 2016-10-26 ENCOUNTER — Emergency Department
Admission: EM | Admit: 2016-10-26 | Discharge: 2016-10-26 | Disposition: A | Discharge status: Hospice | Payer: PPO | Attending: Emergency Medicine | Admitting: Emergency Medicine

## 2016-10-26 ENCOUNTER — Emergency Department: Payer: PPO

## 2016-10-26 DIAGNOSIS — Z79899 Other long term (current) drug therapy: Secondary | ICD-10-CM | POA: Diagnosis not present

## 2016-10-26 DIAGNOSIS — Z87891 Personal history of nicotine dependence: Secondary | ICD-10-CM | POA: Insufficient documentation

## 2016-10-26 DIAGNOSIS — R0602 Shortness of breath: Secondary | ICD-10-CM | POA: Diagnosis present

## 2016-10-26 DIAGNOSIS — Z85118 Personal history of other malignant neoplasm of bronchus and lung: Secondary | ICD-10-CM | POA: Insufficient documentation

## 2016-10-26 DIAGNOSIS — J449 Chronic obstructive pulmonary disease, unspecified: Secondary | ICD-10-CM | POA: Diagnosis not present

## 2016-10-26 DIAGNOSIS — I509 Heart failure, unspecified: Secondary | ICD-10-CM

## 2016-10-26 MED ORDER — IPRATROPIUM-ALBUTEROL 0.5-2.5 (3) MG/3ML IN SOLN
3.0000 mL | Freq: Once | RESPIRATORY_TRACT | Status: AC
  Administered 2016-10-26: 3 mL via RESPIRATORY_TRACT
  Filled 2016-10-26: qty 3

## 2016-10-26 MED ORDER — MORPHINE SULFATE 10 MG/5ML PO SOLN
5.0000 mg | ORAL | Status: DC | PRN
  Administered 2016-10-26: 5 mg via ORAL
  Filled 2016-10-26 (×2): qty 4

## 2016-10-26 NOTE — ED Triage Notes (Signed)
Pt arrives from [facility] via [facility] for comfort care as breathing has become worse. Pt arrives with DNR and family request for no intubation. Pt able to answer questions in multiple words.

## 2016-10-26 NOTE — ED Provider Notes (Signed)
[facility] Emergency Department Provider Note   ____________________________________________    I have reviewed the triage vital signs and the nursing notes.   HISTORY  Chief Complaint Shortness of Breath     HPI Sara Mccoy is a 82 y.o. female who was brought to the emergency Department for shortness of breath and to arrange for evaluation and possible transfer to hospice. Patient is coming from nursing home and has been having gradually worsening shortness of breath, they were unable to provide any sort of oxygen therapy there. Family wanted the patient taken to the emergency department for evaluation but most importantly for transfer to hospice home for comfort   Past Medical History:  Diagnosis Date  . Anemia   . Anxiety   . COPD (chronic obstructive pulmonary disease) ([place])   . GERD (gastroesophageal reflux disease)   . Lung cancer, upper lobe ([place]) 1997   right upper lobe  . Prolapse of intestine   . Raynaud's disease     Patient Active Problem List   Diagnosis Date Noted  . Atrial fibrillation with RVR ([place]) 10/09/2016  . Leukocytosis 10/09/2016  . COPD (chronic obstructive pulmonary disease) ([place]) 10/09/2016  . GERD (gastroesophageal reflux disease) 10/09/2016  . Anxiety 10/09/2016  . Protein-calorie malnutrition, severe 09/08/2016  . Rectal prolapse 09/05/2016  . Acute respiratory failure ([place]) 07/22/2016  . Symptomatic anemia 07/21/2016  . GI bleed 04/25/2016  . Abdominal mass   . Absolute anemia   . Blood in stool   . Diverticulosis of large intestine without diverticulitis     Past Surgical History:  Procedure Laterality Date  . ABDOMINAL HYSTERECTOMY    . CESAREAN SECTION    . CHOLECYSTECTOMY    . FLEXIBLE SIGMOIDOSCOPY N/A 04/25/2016   Procedure: FLEXIBLE SIGMOIDOSCOPY;  Surgeon: Lucilla Lame, MD;  Location: [facility] ENDOSCOPY;  Service: Endoscopy;  Laterality: N/A;  . JOINT REPLACEMENT    . LUNG SURGERY  Right 1997   RUL removed  . REPAIR OF RECTAL PROLAPSE N/A 09/09/2016   Procedure: REPAIR OF RECTAL PROLAPSE;  Surgeon: Leonie Green, MD;  Location: [facility] ORS;  Service: General;  Laterality: N/A;    Prior to Admission medications   Medication Sig Start Date End Date Taking? Authorizing Provider  albuterol (PROVENTIL HFA;VENTOLIN HFA) 108 (90 Base) MCG/ACT inhaler Inhale 2 puffs into the lungs every 6 (six) hours as needed for wheezing or shortness of breath.    Yes Historical Provider, MD  ALPRAZolam Duanne Moron) 0.5 MG tablet Take 1 tablet (0.5 mg total) by mouth at bedtime as needed for anxiety. 09/11/16  Yes Loletha Grayer, MD  Ascorbic Acid (VITAMIN C) 1000 MG tablet Take 1,000 mg by mouth daily.   Yes Historical Provider, MD  b complex vitamins capsule Take 1 capsule by mouth daily. Reported on 06/08/2016   Yes Historical Provider, MD  Calcium 600-200 MG-UNIT tablet Take 1 tablet by mouth daily.   Yes Historical Provider, MD  cholecalciferol (VITAMIN D) 1000 units tablet Take 1,000 Units by mouth daily.   Yes Historical Provider, MD  Cyanocobalamin (VITAMIN B-12) 5000 MCG SUBL Place 1 tablet (5,000 mcg total) under the tongue daily. Daily 07/27/16  Yes Srikar Sudini, MD  digoxin (LANOXIN) 0.125 MG tablet Take 1 tablet (0.125 mg total) by mouth daily. 10/14/16  Yes Fritzi Mandes, MD  diltiazem (CARDIZEM) 120 MG tablet Take 1.5 tablets (180 mg total) by mouth daily. 10/13/16  Yes Fritzi Mandes, MD  ferrous sulfate 325 (65 FE) MG tablet  Take 1 tablet (325 mg total) by mouth daily with breakfast. 07/27/16  Yes Srikar Sudini, MD  Fluticasone-Salmeterol (ADVAIR) 500-50 MCG/DOSE AEPB Inhale 1 puff into the lungs 2 (two) times daily.   Yes Historical Provider, MD  furosemide (LASIX) 20 MG tablet Take 1 tablet (20 mg total) by mouth every other day. 10/13/16  Yes Fritzi Mandes, MD  loperamide (IMODIUM) 2 MG capsule Take 1 mg by mouth every 4 (four) hours as needed for diarrhea or loose stools.   Yes Historical  Provider, MD  Multiple Vitamin (MULTIVITAMIN WITH MINERALS) TABS tablet Take 1 tablet by mouth daily. Reported on 06/08/2016   Yes Historical Provider, MD  omeprazole (PRILOSEC) 40 MG capsule Take 40 mg by mouth daily.   Yes Historical Provider, MD  oxyCODONE-acetaminophen (PERCOCET/ROXICET) 5-325 MG tablet Take 1 tablet by mouth 2 (two) times daily as needed for severe pain. Patient taking differently: Take 1 tablet by mouth 3 (three) times daily.  09/11/16  Yes Loletha Grayer, MD  vitamin E 400 UNIT capsule Take 400 Units by mouth daily.   Yes Historical Provider, MD  feeding supplement, ENSURE ENLIVE, (ENSURE ENLIVE) LIQD Take 237 mLs by mouth 2 (two) times daily between meals. 10/13/16   Fritzi Mandes, MD  ipratropium-albuterol (DUONEB) 0.5-2.5 (3) MG/3ML SOLN Take 3 mLs by nebulization every 6 (six) hours as needed. For wheezing/shortness of breath    Historical Provider, MD     Allergies Patient has no known allergies.  Family History  Problem Relation Age of Onset  . Heart attack Son   . Diabetes Son   . Diabetes Mother   . Hypertension Daughter     Social History Social History  Substance Use Topics  . Smoking status: Former Smoker    Packs/day: 2.00    Years: 46.00  . Smokeless tobacco: Never Used  . Alcohol use No    Review of Systems  Constitutional: No feverReported     Gastrointestinal: No abdominal pain. Genitourinary: Negative for foul-smelling urine  Skin: Negative for rash.     ____________________________________________   PHYSICAL EXAM:  VITAL SIGNS: ED Triage Vitals  Enc Vitals Group     BP 10/26/16 1011 (!) 129/47     Pulse Rate 10/26/16 1011 73     Resp 10/26/16 1011 (!) 23     Temp 10/26/16 1011 97.4 F (36.3 C)     Temp Source 10/26/16 1011 Oral     SpO2 10/26/16 1011 94 %     Weight 10/26/16 1013 145 lb (65.8 kg)     Height 10/26/16 1013 '5\' 1"'$  (1.549 m)     Head Circumference --      Peak Flow --      Pain Score --      Pain Loc --       Pain Edu? --      Excl. in [place]? --     Constitutional: Alert, Disoriented Eyes: Conjunctivae are normal.  Head: Atraumatic. Nose: No congestion/rhinnorhea. Mouth/Throat: Mucous membranes are dry Cardiovascular: Normal rate, regular rhythm.  Respiratory: Mild tachypnea.  No retractions. Bibasilar rales   Skin:  Skin is warm, dry and intact. No rash noted.   ____________________________________________   LABS (all labs ordered are listed, but only abnormal results are displayed)  Labs Reviewed - No data to display ____________________________________________  EKG   ____________________________________________  RADIOLOGY  Chest x-ray shows mild CHF ____________________________________________   PROCEDURES  Procedure(s) performed: No    Critical Care performed: No ____________________________________________  INITIAL IMPRESSION / ASSESSMENT AND PLAN / ED COURSE  Pertinent labs & imaging results that were available during my care of the patient were reviewed by me and considered in my medical decision making (see chart for details).  Hospice coordinator in with the patient and family, they do not want further evaluation but rather want transfer to hospice home for comfort   ____________________________________________   FINAL CLINICAL IMPRESSION(S) / ED DIAGNOSES  Final diagnoses:  SOB (shortness of breath)  Congestive heart failure, unspecified congestive heart failure chronicity, unspecified congestive heart failure type ([place])      NEW MEDICATIONS STARTED DURING THIS VISIT:  Discharge Medication List as of 10/26/2016 12:05 PM       Note:  This document was prepared using Dragon voice recognition software and may include unintentional dictation errors.     Lavonia Drafts, MD 10/26/16 [phone]

## 2016-10-26 NOTE — Progress Notes (Signed)
New hospice home referral received. Patient is a currently receiving Palliative services at [facility] where she was for short term rehab. Writer had received a phone call from Palliative NP Helyn App regarding her discussion with pateint's daughter [name] prior to pateint's transport this morning. Patient was sent to the [facility] ED this morning in respiratory distress, her daughter would like to focus on her comfort with a transfer to the hospice home.  Patient seen sitting up on the ED stretcher oxygen in place at 4 liters via nasal cannula, nebulizer treatment being given, use of accessory muscles noted. Patient's granddaughter present at bedside , her daughter [name] arrived during visit. Writer met in the family room with [name] to initiate education regarding hospice services, philosophy and team approach to care with good understanding voiced. Consents signed. Patient information faxed to referral. Report called to the hospice home. Writer spoke with patient's daughter, staff RN Seth Bake and attending physician Dr. Corky Downs regarding the use of liquid morphine for management of dyspnea, all in agreement. Patient to receive liquid morphine 5 mg. EMS notified for transport. Family and hospital care team all aware. Signed portable DNR in place. Thank you for the opportunity to be involved in the care of this patient and her family. Flo Shanks RN, BSN, [facility] [place], hospital liaison [phone] c

## 2016-10-26 NOTE — Care Management Note (Signed)
Case Management Note  Patient Details  Name: Sara Mccoy MRN: [id number] Date of Birth: 1926/04/18  Subjective/Objective:    Patient here from [place] with respiratory distress. Having a CXR but prepping to go to [place] per Flo Shanks with [facility]. MD and RN for the patient aware.                Action/Plan:   Expected Discharge Date:                  Expected Discharge Plan:     In-House Referral:     Discharge planning Services     Post Acute Care Choice:    Choice offered to:     DME Arranged:    DME Agency:     HH Arranged:    HH Agency:     Status of Service:     If discussed at [employer] of Stay Meetings, dates discussed:    Additional Comments:  Beau Fanny, RN 10/26/2016, 10:59 AM

## 2016-10-27 ENCOUNTER — Encounter: Payer: Self-pay | Admitting: *Deleted

## 2016-10-27 ENCOUNTER — Other Ambulatory Visit: Payer: Self-pay | Admitting: *Deleted

## 2016-10-27 NOTE — Patient Outreach (Signed)
Plan to close pt's case as view in EMR - hospital case manager's note pt came from [facility] 11/20 with respiratory distress, having a CXR  But prepping to go to [place] per Flo Shanks with [facility].   Pt discharged to Hospice home 11/20.     Plan:   Update provided to [facility] LCSW  11/20 pt to discharge to Hospice home.             Care plan updated.             Plan to fax Dr. Ginette Pitman case closure letter.            Send case closure letter to pt.              Plan to inform [facility] care management assistant to close case.  Zara Chess.   [facility]  [phone]

## 2016-10-27 NOTE — Patient Outreach (Signed)
[place] [facility]) Care Management  10/27/2016  Sara Mccoy [facility] November 02, 1926 [id number]   Plan to close case to [facility] care management  as plan is to discharge to Hospice home on 10/26/16. RNCM previously informed.   [name] [facility] Care Management [phone]

## 2016-11-05 ENCOUNTER — Ambulatory Visit: Payer: Self-pay | Admitting: *Deleted

## 2016-11-06 DEATH — deceased

## 2017-01-08 IMAGING — DX DG CHEST 1V PORT
1 series · 1 of 1 positions shown · non-contrast
Comparison: Portable exam 1081 hours compared to 10/09/2016

CLINICAL DATA: Rows, atrial fibrillation, palpitations, COPD, RIGHT
upper lobe lung cancer, former smoker

EXAM:
PORTABLE CHEST 1 VIEW

[chest ap]
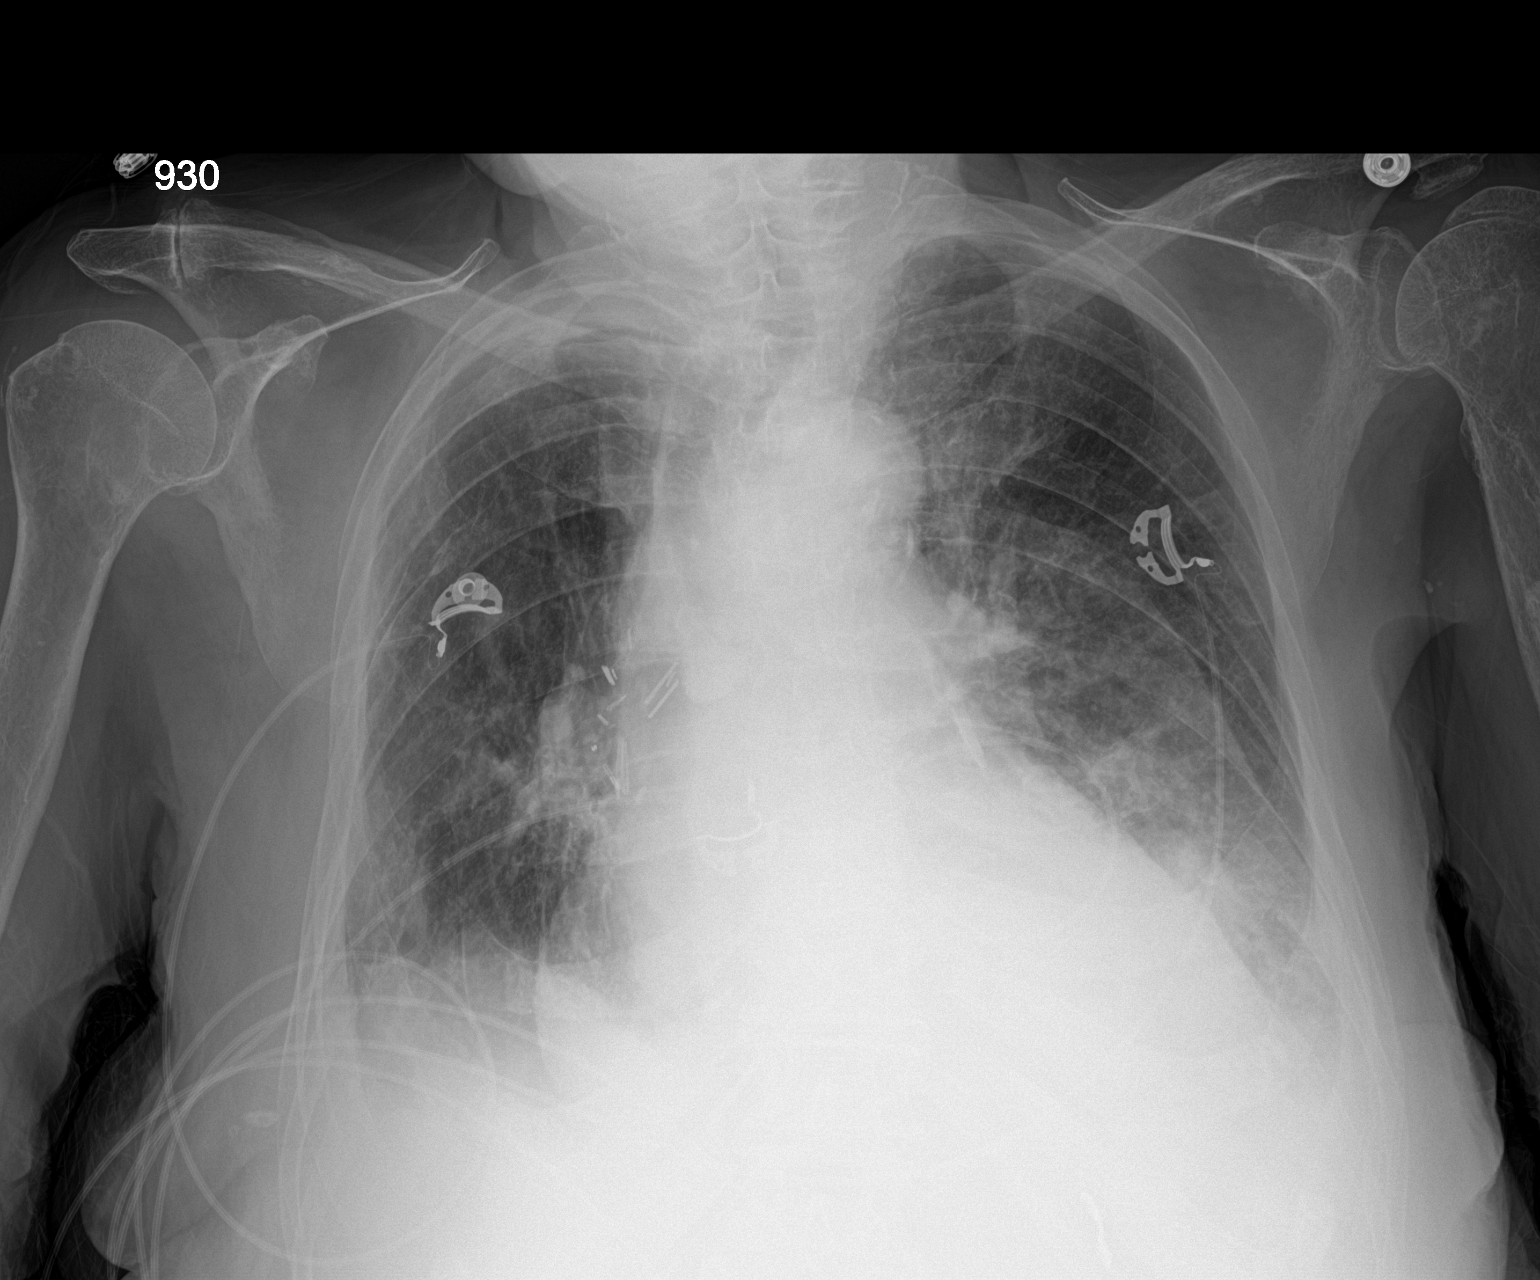

[1 of 1 positions shown; findings below may reference images not displayed]

FINDINGS: Enlargement of cardiac silhouette.

Atherosclerotic calcification aorta. Mediastinal contours and
pulmonary vascularity normal.

New LEFT lower lobe infiltrate question pneumonia though in the
appropriate clinical setting aspiration should be considered.

Underlying COPD changes with postsurgical changes of the RIGHT
hemithorax.

Small BILATERAL pleural effusions.

No pneumothorax.

Diffuse osseous demineralization.
IMPRESSION: Enlargement of cardiac silhouette.

Post thoracotomy changes RIGHT chest with small bibasilar effusions
and new LEFT lower lobe consolidation question pneumonia or, in the
appropriate clinical setting, aspiration.

## 2017-01-09 IMAGING — DX DG CHEST 1V
1 series · 1 of 1 positions shown · non-contrast
Comparison: 10/11/2016

CLINICAL DATA: Hypoxia on BiPAP machine. Previous right upper
lobectomy 0991.

EXAM:
CHEST 1 VIEW

[chest ap]
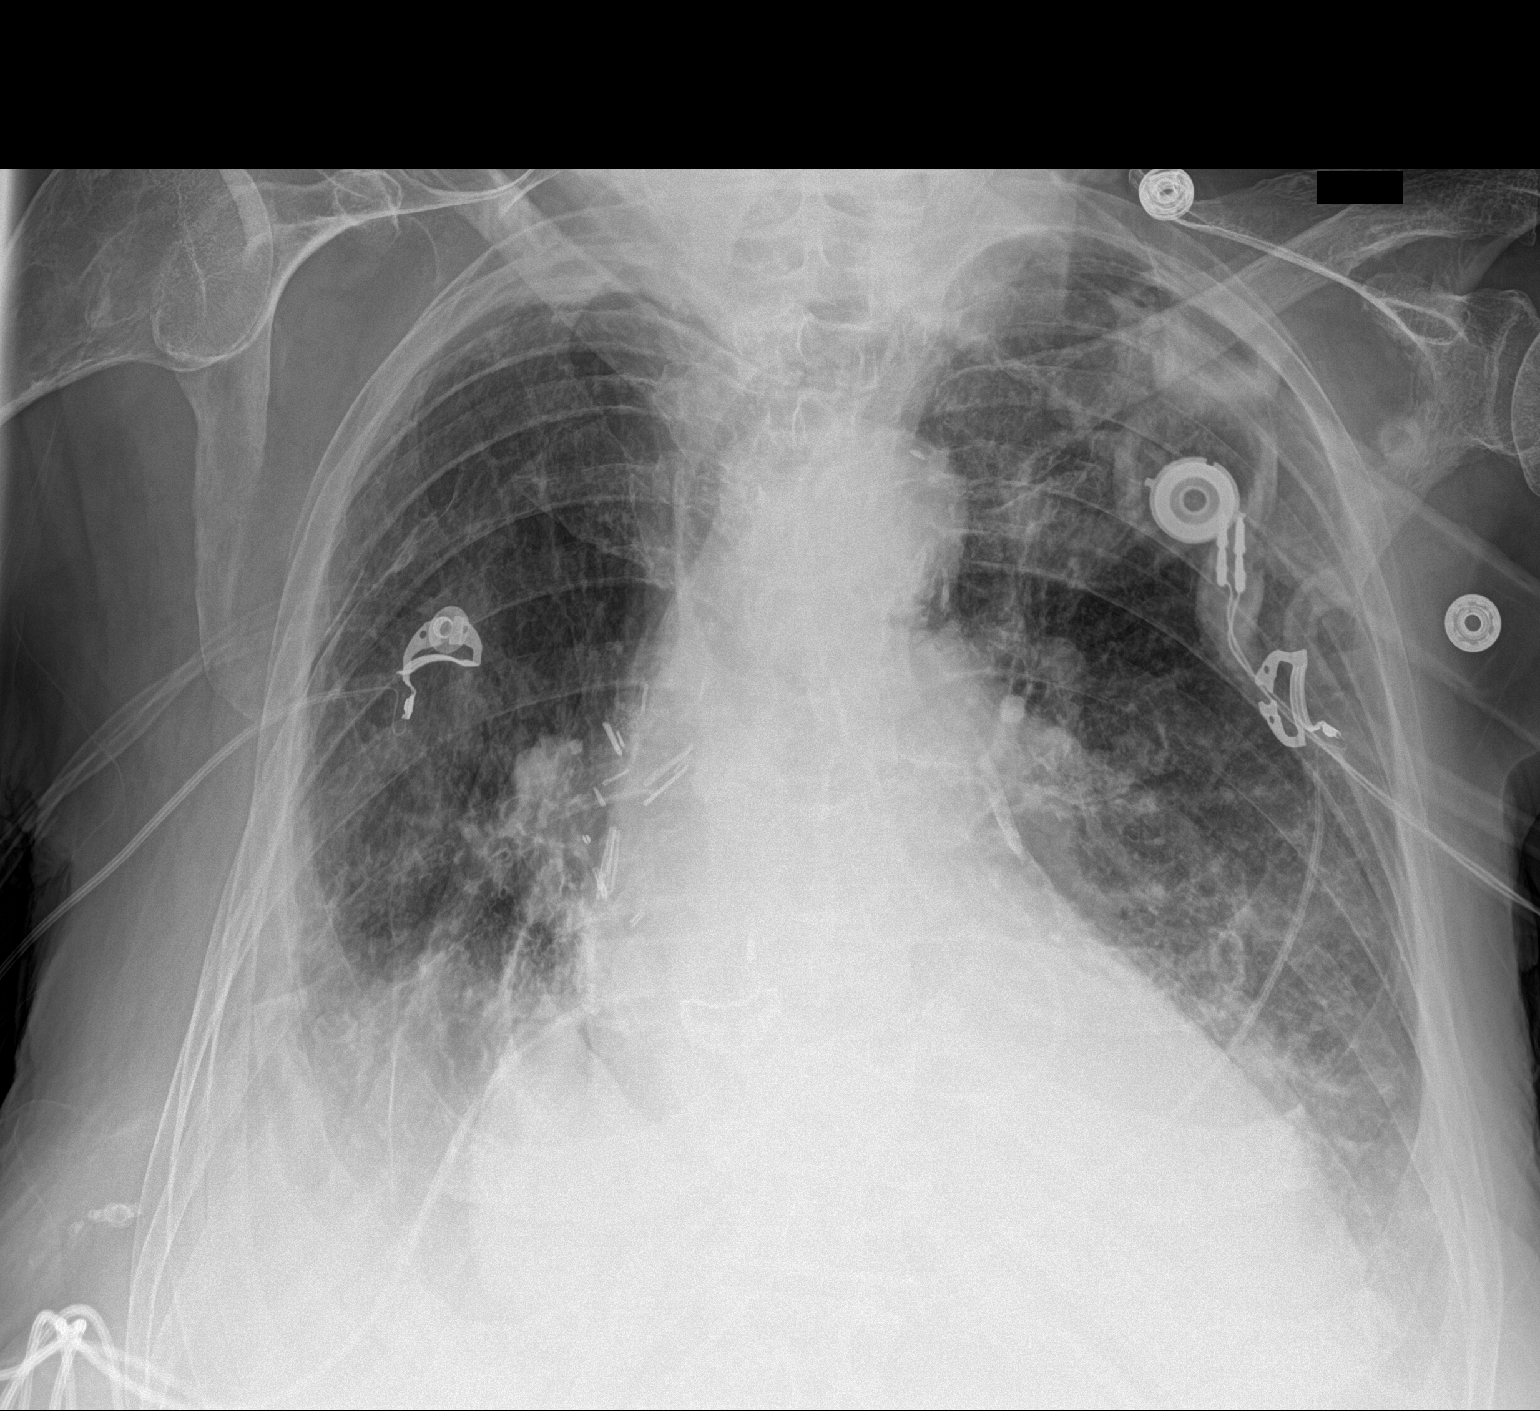

[1 of 1 positions shown; findings below may reference images not displayed]

FINDINGS: Lungs are adequately inflated and demonstrate continued bibasilar
opacification suggesting bilateral effusions with atelectasis,
although cannot exclude infection in the lung bases. Moderate stable
cardiomegaly. Calcified plaque over the thoracic aorta. Degenerative
change of the spine. Post thoracotomy changes of the right chest
wall.
IMPRESSION: Persistent hazy bibasilar opacification likely effusions with
atelectasis, although cannot exclude infection.

Stable moderate cardiomegaly.

## 2017-09-21 LAB — TYPE AND SCREEN
ABO/RH(D): O POS
Antibody Screen: NEGATIVE
Unit division: 0
Unit division: 0
Unit division: 0

## 2017-09-21 LAB — BPAM RBC
Blood Product Expiration Date: 201710112359
Blood Product Expiration Date: 201710182359
Blood Product Expiration Date: 201710182359
ISSUE DATE / TIME: 201710010204
ISSUE DATE / TIME: 201710010617
ISSUE DATE / TIME: 201710021405
Unit Type and Rh: 5100
Unit Type and Rh: 5100
Unit Type and Rh: 5100

## 2017-11-03 IMAGING — US US PELVIS COMPLETE
1 series · 14 of 24 positions shown · non-contrast
Comparison: Abdominal CT 04/07/2016. CT 04/11/2002 not available
for review

CLINICAL DATA: Left ovarian cyst.

EXAM:
TRANSABDOMINAL ULTRASOUND OF PELVIS
TECHNIQUE: Transabdominal ultrasound examination of the pelvis was performed
including evaluation of the uterus, ovaries, adnexal regions, and
pelvic cul-de-sac.

[Series 1: us pelvis complete · 0.21mm/px · 14 of 24 slices shown]
[im 1/24]
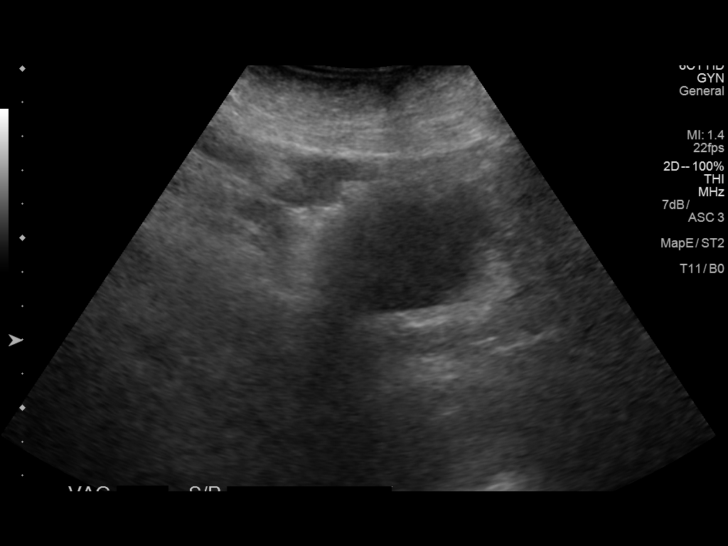
[im 3/24]
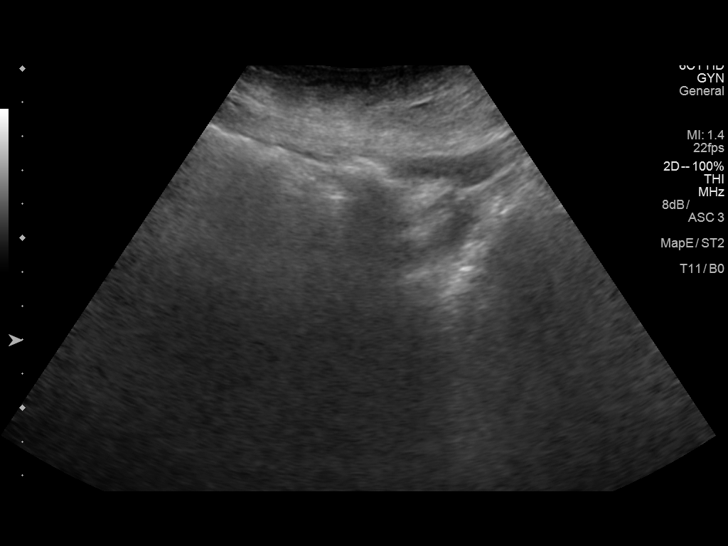
[im 5/24]
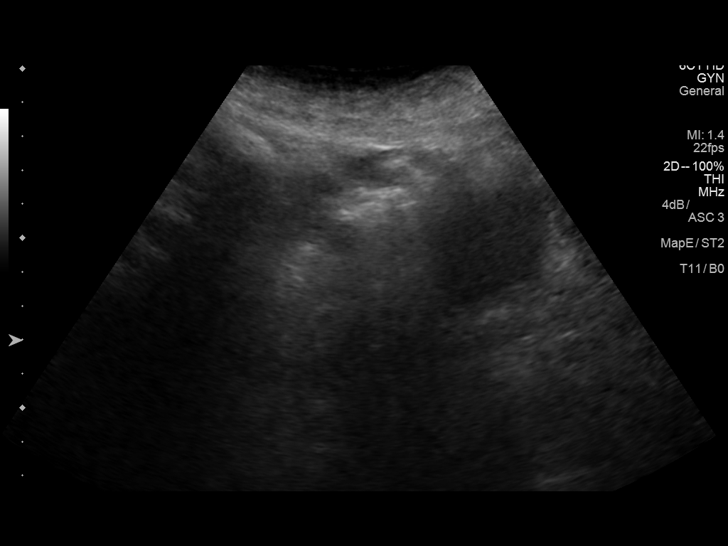
[im 7/24]
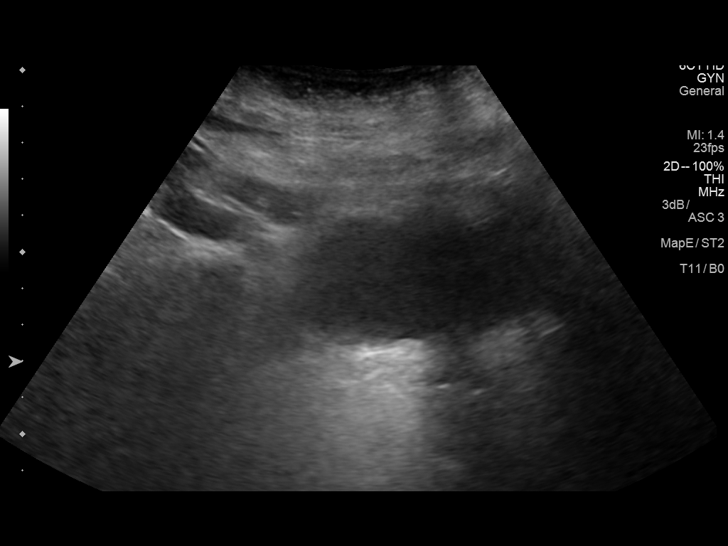
[im 8/24]
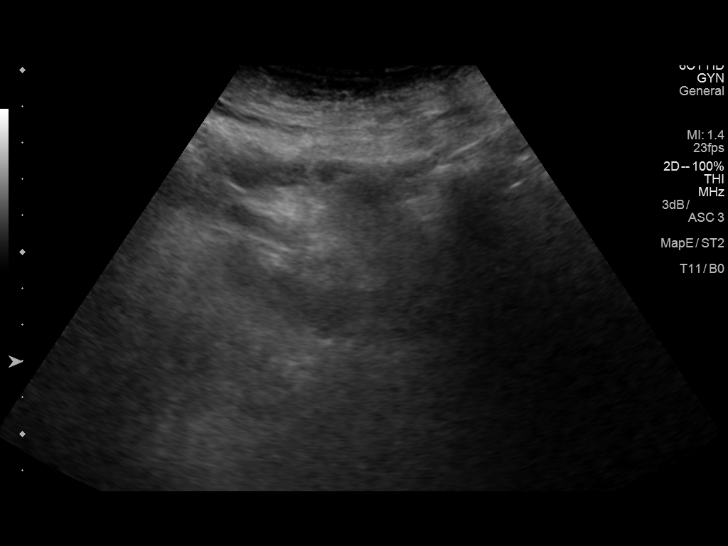
[im 10/24]
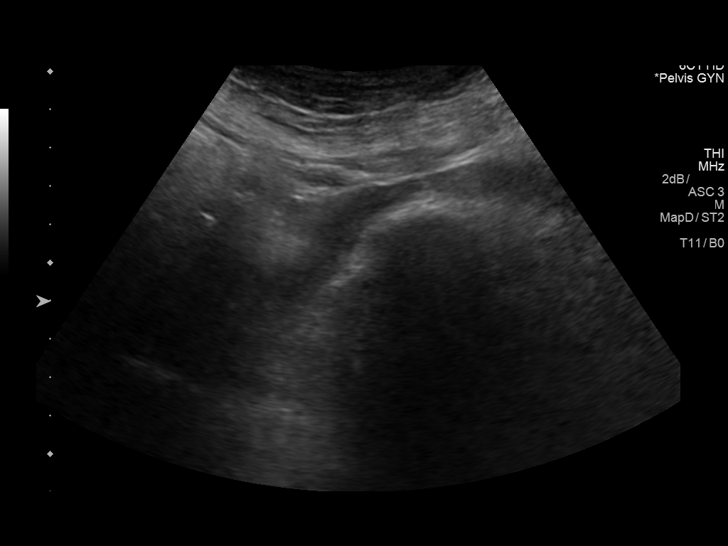
[im 12/24]
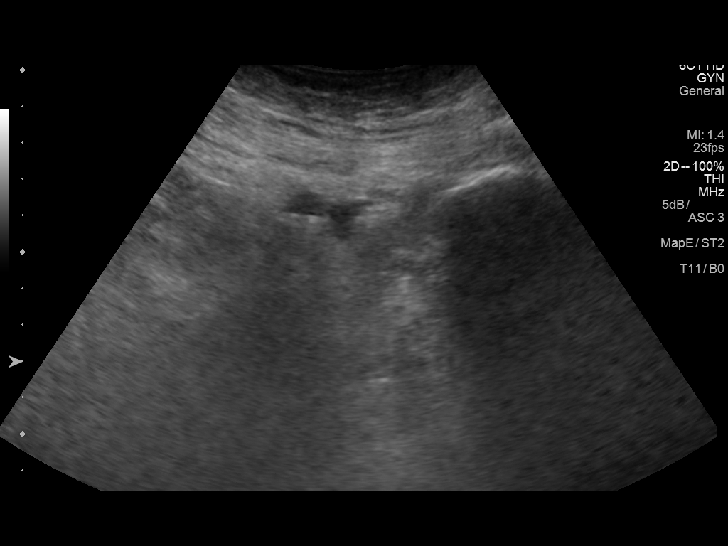
[im 13/24]
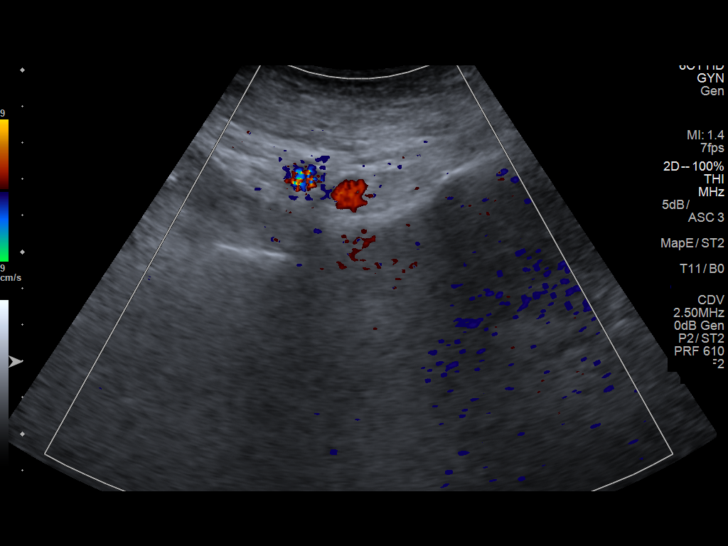
[im 15/24]
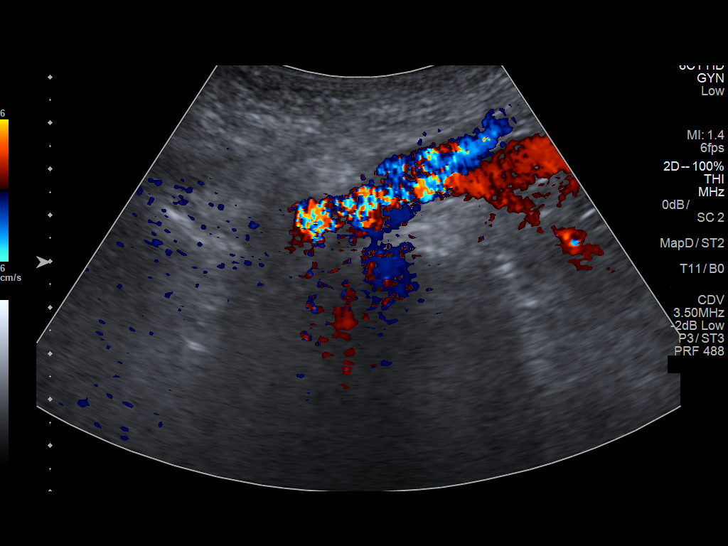
[im 17/24]
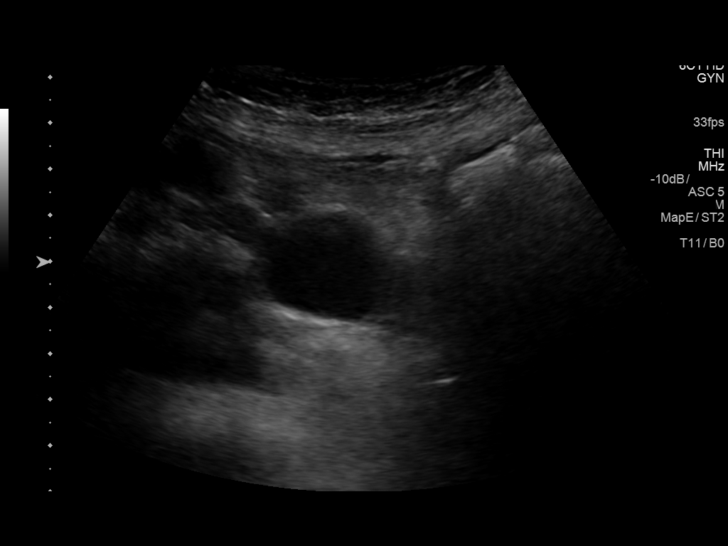
[im 19/24]
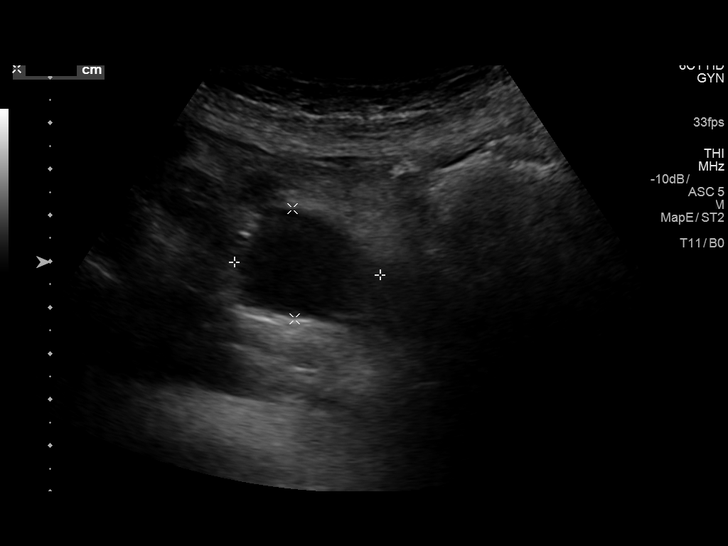
[im 20/24]
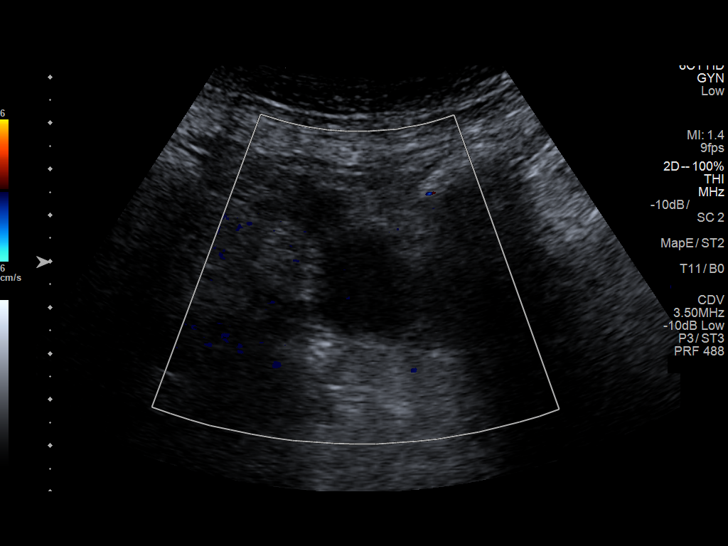
[im 22/24]
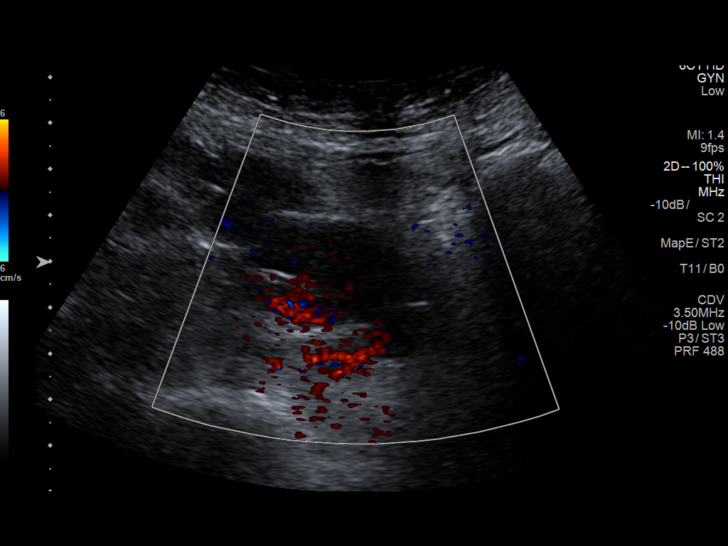
[im 24/24]
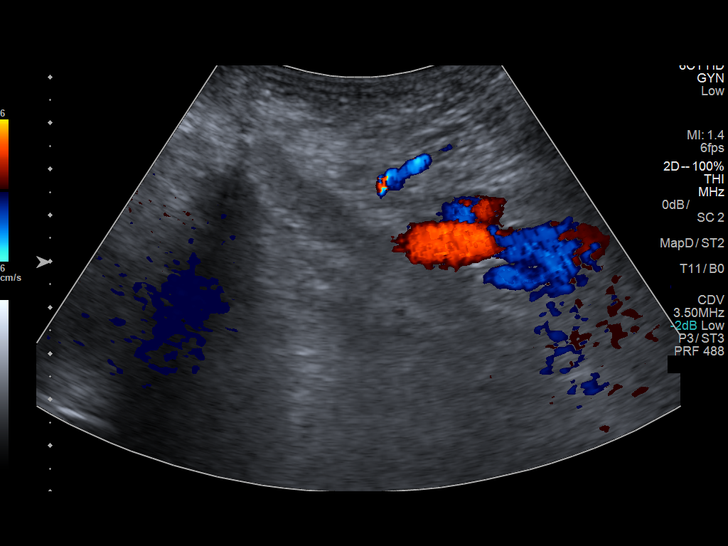

[14 of 24 positions shown; findings below may reference images not displayed]

FINDINGS: Uterus

Surgically absent

Right ovary

Not visualized, normal on previous CT.

Left ovary

In the same location as ovarian cyst seen on previous abdominal CT
there is a similarly sized 32 x 24 x 23 mm simple appearing cyst. No
internal nodularity or septation is noted.

Other findings:  No abnormal free fluid.
IMPRESSION: 32 x 24 x 23 mm left pelvic cyst correlating with ovarian cyst on
April 2016 abdominal CT. The cyst has a simple, benign appearance and
could be followed up in 1 year with ultrasound - if clinically
appropriate.
